# Patient Record
Sex: Female | Born: 1935
Health system: Southern US, Community
[De-identification: ages and names within clinical notes are randomized; demographics above are authoritative.]

## PROBLEM LIST (undated history)

## (undated) DIAGNOSIS — IMO0002 Reserved for concepts with insufficient information to code with codable children: Secondary | ICD-10-CM

## (undated) DIAGNOSIS — F419 Anxiety disorder, unspecified: Secondary | ICD-10-CM

## (undated) DIAGNOSIS — M1990 Unspecified osteoarthritis, unspecified site: Secondary | ICD-10-CM

## (undated) DIAGNOSIS — IMO0001 Reserved for inherently not codable concepts without codable children: Secondary | ICD-10-CM

## (undated) DIAGNOSIS — R911 Solitary pulmonary nodule: Secondary | ICD-10-CM

## (undated) DIAGNOSIS — I10 Essential (primary) hypertension: Secondary | ICD-10-CM

## (undated) DIAGNOSIS — Z9889 Other specified postprocedural states: Secondary | ICD-10-CM

## (undated) DIAGNOSIS — T8859XA Other complications of anesthesia, initial encounter: Secondary | ICD-10-CM

## (undated) DIAGNOSIS — T4145XA Adverse effect of unspecified anesthetic, initial encounter: Secondary | ICD-10-CM

## (undated) DIAGNOSIS — C801 Malignant (primary) neoplasm, unspecified: Secondary | ICD-10-CM

## (undated) DIAGNOSIS — R112 Nausea with vomiting, unspecified: Secondary | ICD-10-CM

## (undated) DIAGNOSIS — H269 Unspecified cataract: Secondary | ICD-10-CM

## (undated) HISTORY — DX: Essential (primary) hypertension: I10

## (undated) HISTORY — PX: TONSILLECTOMY: SUR1361

## (undated) HISTORY — PX: TUBAL LIGATION: SHX77

## (undated) HISTORY — DX: Reserved for inherently not codable concepts without codable children: IMO0001

## (undated) HISTORY — PX: APPENDECTOMY: SHX54

## (undated) HISTORY — DX: Hemochromatosis, unspecified: E83.119

## (undated) HISTORY — PX: EYE SURGERY: SHX253

## (undated) HISTORY — DX: Unspecified cataract: H26.9

## (undated) HISTORY — DX: Reserved for concepts with insufficient information to code with codable children: IMO0002

---

## 1979-05-04 HISTORY — PX: VEIN LIGATION: SHX2652

## 1999-06-21 ENCOUNTER — Other Ambulatory Visit: Admission: RE | Admit: 1999-06-21 | Discharge: 1999-06-21 | Payer: Self-pay | Admitting: Internal Medicine

## 2008-05-13 ENCOUNTER — Ambulatory Visit: Payer: Self-pay | Admitting: Hematology and Oncology

## 2008-06-01 LAB — CBC WITH DIFFERENTIAL/PLATELET
BASO%: 0.4 % (ref 0.0–2.0)
LYMPH%: 29.7 % (ref 14.0–48.0)
MCHC: 34.1 g/dL (ref 32.0–36.0)
MCV: 98.6 fL (ref 81.0–101.0)
MONO%: 11.3 % (ref 0.0–13.0)
Platelets: 153 10*3/uL (ref 145–400)
RBC: 4.15 10*6/uL (ref 3.70–5.32)
RDW: 13.8 % (ref 11.3–14.5)
WBC: 6 10*3/uL (ref 3.9–10.0)

## 2008-06-01 LAB — FERRITIN: Ferritin: 4484 ng/mL — ABNORMAL HIGH (ref 10–291)

## 2008-06-01 LAB — COMPREHENSIVE METABOLIC PANEL
ALT: 46 U/L — ABNORMAL HIGH (ref 0–35)
AST: 79 U/L — ABNORMAL HIGH (ref 0–37)
Albumin: 4.7 g/dL (ref 3.5–5.2)
Alkaline Phosphatase: 74 U/L (ref 39–117)
Potassium: 3.2 mEq/L — ABNORMAL LOW (ref 3.5–5.3)
Sodium: 135 mEq/L (ref 135–145)
Total Bilirubin: 1 mg/dL (ref 0.3–1.2)
Total Protein: 7.6 g/dL (ref 6.0–8.3)

## 2008-06-01 LAB — LACTATE DEHYDROGENASE: LDH: 139 U/L (ref 94–250)

## 2008-06-01 LAB — IRON AND TIBC

## 2008-06-05 ENCOUNTER — Encounter: Admission: RE | Admit: 2008-06-05 | Discharge: 2008-06-05 | Payer: Self-pay | Admitting: Hematology and Oncology

## 2008-06-17 LAB — CBC WITH DIFFERENTIAL/PLATELET
BASO%: 1.9 % (ref 0.0–2.0)
Basophils Absolute: 0.1 10*3/uL (ref 0.0–0.1)
EOS%: 0.6 % (ref 0.0–7.0)
HCT: 36.4 % (ref 34.8–46.6)
HGB: 12.6 g/dL (ref 11.6–15.9)
MCH: 33.9 pg (ref 26.0–34.0)
MCHC: 34.7 g/dL (ref 32.0–36.0)
MONO#: 0.7 10*3/uL (ref 0.1–0.9)
NEUT%: 50 % (ref 39.6–76.8)
RDW: 13.7 % (ref 11.3–14.5)
WBC: 6.2 10*3/uL (ref 3.9–10.0)
lymph#: 2.2 10*3/uL (ref 0.9–3.3)

## 2008-06-21 LAB — CBC WITH DIFFERENTIAL/PLATELET
BASO%: 0.8 % (ref 0.0–2.0)
Basophils Absolute: 0 10*3/uL (ref 0.0–0.1)
EOS%: 1 % (ref 0.0–7.0)
Eosinophils Absolute: 0.1 10*3/uL (ref 0.0–0.5)
HCT: 33.1 % — ABNORMAL LOW (ref 34.8–46.6)
HGB: 11.5 g/dL — ABNORMAL LOW (ref 11.6–15.9)
LYMPH%: 36.4 % (ref 14.0–48.0)
MCH: 33.3 pg (ref 26.0–34.0)
MCHC: 34.9 g/dL (ref 32.0–36.0)
MCV: 95.5 fL (ref 81.0–101.0)
MONO#: 0.9 10*3/uL (ref 0.1–0.9)
MONO%: 15 % — ABNORMAL HIGH (ref 0.0–13.0)
NEUT#: 2.8 10*3/uL (ref 1.5–6.5)
NEUT%: 46.8 % (ref 39.6–76.8)
Platelets: 179 10*3/uL (ref 145–400)
RBC: 3.46 10*6/uL — ABNORMAL LOW (ref 3.70–5.32)
RDW: 12.6 % (ref 11.3–14.5)
WBC: 6 10*3/uL (ref 3.9–10.0)
lymph#: 2.2 10*3/uL (ref 0.9–3.3)

## 2008-06-24 ENCOUNTER — Ambulatory Visit: Payer: Self-pay | Admitting: Hematology and Oncology

## 2008-06-24 LAB — CBC WITH DIFFERENTIAL/PLATELET
Basophils Absolute: 0 10*3/uL (ref 0.0–0.1)
EOS%: 1 % (ref 0.0–7.0)
Eosinophils Absolute: 0.1 10*3/uL (ref 0.0–0.5)
HCT: 31.6 % — ABNORMAL LOW (ref 34.8–46.6)
HGB: 11 g/dL — ABNORMAL LOW (ref 11.6–15.9)
LYMPH%: 34.6 % (ref 14.0–48.0)
MCH: 34.2 pg — ABNORMAL HIGH (ref 26.0–34.0)
MCV: 98.3 fL (ref 81.0–101.0)
MONO%: 12.8 % (ref 0.0–13.0)
NEUT#: 3.1 10*3/uL (ref 1.5–6.5)
NEUT%: 51 % (ref 39.6–76.8)
Platelets: 164 10*3/uL (ref 145–400)
RDW: 14 % (ref 11.3–14.5)

## 2008-06-24 LAB — FERRITIN: Ferritin: 3548 ng/mL — ABNORMAL HIGH (ref 10–291)

## 2008-06-28 LAB — CBC WITH DIFFERENTIAL/PLATELET
BASO%: 0.8 % (ref 0.0–2.0)
Eosinophils Absolute: 0.1 10*3/uL (ref 0.0–0.5)
LYMPH%: 29.4 % (ref 14.0–48.0)
MCHC: 33.7 g/dL (ref 32.0–36.0)
MONO#: 0.9 10*3/uL (ref 0.1–0.9)
NEUT#: 3.4 10*3/uL (ref 1.5–6.5)
Platelets: 187 10*3/uL (ref 145–400)
RBC: 3.3 10*6/uL — ABNORMAL LOW (ref 3.70–5.32)
RDW: 13.5 % (ref 11.3–14.5)
WBC: 6.3 10*3/uL (ref 3.9–10.0)
lymph#: 1.9 10*3/uL (ref 0.9–3.3)

## 2008-06-28 LAB — COMPREHENSIVE METABOLIC PANEL
ALT: 42 U/L — ABNORMAL HIGH (ref 0–35)
Albumin: 4.1 g/dL (ref 3.5–5.2)
CO2: 30 mEq/L (ref 19–32)
Glucose, Bld: 122 mg/dL — ABNORMAL HIGH (ref 70–99)
Potassium: 2.9 mEq/L — ABNORMAL LOW (ref 3.5–5.3)
Sodium: 135 mEq/L (ref 135–145)
Total Bilirubin: 0.6 mg/dL (ref 0.3–1.2)
Total Protein: 6.7 g/dL (ref 6.0–8.3)

## 2008-07-12 LAB — COMPREHENSIVE METABOLIC PANEL
BUN: 11 mg/dL (ref 6–23)
CO2: 23 mEq/L (ref 19–32)
Calcium: 9 mg/dL (ref 8.4–10.5)
Chloride: 102 mEq/L (ref 96–112)
Creatinine, Ser: 0.7 mg/dL (ref 0.40–1.20)
Glucose, Bld: 114 mg/dL — ABNORMAL HIGH (ref 70–99)

## 2008-07-12 LAB — CBC WITH DIFFERENTIAL/PLATELET
Basophils Absolute: 0.1 10*3/uL (ref 0.0–0.1)
EOS%: 1.9 % (ref 0.0–7.0)
Eosinophils Absolute: 0.1 10*3/uL (ref 0.0–0.5)
HCT: 33.3 % — ABNORMAL LOW (ref 34.8–46.6)
HGB: 11.2 g/dL — ABNORMAL LOW (ref 11.6–15.9)
MCH: 34.3 pg — ABNORMAL HIGH (ref 26.0–34.0)
MONO#: 0.6 10*3/uL (ref 0.1–0.9)
NEUT%: 45.1 % (ref 39.6–76.8)
lymph#: 2.1 10*3/uL (ref 0.9–3.3)

## 2008-07-12 LAB — FERRITIN: Ferritin: 2152 ng/mL — ABNORMAL HIGH (ref 10–291)

## 2008-07-26 LAB — CBC WITH DIFFERENTIAL/PLATELET
BASO%: 0.4 % (ref 0.0–2.0)
Eosinophils Absolute: 0.1 10*3/uL (ref 0.0–0.5)
LYMPH%: 38 % (ref 14.0–48.0)
MCHC: 33.7 g/dL (ref 32.0–36.0)
MONO#: 0.7 10*3/uL (ref 0.1–0.9)
NEUT#: 2.2 10*3/uL (ref 1.5–6.5)
RBC: 3.45 10*6/uL — ABNORMAL LOW (ref 3.70–5.32)
RDW: 15 % — ABNORMAL HIGH (ref 11.3–14.5)
WBC: 4.9 10*3/uL (ref 3.9–10.0)

## 2008-07-27 LAB — FERRITIN: Ferritin: 2594 ng/mL — ABNORMAL HIGH (ref 10–291)

## 2008-08-04 ENCOUNTER — Ambulatory Visit: Payer: Self-pay | Admitting: Hematology and Oncology

## 2008-08-09 LAB — CBC WITH DIFFERENTIAL/PLATELET
BASO%: 0.5 % (ref 0.0–2.0)
EOS%: 1.8 % (ref 0.0–7.0)
HCT: 37.8 % (ref 34.8–46.6)
LYMPH%: 29.4 % (ref 14.0–48.0)
MCH: 34.6 pg — ABNORMAL HIGH (ref 26.0–34.0)
MCHC: 33.7 g/dL (ref 32.0–36.0)
NEUT%: 54.1 % (ref 39.6–76.8)
RBC: 3.68 10*6/uL — ABNORMAL LOW (ref 3.70–5.32)
WBC: 5.6 10*3/uL (ref 3.9–10.0)
lymph#: 1.6 10*3/uL (ref 0.9–3.3)

## 2008-08-10 LAB — COMPREHENSIVE METABOLIC PANEL
BUN: 12 mg/dL (ref 6–23)
CO2: 24 mEq/L (ref 19–32)
Creatinine, Ser: 0.72 mg/dL (ref 0.40–1.20)
Glucose, Bld: 114 mg/dL — ABNORMAL HIGH (ref 70–99)
Total Bilirubin: 0.4 mg/dL (ref 0.3–1.2)
Total Protein: 6.8 g/dL (ref 6.0–8.3)

## 2008-08-10 LAB — FERRITIN: Ferritin: 1824 ng/mL — ABNORMAL HIGH (ref 10–291)

## 2008-08-22 ENCOUNTER — Ambulatory Visit: Payer: Self-pay | Admitting: Hematology and Oncology

## 2008-08-23 LAB — CBC WITH DIFFERENTIAL/PLATELET
BASO%: 0.9 % (ref 0.0–2.0)
HCT: 38.2 % (ref 34.8–46.6)
HGB: 13.1 g/dL (ref 11.6–15.9)
MCHC: 34.4 g/dL (ref 32.0–36.0)
MONO#: 0.9 10*3/uL (ref 0.1–0.9)
NEUT%: 46.8 % (ref 39.6–76.8)
WBC: 5.7 10*3/uL (ref 3.9–10.0)
lymph#: 2 10*3/uL (ref 0.9–3.3)

## 2008-09-13 LAB — CBC WITH DIFFERENTIAL/PLATELET
Basophils Absolute: 0 10*3/uL (ref 0.0–0.1)
EOS%: 0.7 % (ref 0.0–7.0)
Eosinophils Absolute: 0.1 10*3/uL (ref 0.0–0.5)
HCT: 38.6 % (ref 34.8–46.6)
HGB: 13.3 g/dL (ref 11.6–15.9)
LYMPH%: 23.9 % (ref 14.0–48.0)
MCH: 34.2 pg — ABNORMAL HIGH (ref 26.0–34.0)
MCV: 99.3 fL (ref 81.0–101.0)
MONO%: 15.2 % — ABNORMAL HIGH (ref 0.0–13.0)
NEUT#: 4.7 10*3/uL (ref 1.5–6.5)
NEUT%: 59.9 % (ref 39.6–76.8)
Platelets: 164 10*3/uL (ref 145–400)
RDW: 13.6 % (ref 11.3–14.5)

## 2008-09-27 ENCOUNTER — Ambulatory Visit: Payer: Self-pay | Admitting: Hematology and Oncology

## 2008-09-27 LAB — CBC WITH DIFFERENTIAL/PLATELET
BASO%: 0.6 % (ref 0.0–2.0)
HCT: 38.9 % (ref 34.8–46.6)
LYMPH%: 25.2 % (ref 14.0–48.0)
MCH: 34.3 pg — ABNORMAL HIGH (ref 26.0–34.0)
MCHC: 34.2 g/dL (ref 32.0–36.0)
MCV: 100.1 fL (ref 81.0–101.0)
MONO#: 1 10*3/uL — ABNORMAL HIGH (ref 0.1–0.9)
MONO%: 11.6 % (ref 0.0–13.0)
NEUT%: 62.2 % (ref 39.6–76.8)
Platelets: 153 10*3/uL (ref 145–400)
RBC: 3.89 10*6/uL (ref 3.70–5.32)
WBC: 8.6 10*3/uL (ref 3.9–10.0)

## 2008-10-11 LAB — COMPREHENSIVE METABOLIC PANEL
ALT: 32 U/L (ref 0–35)
AST: 60 U/L — ABNORMAL HIGH (ref 0–37)
Albumin: 4.1 g/dL (ref 3.5–5.2)
Alkaline Phosphatase: 100 U/L (ref 39–117)
BUN: 10 mg/dL (ref 6–23)
CO2: 24 mEq/L (ref 19–32)
Calcium: 8.8 mg/dL (ref 8.4–10.5)
Chloride: 103 mEq/L (ref 96–112)
Creatinine, Ser: 0.71 mg/dL (ref 0.40–1.20)
Glucose, Bld: 126 mg/dL — ABNORMAL HIGH (ref 70–99)
Potassium: 3.7 mEq/L (ref 3.5–5.3)
Sodium: 140 mEq/L (ref 135–145)
Total Bilirubin: 0.4 mg/dL (ref 0.3–1.2)
Total Protein: 7.1 g/dL (ref 6.0–8.3)

## 2008-10-11 LAB — CBC WITH DIFFERENTIAL/PLATELET
Eosinophils Absolute: 0.1 10*3/uL (ref 0.0–0.5)
LYMPH%: 31.6 % (ref 14.0–48.0)
MONO#: 0.8 10*3/uL (ref 0.1–0.9)
NEUT#: 3.8 10*3/uL (ref 1.5–6.5)
Platelets: 174 10*3/uL (ref 145–400)
RBC: 3.76 10*6/uL (ref 3.70–5.32)
WBC: 6.8 10*3/uL (ref 3.9–10.0)

## 2008-10-11 LAB — IRON AND TIBC
Iron: 264 ug/dL — ABNORMAL HIGH (ref 42–145)
UIBC: 23 ug/dL

## 2008-10-17 LAB — CBC WITH DIFFERENTIAL/PLATELET
BASO%: 0.8 % (ref 0.0–2.0)
Basophils Absolute: 0.1 10*3/uL (ref 0.0–0.1)
EOS%: 1 % (ref 0.0–7.0)
HCT: 35.7 % (ref 34.8–46.6)
HGB: 12.2 g/dL (ref 11.6–15.9)
MONO#: 0.9 10*3/uL (ref 0.1–0.9)
NEUT%: 60.4 % (ref 39.6–76.8)
RDW: 15 % — ABNORMAL HIGH (ref 11.3–14.5)
WBC: 7.3 10*3/uL (ref 3.9–10.0)
lymph#: 1.8 10*3/uL (ref 0.9–3.3)

## 2008-10-25 LAB — CBC WITH DIFFERENTIAL/PLATELET
Basophils Absolute: 0 10*3/uL (ref 0.0–0.1)
EOS%: 0.7 % (ref 0.0–7.0)
Eosinophils Absolute: 0 10*3/uL (ref 0.0–0.5)
HCT: 36.9 % (ref 34.8–46.6)
HGB: 12.6 g/dL (ref 11.6–15.9)
MCH: 34.5 pg — ABNORMAL HIGH (ref 25.1–34.0)
MCV: 100.6 fL (ref 79.5–101.0)
NEUT#: 3.2 10*3/uL (ref 1.5–6.5)
NEUT%: 51.3 % (ref 38.4–76.8)
RDW: 14.9 % — ABNORMAL HIGH (ref 11.2–14.5)
lymph#: 2.2 10*3/uL (ref 0.9–3.3)

## 2008-11-08 LAB — CBC WITH DIFFERENTIAL/PLATELET
EOS%: 1.3 % (ref 0.0–7.0)
Eosinophils Absolute: 0.1 10*3/uL (ref 0.0–0.5)
MCH: 32.8 pg (ref 25.1–34.0)
MCV: 97.5 fL (ref 79.5–101.0)
MONO%: 14.4 % — ABNORMAL HIGH (ref 0.0–14.0)
NEUT#: 3 10*3/uL (ref 1.5–6.5)
RBC: 3.93 10*6/uL (ref 3.70–5.45)
RDW: 13.7 % (ref 11.2–14.5)
lymph#: 2.3 10*3/uL (ref 0.9–3.3)
nRBC: 0 % (ref 0–0)

## 2008-11-18 ENCOUNTER — Ambulatory Visit: Payer: Self-pay | Admitting: Hematology and Oncology

## 2008-11-22 LAB — CBC WITH DIFFERENTIAL/PLATELET
BASO%: 1.1 % (ref 0.0–2.0)
Basophils Absolute: 0.1 10*3/uL (ref 0.0–0.1)
EOS%: 1.1 % (ref 0.0–7.0)
Eosinophils Absolute: 0.1 10*3/uL (ref 0.0–0.5)
HCT: 36.3 % (ref 34.8–46.6)
HGB: 12.3 g/dL (ref 11.6–15.9)
LYMPH%: 36.6 % (ref 14.0–49.7)
MCH: 34.5 pg — ABNORMAL HIGH (ref 25.1–34.0)
MCHC: 34 g/dL (ref 31.5–36.0)
MCV: 101.6 fL — ABNORMAL HIGH (ref 79.5–101.0)
MONO#: 0.8 10*3/uL (ref 0.1–0.9)
MONO%: 14 % (ref 0.0–14.0)
NEUT#: 2.8 10*3/uL (ref 1.5–6.5)
NEUT%: 47.2 % (ref 38.4–76.8)
Platelets: 146 10*3/uL (ref 145–400)
RBC: 3.58 10*6/uL — ABNORMAL LOW (ref 3.70–5.45)
RDW: 14.9 % — ABNORMAL HIGH (ref 11.2–14.5)
WBC: 5.9 10*3/uL (ref 3.9–10.3)
lymph#: 2.2 10*3/uL (ref 0.9–3.3)

## 2008-12-06 LAB — CBC WITH DIFFERENTIAL/PLATELET
Basophils Absolute: 0 10*3/uL (ref 0.0–0.1)
Eosinophils Absolute: 0.1 10*3/uL (ref 0.0–0.5)
HCT: 37.4 % (ref 34.8–46.6)
LYMPH%: 38.5 % (ref 14.0–49.7)
MCV: 97.4 fL (ref 79.5–101.0)
MONO#: 0.9 10*3/uL (ref 0.1–0.9)
MONO%: 14 % (ref 0.0–14.0)
NEUT#: 3 10*3/uL (ref 1.5–6.5)
NEUT%: 46.1 % (ref 38.4–76.8)
Platelets: 158 10*3/uL (ref 145–400)
WBC: 6.4 10*3/uL (ref 3.9–10.3)

## 2008-12-20 LAB — BASIC METABOLIC PANEL
CO2: 25 mEq/L (ref 19–32)
Calcium: 8.8 mg/dL (ref 8.4–10.5)
Creatinine, Ser: 0.86 mg/dL (ref 0.40–1.20)

## 2008-12-20 LAB — CBC WITH DIFFERENTIAL/PLATELET
BASO%: 0.6 % (ref 0.0–2.0)
HCT: 37 % (ref 34.8–46.6)
LYMPH%: 37.4 % (ref 14.0–49.7)
MCH: 34.4 pg — ABNORMAL HIGH (ref 25.1–34.0)
MCHC: 34.1 g/dL (ref 31.5–36.0)
MCV: 101.1 fL — ABNORMAL HIGH (ref 79.5–101.0)
MONO#: 0.7 10*3/uL (ref 0.1–0.9)
MONO%: 13.7 % (ref 0.0–14.0)
NEUT%: 47.1 % (ref 38.4–76.8)
Platelets: 149 10*3/uL (ref 145–400)

## 2008-12-20 LAB — FERRITIN: Ferritin: 1268 ng/mL — ABNORMAL HIGH (ref 10–291)

## 2009-01-03 ENCOUNTER — Ambulatory Visit: Payer: Self-pay | Admitting: Hematology and Oncology

## 2009-01-04 ENCOUNTER — Encounter: Admission: RE | Admit: 2009-01-04 | Discharge: 2009-01-04 | Payer: Self-pay | Admitting: Family Medicine

## 2009-01-17 LAB — CBC WITH DIFFERENTIAL/PLATELET
Basophils Absolute: 0 10*3/uL (ref 0.0–0.1)
Eosinophils Absolute: 0.2 10*3/uL (ref 0.0–0.5)
HCT: 40.4 % (ref 34.8–46.6)
HGB: 14 g/dL (ref 11.6–15.9)
MCH: 33.5 pg (ref 25.1–34.0)
MCV: 96.7 fL (ref 79.5–101.0)
NEUT#: 2.7 10*3/uL (ref 1.5–6.5)
NEUT%: 42.6 % (ref 38.4–76.8)
RDW: 13.3 % (ref 11.2–14.5)
lymph#: 2.6 10*3/uL (ref 0.9–3.3)

## 2009-01-31 LAB — CBC WITH DIFFERENTIAL/PLATELET
BASO%: 0.4 % (ref 0.0–2.0)
Eosinophils Absolute: 0.1 10*3/uL (ref 0.0–0.5)
HCT: 36 % (ref 34.8–46.6)
LYMPH%: 42.1 % (ref 14.0–49.7)
MCHC: 35 g/dL (ref 31.5–36.0)
MCV: 97.3 fL (ref 79.5–101.0)
MONO#: 0.8 10*3/uL (ref 0.1–0.9)
MONO%: 14.4 % — ABNORMAL HIGH (ref 0.0–14.0)
NEUT%: 40.5 % (ref 38.4–76.8)
Platelets: 149 10*3/uL (ref 145–400)
WBC: 5.4 10*3/uL (ref 3.9–10.3)

## 2009-02-10 LAB — BASIC METABOLIC PANEL
Calcium: 8.9 mg/dL (ref 8.4–10.5)
Creatinine, Ser: 0.82 mg/dL (ref 0.40–1.20)

## 2009-02-10 LAB — CBC WITH DIFFERENTIAL/PLATELET
BASO%: 0.5 % (ref 0.0–2.0)
EOS%: 2.1 % (ref 0.0–7.0)
HCT: 34.9 % (ref 34.8–46.6)
LYMPH%: 37.3 % (ref 14.0–49.7)
MCH: 35.8 pg — ABNORMAL HIGH (ref 25.1–34.0)
MCHC: 34.7 g/dL (ref 31.5–36.0)
NEUT%: 46.8 % (ref 38.4–76.8)
Platelets: 158 10*3/uL (ref 145–400)
RBC: 3.38 10*6/uL — ABNORMAL LOW (ref 3.70–5.45)

## 2009-02-10 LAB — IRON AND TIBC
Iron: 276 ug/dL — ABNORMAL HIGH (ref 42–145)
UIBC: <55 ug/dL

## 2009-02-10 LAB — FERRITIN: Ferritin: 1105 ng/mL — ABNORMAL HIGH (ref 10–291)

## 2009-02-17 ENCOUNTER — Ambulatory Visit: Payer: Self-pay | Admitting: Hematology and Oncology

## 2009-03-10 LAB — COMPREHENSIVE METABOLIC PANEL
ALT: 41 U/L — ABNORMAL HIGH (ref 0–35)
Alkaline Phosphatase: 94 U/L (ref 39–117)
Creatinine, Ser: 0.77 mg/dL (ref 0.40–1.20)
Sodium: 138 mEq/L (ref 135–145)
Total Bilirubin: 0.7 mg/dL (ref 0.3–1.2)
Total Protein: 7.1 g/dL (ref 6.0–8.3)

## 2009-03-10 LAB — CBC WITH DIFFERENTIAL/PLATELET
BASO%: 1.1 % (ref 0.0–2.0)
EOS%: 1.8 % (ref 0.0–7.0)
LYMPH%: 36.1 % (ref 14.0–49.7)
MCH: 34.8 pg — ABNORMAL HIGH (ref 25.1–34.0)
MCHC: 33.7 g/dL (ref 31.5–36.0)
MCV: 103.4 fL — ABNORMAL HIGH (ref 79.5–101.0)
MONO%: 13.9 % (ref 0.0–14.0)
Platelets: 149 10*3/uL (ref 145–400)
RBC: 3.8 10*6/uL (ref 3.70–5.45)
WBC: 5.3 10*3/uL (ref 3.9–10.3)

## 2009-03-17 ENCOUNTER — Ambulatory Visit: Payer: Self-pay | Admitting: Hematology and Oncology

## 2009-04-06 LAB — COMPREHENSIVE METABOLIC PANEL
BUN: 18 mg/dL (ref 6–23)
CO2: 23 mEq/L (ref 19–32)
Calcium: 9.3 mg/dL (ref 8.4–10.5)
Chloride: 105 mEq/L (ref 96–112)
Creatinine, Ser: 0.91 mg/dL (ref 0.40–1.20)
Glucose, Bld: 109 mg/dL — ABNORMAL HIGH (ref 70–99)

## 2009-04-06 LAB — CBC WITH DIFFERENTIAL/PLATELET
Basophils Absolute: 0 10*3/uL (ref 0.0–0.1)
HCT: 38.9 % (ref 34.8–46.6)
HGB: 13.2 g/dL (ref 11.6–15.9)
MONO#: 0.7 10*3/uL (ref 0.1–0.9)
NEUT%: 48.7 % (ref 38.4–76.8)
Platelets: 129 10*3/uL — ABNORMAL LOW (ref 145–400)
WBC: 5.2 10*3/uL (ref 3.9–10.3)
lymph#: 1.8 10*3/uL (ref 0.9–3.3)

## 2009-04-06 LAB — IRON AND TIBC: UIBC: <55 ug/dL

## 2009-04-06 LAB — FERRITIN: Ferritin: 1280 ng/mL — ABNORMAL HIGH (ref 10–291)

## 2009-04-12 ENCOUNTER — Ambulatory Visit: Payer: Self-pay | Admitting: Hematology and Oncology

## 2009-04-25 LAB — CBC WITH DIFFERENTIAL/PLATELET
Basophils Absolute: 0 10*3/uL (ref 0.0–0.1)
EOS%: 2 % (ref 0.0–7.0)
HGB: 13.8 g/dL (ref 11.6–15.9)
LYMPH%: 36.9 % (ref 14.0–49.7)
MCH: 33.3 pg (ref 25.1–34.0)
MCV: 96.1 fL (ref 79.5–101.0)
MONO%: 12.2 % (ref 0.0–14.0)
RDW: 13.2 % (ref 11.2–14.5)

## 2009-05-04 ENCOUNTER — Ambulatory Visit: Payer: Self-pay | Admitting: Oncology

## 2009-05-09 ENCOUNTER — Ambulatory Visit (HOSPITAL_COMMUNITY): Admission: RE | Admit: 2009-05-09 | Discharge: 2009-05-09 | Payer: Self-pay | Admitting: Hematology and Oncology

## 2009-05-09 LAB — CBC WITH DIFFERENTIAL/PLATELET
BASO%: 0.4 % (ref 0.0–2.0)
Basophils Absolute: 0 10*3/uL (ref 0.0–0.1)
Eosinophils Absolute: 0.1 10*3/uL (ref 0.0–0.5)
HCT: 40 % (ref 34.8–46.6)
HGB: 13.5 g/dL (ref 11.6–15.9)
LYMPH%: 34.5 % (ref 14.0–49.7)
MONO#: 0.6 10*3/uL (ref 0.1–0.9)
NEUT%: 50.4 % (ref 38.4–76.8)
Platelets: 133 10*3/uL — ABNORMAL LOW (ref 145–400)
WBC: 4.6 10*3/uL (ref 3.9–10.3)
lymph#: 1.6 10*3/uL (ref 0.9–3.3)

## 2009-05-09 LAB — COMPREHENSIVE METABOLIC PANEL
ALT: 38 U/L — ABNORMAL HIGH (ref 0–35)
BUN: 12 mg/dL (ref 6–23)
CO2: 22 mEq/L (ref 19–32)
Calcium: 9.3 mg/dL (ref 8.4–10.5)
Chloride: 105 mEq/L (ref 96–112)
Creatinine, Ser: 0.67 mg/dL (ref 0.40–1.20)
Glucose, Bld: 101 mg/dL — ABNORMAL HIGH (ref 70–99)

## 2009-05-09 LAB — AFP TUMOR MARKER: AFP-Tumor Marker: 1.4 ng/mL (ref 0.0–8.0)

## 2009-05-19 LAB — CBC WITH DIFFERENTIAL/PLATELET
Basophils Absolute: 0 10*3/uL (ref 0.0–0.1)
Eosinophils Absolute: 0.1 10*3/uL (ref 0.0–0.5)
HCT: 38 % (ref 34.8–46.6)
HGB: 13.1 g/dL (ref 11.6–15.9)
LYMPH%: 39.7 % (ref 14.0–49.7)
MCV: 96.2 fL (ref 79.5–101.0)
MONO#: 0.8 10*3/uL (ref 0.1–0.9)
MONO%: 13.4 % (ref 0.0–14.0)
NEUT#: 2.7 10*3/uL (ref 1.5–6.5)
Platelets: 146 10*3/uL (ref 145–400)

## 2009-05-19 LAB — FERRITIN: Ferritin: 1402 ng/mL — ABNORMAL HIGH (ref 10–291)

## 2009-05-19 LAB — COMPREHENSIVE METABOLIC PANEL
Albumin: 4.4 g/dL (ref 3.5–5.2)
Alkaline Phosphatase: 84 U/L (ref 39–117)
BUN: 17 mg/dL (ref 6–23)
CO2: 22 mEq/L (ref 19–32)
Glucose, Bld: 100 mg/dL — ABNORMAL HIGH (ref 70–99)
Potassium: 4 mEq/L (ref 3.5–5.3)
Total Bilirubin: 0.8 mg/dL (ref 0.3–1.2)

## 2009-05-19 LAB — LACTATE DEHYDROGENASE: LDH: 148 U/L (ref 94–250)

## 2009-05-19 LAB — IRON AND TIBC: UIBC: <55 ug/dL

## 2009-05-30 LAB — CBC WITH DIFFERENTIAL/PLATELET
Basophils Absolute: 0 10*3/uL (ref 0.0–0.1)
Eosinophils Absolute: 0.1 10*3/uL (ref 0.0–0.5)
HGB: 11.9 g/dL (ref 11.6–15.9)
LYMPH%: 37 % (ref 14.0–49.7)
MONO#: 0.9 10*3/uL (ref 0.1–0.9)
NEUT#: 3 10*3/uL (ref 1.5–6.5)
Platelets: 147 10*3/uL (ref 145–400)
RBC: 3.52 10*6/uL — ABNORMAL LOW (ref 3.70–5.45)
RDW: 14 % (ref 11.2–14.5)
WBC: 6.4 10*3/uL (ref 3.9–10.3)
nRBC: 0 % (ref 0–0)

## 2009-06-06 ENCOUNTER — Ambulatory Visit: Payer: Self-pay | Admitting: Oncology

## 2009-06-06 LAB — CBC WITH DIFFERENTIAL/PLATELET
Basophils Absolute: 0 10*3/uL (ref 0.0–0.1)
EOS%: 1.2 % (ref 0.0–7.0)
Eosinophils Absolute: 0.1 10*3/uL (ref 0.0–0.5)
HGB: 13.9 g/dL (ref 11.6–15.9)
LYMPH%: 30.1 % (ref 14.0–49.7)
MCV: 100.5 fL (ref 79.5–101.0)
MONO#: 1 10*3/uL — ABNORMAL HIGH (ref 0.1–0.9)
MONO%: 15.1 % — ABNORMAL HIGH (ref 0.0–14.0)
NEUT#: 3.6 10*3/uL (ref 1.5–6.5)
Platelets: 164 10*3/uL (ref 145–400)
RBC: 4.1 10*6/uL (ref 3.70–5.45)
RDW: 15.9 % — ABNORMAL HIGH (ref 11.2–14.5)
WBC: 6.8 10*3/uL (ref 3.9–10.3)
nRBC: 1 % — ABNORMAL HIGH (ref 0–0)

## 2009-06-13 LAB — COMPREHENSIVE METABOLIC PANEL
ALT: 31 U/L (ref 0–35)
CO2: 21 mEq/L (ref 19–32)
Calcium: 9.2 mg/dL (ref 8.4–10.5)
Chloride: 104 mEq/L (ref 96–112)
Glucose, Bld: 93 mg/dL (ref 70–99)
Sodium: 141 mEq/L (ref 135–145)
Total Protein: 6.6 g/dL (ref 6.0–8.3)

## 2009-06-13 LAB — CBC WITH DIFFERENTIAL/PLATELET
BASO%: 0.4 % (ref 0.0–2.0)
EOS%: 2.1 % (ref 0.0–7.0)
MCH: 33.4 pg (ref 25.1–34.0)
MCHC: 32.7 g/dL (ref 31.5–36.0)
MONO#: 0.7 10*3/uL (ref 0.1–0.9)
RBC: 3.83 10*6/uL (ref 3.70–5.45)
RDW: 16.6 % — ABNORMAL HIGH (ref 11.2–14.5)
WBC: 5.3 10*3/uL (ref 3.9–10.3)
lymph#: 2.2 10*3/uL (ref 0.9–3.3)
nRBC: 0 % (ref 0–0)

## 2009-06-13 LAB — LACTATE DEHYDROGENASE: LDH: 161 U/L (ref 94–250)

## 2009-06-13 LAB — IRON AND TIBC: Iron: 262 ug/dL — ABNORMAL HIGH (ref 42–145)

## 2009-06-20 LAB — CBC WITH DIFFERENTIAL/PLATELET
Basophils Absolute: 0 10*3/uL (ref 0.0–0.1)
EOS%: 1.3 % (ref 0.0–7.0)
Eosinophils Absolute: 0.1 10*3/uL (ref 0.0–0.5)
HGB: 12 g/dL (ref 11.6–15.9)
MCV: 102.8 fL — ABNORMAL HIGH (ref 79.5–101.0)
MONO%: 12.6 % (ref 0.0–14.0)
NEUT#: 2.5 10*3/uL (ref 1.5–6.5)
RBC: 3.58 10*6/uL — ABNORMAL LOW (ref 3.70–5.45)
RDW: 15.3 % — ABNORMAL HIGH (ref 11.2–14.5)
lymph#: 2.3 10*3/uL (ref 0.9–3.3)

## 2009-06-27 LAB — CBC WITH DIFFERENTIAL/PLATELET
Basophils Absolute: 0 10*3/uL (ref 0.0–0.1)
Eosinophils Absolute: 0.1 10*3/uL (ref 0.0–0.5)
HGB: 12 g/dL (ref 11.6–15.9)
MCV: 103.1 fL — ABNORMAL HIGH (ref 79.5–101.0)
MONO#: 0.8 10*3/uL (ref 0.1–0.9)
NEUT#: 2.1 10*3/uL (ref 1.5–6.5)
Platelets: 154 10*3/uL (ref 145–400)
RBC: 3.57 10*6/uL — ABNORMAL LOW (ref 3.70–5.45)
RDW: 14.6 % — ABNORMAL HIGH (ref 11.2–14.5)
WBC: 5.4 10*3/uL (ref 3.9–10.3)
nRBC: 0 % (ref 0–0)

## 2009-07-04 LAB — CBC WITH DIFFERENTIAL/PLATELET
BASO%: 0.5 % (ref 0.0–2.0)
EOS%: 2 % (ref 0.0–7.0)
HCT: 34.8 % (ref 34.8–46.6)
MCH: 33.4 pg (ref 25.1–34.0)
MCHC: 33 g/dL (ref 31.5–36.0)
MONO#: 0.6 10*3/uL (ref 0.1–0.9)
NEUT%: 47.1 % (ref 38.4–76.8)
RDW: 14.4 % (ref 11.2–14.5)
WBC: 5.6 10*3/uL (ref 3.9–10.3)
lymph#: 2.2 10*3/uL (ref 0.9–3.3)

## 2009-07-04 LAB — COMPREHENSIVE METABOLIC PANEL
ALT: 27 U/L (ref 0–35)
Albumin: 4.3 g/dL (ref 3.5–5.2)
CO2: 24 mEq/L (ref 19–32)
Calcium: 9 mg/dL (ref 8.4–10.5)
Chloride: 103 mEq/L (ref 96–112)
Creatinine, Ser: 0.83 mg/dL (ref 0.40–1.20)

## 2009-07-04 LAB — LACTATE DEHYDROGENASE: LDH: 194 U/L (ref 94–250)

## 2009-07-07 ENCOUNTER — Ambulatory Visit: Payer: Self-pay | Admitting: Oncology

## 2009-07-11 LAB — CBC WITH DIFFERENTIAL/PLATELET
Basophils Absolute: 0.2 10*3/uL — ABNORMAL HIGH (ref 0.0–0.1)
EOS%: 2.3 % (ref 0.0–7.0)
Eosinophils Absolute: 0.1 10*3/uL (ref 0.0–0.5)
HCT: 36.8 % (ref 34.8–46.6)
HGB: 12.3 g/dL (ref 11.6–15.9)
MONO#: 1 10*3/uL — ABNORMAL HIGH (ref 0.1–0.9)
NEUT#: 2.5 10*3/uL (ref 1.5–6.5)
NEUT%: 40.4 % (ref 38.4–76.8)
RDW: 16.5 % — ABNORMAL HIGH (ref 11.2–14.5)
WBC: 6.2 10*3/uL (ref 3.9–10.3)
lymph#: 2.4 10*3/uL (ref 0.9–3.3)

## 2009-07-18 LAB — CBC WITH DIFFERENTIAL/PLATELET
Eosinophils Absolute: 0.1 10*3/uL (ref 0.0–0.5)
HCT: 39.7 % (ref 34.8–46.6)
LYMPH%: 42.2 % (ref 14.0–49.7)
MONO#: 0.8 10*3/uL (ref 0.1–0.9)
NEUT#: 2.6 10*3/uL (ref 1.5–6.5)
NEUT%: 42.7 % (ref 38.4–76.8)
Platelets: 149 10*3/uL (ref 145–400)
RBC: 3.82 10*6/uL (ref 3.70–5.45)
WBC: 6 10*3/uL (ref 3.9–10.3)
nRBC: 0 % (ref 0–0)

## 2009-07-25 LAB — CBC WITH DIFFERENTIAL/PLATELET
Basophils Absolute: 0 10*3/uL (ref 0.0–0.1)
EOS%: 2 % (ref 0.0–7.0)
Eosinophils Absolute: 0.1 10*3/uL (ref 0.0–0.5)
HCT: 36.5 % (ref 34.8–46.6)
HGB: 11.9 g/dL (ref 11.6–15.9)
MCH: 34.8 pg — ABNORMAL HIGH (ref 25.1–34.0)
MCV: 107 fL — ABNORMAL HIGH (ref 79.5–101.0)
MONO%: 14 % (ref 0.0–14.0)
NEUT#: 2.1 10*3/uL (ref 1.5–6.5)
NEUT%: 47.8 % (ref 38.4–76.8)
RDW: 17.3 % — ABNORMAL HIGH (ref 11.2–14.5)

## 2009-07-25 LAB — COMPREHENSIVE METABOLIC PANEL
Albumin: 4.4 g/dL (ref 3.5–5.2)
Alkaline Phosphatase: 66 U/L (ref 39–117)
BUN: 14 mg/dL (ref 6–23)
Calcium: 9.6 mg/dL (ref 8.4–10.5)
Chloride: 105 mEq/L (ref 96–112)
Creatinine, Ser: 0.78 mg/dL (ref 0.40–1.20)
Glucose, Bld: 98 mg/dL (ref 70–99)
Potassium: 4.3 mEq/L (ref 3.5–5.3)

## 2009-07-27 ENCOUNTER — Emergency Department (HOSPITAL_COMMUNITY): Admission: EM | Admit: 2009-07-27 | Discharge: 2009-07-27 | Payer: Self-pay | Admitting: Emergency Medicine

## 2009-08-08 ENCOUNTER — Ambulatory Visit: Payer: Self-pay | Admitting: Oncology

## 2009-08-08 LAB — CBC WITH DIFFERENTIAL/PLATELET
BASO%: 0.5 % (ref 0.0–2.0)
EOS%: 2.3 % (ref 0.0–7.0)
HCT: 39.6 % (ref 34.8–46.6)
MCH: 33.3 pg (ref 25.1–34.0)
MCHC: 33.1 g/dL (ref 31.5–36.0)
NEUT%: 48 % (ref 38.4–76.8)
RBC: 3.93 10*6/uL (ref 3.70–5.45)
RDW: 14.5 % (ref 11.2–14.5)
lymph#: 2.3 10*3/uL (ref 0.9–3.3)

## 2009-08-22 LAB — CBC WITH DIFFERENTIAL/PLATELET
EOS%: 1.9 % (ref 0.0–7.0)
Eosinophils Absolute: 0.1 10*3/uL (ref 0.0–0.5)
MCV: 103.4 fL — ABNORMAL HIGH (ref 79.5–101.0)
MONO%: 11.5 % (ref 0.0–14.0)
NEUT#: 2.8 10*3/uL (ref 1.5–6.5)
RBC: 4.07 10*6/uL (ref 3.70–5.45)
RDW: 14.3 % (ref 11.2–14.5)

## 2009-08-22 LAB — COMPREHENSIVE METABOLIC PANEL
ALT: 18 U/L (ref 0–35)
AST: 32 U/L (ref 0–37)
Alkaline Phosphatase: 98 U/L (ref 39–117)
CO2: 24 mEq/L (ref 19–32)
Sodium: 138 mEq/L (ref 135–145)
Total Bilirubin: 0.5 mg/dL (ref 0.3–1.2)
Total Protein: 7.7 g/dL (ref 6.0–8.3)

## 2009-08-22 LAB — LACTATE DEHYDROGENASE: LDH: 143 U/L (ref 94–250)

## 2009-08-29 LAB — CBC WITH DIFFERENTIAL/PLATELET
Eosinophils Absolute: 0.1 10*3/uL (ref 0.0–0.5)
MONO#: 0.7 10*3/uL (ref 0.1–0.9)
NEUT#: 3 10*3/uL (ref 1.5–6.5)
RBC: 3.6 10*6/uL — ABNORMAL LOW (ref 3.70–5.45)
RDW: 14.8 % — ABNORMAL HIGH (ref 11.2–14.5)
WBC: 5.6 10*3/uL (ref 3.9–10.3)

## 2009-09-05 ENCOUNTER — Ambulatory Visit: Payer: Self-pay | Admitting: Oncology

## 2009-09-05 LAB — CBC WITH DIFFERENTIAL/PLATELET
BASO%: 0.4 % (ref 0.0–2.0)
Basophils Absolute: 0 10*3/uL (ref 0.0–0.1)
Eosinophils Absolute: 0.1 10*3/uL (ref 0.0–0.5)
HCT: 36.1 % (ref 34.8–46.6)
LYMPH%: 38.9 % (ref 14.0–49.7)
MCV: 100.3 fL (ref 79.5–101.0)
MONO%: 16.7 % — ABNORMAL HIGH (ref 0.0–14.0)
Platelets: 160 10*3/uL (ref 145–400)
RBC: 3.6 10*6/uL — ABNORMAL LOW (ref 3.70–5.45)
RDW: 14.4 % (ref 11.2–14.5)
WBC: 5.7 10*3/uL (ref 3.9–10.3)
nRBC: 0 % (ref 0–0)

## 2009-09-22 LAB — CBC WITH DIFFERENTIAL/PLATELET
BASO%: 2.5 % — ABNORMAL HIGH (ref 0.0–2.0)
Basophils Absolute: 0.1 10*3/uL (ref 0.0–0.1)
HCT: 39.6 % (ref 34.8–46.6)
LYMPH%: 36.9 % (ref 14.0–49.7)
MCH: 35.3 pg — ABNORMAL HIGH (ref 25.1–34.0)
MCV: 103.9 fL — ABNORMAL HIGH (ref 79.5–101.0)
MONO%: 14 % (ref 0.0–14.0)
lymph#: 1.9 10*3/uL (ref 0.9–3.3)

## 2009-09-22 LAB — COMPREHENSIVE METABOLIC PANEL
Calcium: 9.7 mg/dL (ref 8.4–10.5)
Chloride: 100 mEq/L (ref 96–112)
Creatinine, Ser: 0.72 mg/dL (ref 0.40–1.20)
Glucose, Bld: 97 mg/dL (ref 70–99)
Total Bilirubin: 0.6 mg/dL (ref 0.3–1.2)

## 2009-10-06 ENCOUNTER — Ambulatory Visit: Payer: Self-pay | Admitting: Oncology

## 2009-10-06 LAB — CBC WITH DIFFERENTIAL/PLATELET
Basophils Absolute: 0 10*3/uL (ref 0.0–0.1)
EOS%: 2.2 % (ref 0.0–7.0)
MCHC: 33.8 g/dL (ref 31.5–36.0)
MCV: 103.7 fL — ABNORMAL HIGH (ref 79.5–101.0)
MONO#: 0.7 10*3/uL (ref 0.1–0.9)
NEUT#: 2.3 10*3/uL (ref 1.5–6.5)
RBC: 3.75 10*6/uL (ref 3.70–5.45)
RDW: 14.5 % (ref 11.2–14.5)
WBC: 5.3 10*3/uL (ref 3.9–10.3)

## 2009-10-20 LAB — CBC WITH DIFFERENTIAL/PLATELET
EOS%: 1.9 % (ref 0.0–7.0)
HGB: 13 g/dL (ref 11.6–15.9)
LYMPH%: 42.5 % (ref 14.0–49.7)
MCH: 33.3 pg (ref 25.1–34.0)
NEUT%: 40.3 % (ref 38.4–76.8)
WBC: 5.8 10*3/uL (ref 3.9–10.3)
lymph#: 2.5 10*3/uL (ref 0.9–3.3)

## 2009-10-31 ENCOUNTER — Ambulatory Visit: Payer: Self-pay | Admitting: Oncology

## 2009-11-03 LAB — CBC WITH DIFFERENTIAL/PLATELET
Basophils Absolute: 0 10*3/uL (ref 0.0–0.1)
Eosinophils Absolute: 0.1 10*3/uL (ref 0.0–0.5)
HCT: 37 % (ref 34.8–46.6)
MCH: 34.1 pg — ABNORMAL HIGH (ref 25.1–34.0)
MCV: 101.6 fL — ABNORMAL HIGH (ref 79.5–101.0)
MONO#: 0.8 10*3/uL (ref 0.1–0.9)
MONO%: 14.5 % — ABNORMAL HIGH (ref 0.0–14.0)
NEUT%: 46.4 % (ref 38.4–76.8)
Platelets: 185 10*3/uL (ref 145–400)
WBC: 5.3 10*3/uL (ref 3.9–10.3)

## 2009-11-03 LAB — FERRITIN: Ferritin: 47 ng/mL (ref 10–291)

## 2009-11-03 LAB — LACTATE DEHYDROGENASE: LDH: 130 U/L (ref 94–250)

## 2009-11-03 LAB — COMPREHENSIVE METABOLIC PANEL
ALT: 19 U/L (ref 0–35)
BUN: 16 mg/dL (ref 6–23)
CO2: 26 mEq/L (ref 19–32)
Total Protein: 7.1 g/dL (ref 6.0–8.3)

## 2009-11-17 LAB — CBC WITH DIFFERENTIAL/PLATELET
BASO%: 0.6 % (ref 0.0–2.0)
HGB: 11.6 g/dL (ref 11.6–15.9)
MCH: 34.3 pg — ABNORMAL HIGH (ref 25.1–34.0)
MCHC: 34.3 g/dL (ref 31.5–36.0)
MCV: 99.9 fL (ref 79.5–101.0)
MONO%: 18.3 % — ABNORMAL HIGH (ref 0.0–14.0)
NEUT#: 2.4 10*3/uL (ref 1.5–6.5)
NEUT%: 44.3 % (ref 38.4–76.8)
RBC: 3.4 10*6/uL — ABNORMAL LOW (ref 3.70–5.45)
RDW: 13.6 % (ref 11.2–14.5)
WBC: 5.5 10*3/uL (ref 3.9–10.3)
lymph#: 2 10*3/uL (ref 0.9–3.3)

## 2009-12-01 ENCOUNTER — Ambulatory Visit: Payer: Self-pay | Admitting: Oncology

## 2009-12-01 LAB — CBC WITH DIFFERENTIAL/PLATELET
BASO%: 0.3 % (ref 0.0–2.0)
Basophils Absolute: 0 10*3/uL (ref 0.0–0.1)
HCT: 35.6 % (ref 34.8–46.6)
HGB: 11.6 g/dL (ref 11.6–15.9)
MCH: 30.8 pg (ref 25.1–34.0)
RBC: 3.77 10*6/uL (ref 3.70–5.45)
WBC: 6.1 10*3/uL (ref 3.9–10.3)

## 2009-12-15 LAB — CBC WITH DIFFERENTIAL/PLATELET
BASO%: 0.2 % (ref 0.0–2.0)
Basophils Absolute: 0 10*3/uL (ref 0.0–0.1)
Eosinophils Absolute: 0.1 10*3/uL (ref 0.0–0.5)
HGB: 12.1 g/dL (ref 11.6–15.9)
LYMPH%: 34.2 % (ref 14.0–49.7)
MCH: 32 pg (ref 25.1–34.0)
MCV: 95.6 fL (ref 79.5–101.0)
MONO#: 0.8 10*3/uL (ref 0.1–0.9)
NEUT#: 2.7 10*3/uL (ref 1.5–6.5)
NEUT%: 48.8 % (ref 38.4–76.8)
Platelets: 189 10*3/uL (ref 145–400)
RDW: 14.4 % (ref 11.2–14.5)
WBC: 5.6 10*3/uL (ref 3.9–10.3)

## 2009-12-15 LAB — COMPREHENSIVE METABOLIC PANEL
AST: 38 U/L — ABNORMAL HIGH (ref 0–37)
BUN: 18 mg/dL (ref 6–23)
Calcium: 9.5 mg/dL (ref 8.4–10.5)
Chloride: 102 mEq/L (ref 96–112)
Creatinine, Ser: 0.78 mg/dL (ref 0.40–1.20)
Potassium: 4.5 mEq/L (ref 3.5–5.3)
Total Bilirubin: 0.5 mg/dL (ref 0.3–1.2)
Total Protein: 7.4 g/dL (ref 6.0–8.3)

## 2009-12-15 LAB — FERRITIN: Ferritin: 35 ng/mL (ref 10–291)

## 2009-12-29 LAB — CBC WITH DIFFERENTIAL/PLATELET
Basophils Absolute: 0 10*3/uL (ref 0.0–0.1)
Eosinophils Absolute: 0.1 10*3/uL (ref 0.0–0.5)
LYMPH%: 40.9 % (ref 14.0–49.7)
MCV: 91.7 fL (ref 79.5–101.0)
MONO#: 0.8 10*3/uL (ref 0.1–0.9)
NEUT%: 41.6 % (ref 38.4–76.8)
Platelets: 155 10*3/uL (ref 145–400)
RDW: 14.3 % (ref 11.2–14.5)
lymph#: 2.2 10*3/uL (ref 0.9–3.3)

## 2010-01-25 ENCOUNTER — Ambulatory Visit: Payer: Self-pay | Admitting: Oncology

## 2010-01-26 LAB — CBC WITH DIFFERENTIAL/PLATELET
BASO%: 0.3 % (ref 0.0–2.0)
HCT: 38.4 % (ref 34.8–46.6)
HGB: 13 g/dL (ref 11.6–15.9)
LYMPH%: 38.4 % (ref 14.0–49.7)
MCHC: 33.9 g/dL (ref 31.5–36.0)
MONO%: 15.5 % — ABNORMAL HIGH (ref 0.0–14.0)
NEUT#: 2.5 10*3/uL (ref 1.5–6.5)
NEUT%: 43.1 % (ref 38.4–76.8)
RBC: 4.27 10*6/uL (ref 3.70–5.45)
WBC: 5.9 10*3/uL (ref 3.9–10.3)

## 2010-02-22 LAB — CBC WITH DIFFERENTIAL/PLATELET
BASO%: 0.4 % (ref 0.0–2.0)
Basophils Absolute: 0 10*3/uL (ref 0.0–0.1)
HCT: 35.4 % (ref 34.8–46.6)
MCHC: 33.4 g/dL (ref 31.5–36.0)
MCV: 92.5 fL (ref 79.5–101.0)
MONO#: 0.8 10*3/uL (ref 0.1–0.9)
lymph#: 1.8 10*3/uL (ref 0.9–3.3)

## 2010-02-22 LAB — FERRITIN: Ferritin: 25 ng/mL (ref 10–291)

## 2010-03-21 ENCOUNTER — Ambulatory Visit: Payer: Self-pay | Admitting: Oncology

## 2010-03-23 LAB — CBC WITH DIFFERENTIAL/PLATELET
BASO%: 1 % (ref 0.0–2.0)
EOS%: 2.2 % (ref 0.0–7.0)
Eosinophils Absolute: 0.1 10*3/uL (ref 0.0–0.5)
HCT: 39.2 % (ref 34.8–46.6)
HGB: 12.9 g/dL (ref 11.6–15.9)
MCH: 30.5 pg (ref 25.1–34.0)
MCHC: 32.9 g/dL (ref 31.5–36.0)
MONO%: 15.1 % — ABNORMAL HIGH (ref 0.0–14.0)
WBC: 6.4 10*3/uL (ref 3.9–10.3)

## 2010-04-20 ENCOUNTER — Ambulatory Visit: Payer: Self-pay | Admitting: Oncology

## 2010-04-20 LAB — COMPREHENSIVE METABOLIC PANEL
ALT: 18 U/L (ref 0–35)
Albumin: 4.4 g/dL (ref 3.5–5.2)
Alkaline Phosphatase: 70 U/L (ref 39–117)
BUN: 16 mg/dL (ref 6–23)
CO2: 23 mEq/L (ref 19–32)
Chloride: 104 mEq/L (ref 96–112)
Sodium: 139 mEq/L (ref 135–145)

## 2010-04-20 LAB — CBC WITH DIFFERENTIAL/PLATELET
BASO%: 0.4 % (ref 0.0–2.0)
Basophils Absolute: 0 10*3/uL (ref 0.0–0.1)
EOS%: 2.6 % (ref 0.0–7.0)
MCHC: 32.8 g/dL (ref 31.5–36.0)
MCV: 92.1 fL (ref 79.5–101.0)
MONO%: 13.1 % (ref 0.0–14.0)
NEUT#: 2.6 10*3/uL (ref 1.5–6.5)
NEUT%: 53.2 % (ref 38.4–76.8)
RDW: 15.9 % — ABNORMAL HIGH (ref 11.2–14.5)
WBC: 5 10*3/uL (ref 3.9–10.3)

## 2010-04-20 LAB — FERRITIN: Ferritin: 22 ng/mL (ref 10–291)

## 2010-05-18 LAB — CBC WITH DIFFERENTIAL/PLATELET
BASO%: 0.4 % (ref 0.0–2.0)
Basophils Absolute: 0 10*3/uL (ref 0.0–0.1)
EOS%: 2.5 % (ref 0.0–7.0)
Eosinophils Absolute: 0.1 10*3/uL (ref 0.0–0.5)
HCT: 39.6 % (ref 34.8–46.6)
MCH: 30.6 pg (ref 25.1–34.0)
MCV: 92.8 fL (ref 79.5–101.0)
MONO#: 0.7 10*3/uL (ref 0.1–0.9)
NEUT%: 50.5 % (ref 38.4–76.8)
Platelets: 169 10*3/uL (ref 145–400)
RDW: 17.1 % — ABNORMAL HIGH (ref 11.2–14.5)
WBC: 5.8 10*3/uL (ref 3.9–10.3)
lymph#: 1.9 10*3/uL (ref 0.9–3.3)

## 2010-07-10 ENCOUNTER — Ambulatory Visit: Payer: Self-pay | Admitting: Oncology

## 2010-07-12 LAB — CBC WITH DIFFERENTIAL/PLATELET
BASO%: 0.3 % (ref 0.0–2.0)
Eosinophils Absolute: 0.2 10*3/uL (ref 0.0–0.5)
HCT: 36.1 % (ref 34.8–46.6)
LYMPH%: 31 % (ref 14.0–49.7)
MONO#: 1.1 10*3/uL — ABNORMAL HIGH (ref 0.1–0.9)
NEUT#: 3.4 10*3/uL (ref 1.5–6.5)
Platelets: 179 10*3/uL (ref 145–400)
RBC: 3.99 10*6/uL (ref 3.70–5.45)
WBC: 6.7 10*3/uL (ref 3.9–10.3)
lymph#: 2.1 10*3/uL (ref 0.9–3.3)
nRBC: 0 % (ref 0–0)

## 2010-08-08 LAB — CBC WITH DIFFERENTIAL/PLATELET
BASO%: 0.4 % (ref 0.0–2.0)
Basophils Absolute: 0 10*3/uL (ref 0.0–0.1)
Eosinophils Absolute: 0.1 10*3/uL (ref 0.0–0.5)
MCH: 31.3 pg (ref 25.1–34.0)
MCHC: 33.5 g/dL (ref 31.5–36.0)
MCV: 93.6 fL (ref 79.5–101.0)
MONO#: 0.9 10*3/uL (ref 0.1–0.9)
MONO%: 14.7 % — ABNORMAL HIGH (ref 0.0–14.0)
Platelets: 205 10*3/uL (ref 145–400)
RBC: 3.99 10*6/uL (ref 3.70–5.45)
RDW: 19.3 % — ABNORMAL HIGH (ref 11.2–14.5)
WBC: 6.1 10*3/uL (ref 3.9–10.3)
lymph#: 1.8 10*3/uL (ref 0.9–3.3)

## 2010-08-08 LAB — FERRITIN: Ferritin: 30 ng/mL (ref 10–291)

## 2010-08-08 LAB — IRON AND TIBC
%SAT: 22 % (ref 20–55)
UIBC: 305 ug/dL

## 2010-08-30 ENCOUNTER — Ambulatory Visit: Payer: Self-pay | Admitting: Oncology

## 2010-09-05 ENCOUNTER — Ambulatory Visit (HOSPITAL_BASED_OUTPATIENT_CLINIC_OR_DEPARTMENT_OTHER): Payer: Medicare Other | Admitting: Oncology

## 2010-09-05 LAB — CBC WITH DIFFERENTIAL/PLATELET
BASO%: 0.3 % (ref 0.0–2.0)
Basophils Absolute: 0 10*3/uL (ref 0.0–0.1)
EOS%: 1.5 % (ref 0.0–7.0)
Eosinophils Absolute: 0.1 10*3/uL (ref 0.0–0.5)
HCT: 39.6 % (ref 34.8–46.6)
HGB: 13.2 g/dL (ref 11.6–15.9)
LYMPH%: 34 % (ref 14.0–49.7)
MCH: 30.6 pg (ref 25.1–34.0)
MCHC: 33.3 g/dL (ref 31.5–36.0)
MCV: 91.9 fL (ref 79.5–101.0)
MONO#: 1 10*3/uL — ABNORMAL HIGH (ref 0.1–0.9)
MONO%: 15.7 % — ABNORMAL HIGH (ref 0.0–14.0)
NEUT#: 2.9 10*3/uL (ref 1.5–6.5)
NEUT%: 48.5 % (ref 38.4–76.8)
Platelets: 161 10*3/uL (ref 145–400)
RBC: 4.31 10*6/uL (ref 3.70–5.45)
RDW: 17.7 % — ABNORMAL HIGH (ref 11.2–14.5)
WBC: 6.1 10*3/uL (ref 3.9–10.3)
lymph#: 2.1 10*3/uL (ref 0.9–3.3)

## 2010-09-05 LAB — FERRITIN: Ferritin: 29 ng/mL (ref 10–291)

## 2010-10-04 ENCOUNTER — Ambulatory Visit (HOSPITAL_BASED_OUTPATIENT_CLINIC_OR_DEPARTMENT_OTHER): Payer: Medicare Other | Admitting: Oncology

## 2010-10-04 LAB — COMPREHENSIVE METABOLIC PANEL
ALT: 22 U/L (ref 0–35)
BUN: 18 mg/dL (ref 6–23)
CO2: 23 mEq/L (ref 19–32)
Chloride: 101 mEq/L (ref 96–112)
Creatinine, Ser: 0.79 mg/dL (ref 0.40–1.20)
Sodium: 139 mEq/L (ref 135–145)
Total Bilirubin: 0.6 mg/dL (ref 0.3–1.2)

## 2010-10-04 LAB — IRON AND TIBC: TIBC: 389 ug/dL (ref 250–470)

## 2010-10-04 LAB — CBC WITH DIFFERENTIAL/PLATELET
HCT: 35.5 % (ref 34.8–46.6)
HGB: 11.6 g/dL (ref 11.6–15.9)
LYMPH%: 32.5 % (ref 14.0–49.7)
MCH: 30.9 pg (ref 25.1–34.0)
MCHC: 32.8 g/dL (ref 31.5–36.0)
NEUT#: 3.2 10*3/uL (ref 1.5–6.5)
NEUT%: 51.8 % (ref 38.4–76.8)
Platelets: 183 10*3/uL (ref 145–400)
RDW: 18.6 % — ABNORMAL HIGH (ref 11.2–14.5)
WBC: 6.2 10*3/uL (ref 3.9–10.3)
lymph#: 2 10*3/uL (ref 0.9–3.3)

## 2010-10-04 LAB — LACTATE DEHYDROGENASE: LDH: 155 U/L (ref 94–250)

## 2010-11-02 ENCOUNTER — Other Ambulatory Visit (HOSPITAL_COMMUNITY): Payer: Self-pay | Admitting: Oncology

## 2010-11-02 ENCOUNTER — Encounter (HOSPITAL_BASED_OUTPATIENT_CLINIC_OR_DEPARTMENT_OTHER): Payer: Medicare Other | Admitting: Oncology

## 2010-11-02 DIAGNOSIS — D649 Anemia, unspecified: Secondary | ICD-10-CM

## 2010-11-02 LAB — CBC WITH DIFFERENTIAL/PLATELET
Basophils Absolute: 0 10*3/uL (ref 0.0–0.1)
EOS%: 1.4 % (ref 0.0–7.0)
Eosinophils Absolute: 0.1 10*3/uL (ref 0.0–0.5)
HCT: 37.3 % (ref 34.8–46.6)
HGB: 12.2 g/dL (ref 11.6–15.9)
MCH: 30.4 pg (ref 25.1–34.0)
MONO#: 0.7 10*3/uL (ref 0.1–0.9)
NEUT#: 2.7 10*3/uL (ref 1.5–6.5)
NEUT%: 54.8 % (ref 38.4–76.8)
RDW: 16.9 % — ABNORMAL HIGH (ref 11.2–14.5)
lymph#: 1.5 10*3/uL (ref 0.9–3.3)

## 2010-12-07 ENCOUNTER — Other Ambulatory Visit (HOSPITAL_COMMUNITY): Payer: Self-pay | Admitting: Oncology

## 2010-12-07 ENCOUNTER — Encounter (HOSPITAL_BASED_OUTPATIENT_CLINIC_OR_DEPARTMENT_OTHER): Payer: Medicare Other | Admitting: Oncology

## 2010-12-07 DIAGNOSIS — D649 Anemia, unspecified: Secondary | ICD-10-CM

## 2010-12-07 LAB — CBC WITH DIFFERENTIAL/PLATELET
Basophils Absolute: 0 10*3/uL (ref 0.0–0.1)
Eosinophils Absolute: 0.2 10*3/uL (ref 0.0–0.5)
HCT: 38.4 % (ref 34.8–46.6)
HGB: 12.8 g/dL (ref 11.6–15.9)
LYMPH%: 31.2 % (ref 14.0–49.7)
MCV: 93.8 fL (ref 79.5–101.0)
MONO#: 0.8 10*3/uL (ref 0.1–0.9)
MONO%: 13.8 % (ref 0.0–14.0)
NEUT#: 2.9 10*3/uL (ref 1.5–6.5)
Platelets: 175 10*3/uL (ref 145–400)
RBC: 4.09 10*6/uL (ref 3.70–5.45)
WBC: 5.5 10*3/uL (ref 3.9–10.3)

## 2010-12-07 LAB — FERRITIN: Ferritin: 26 ng/mL (ref 10–291)

## 2011-01-04 ENCOUNTER — Encounter (HOSPITAL_BASED_OUTPATIENT_CLINIC_OR_DEPARTMENT_OTHER): Payer: Medicare Other | Admitting: Oncology

## 2011-01-04 ENCOUNTER — Other Ambulatory Visit (HOSPITAL_COMMUNITY): Payer: Self-pay | Admitting: Oncology

## 2011-01-04 LAB — CBC WITH DIFFERENTIAL/PLATELET
Basophils Absolute: 0 10*3/uL (ref 0.0–0.1)
EOS%: 2.6 % (ref 0.0–7.0)
HCT: 31.8 % — ABNORMAL LOW (ref 34.8–46.6)
HGB: 10.4 g/dL — ABNORMAL LOW (ref 11.6–15.9)
LYMPH%: 28.4 % (ref 14.0–49.7)
MCH: 30.7 pg (ref 25.1–34.0)
MCHC: 32.7 g/dL (ref 31.5–36.0)
MONO#: 0.6 10*3/uL (ref 0.1–0.9)
NEUT%: 58.6 % (ref 38.4–76.8)
Platelets: 195 10*3/uL (ref 145–400)
lymph#: 1.7 10*3/uL (ref 0.9–3.3)

## 2011-01-04 LAB — COMPREHENSIVE METABOLIC PANEL
BUN: 17 mg/dL (ref 6–23)
CO2: 24 mEq/L (ref 19–32)
Calcium: 9.1 mg/dL (ref 8.4–10.5)
Chloride: 102 mEq/L (ref 96–112)
Creatinine, Ser: 0.8 mg/dL (ref 0.40–1.20)
Total Bilirubin: 0.6 mg/dL (ref 0.3–1.2)

## 2011-02-15 ENCOUNTER — Encounter (HOSPITAL_BASED_OUTPATIENT_CLINIC_OR_DEPARTMENT_OTHER): Payer: Medicare Other | Admitting: Oncology

## 2011-02-15 ENCOUNTER — Other Ambulatory Visit (HOSPITAL_COMMUNITY): Payer: Self-pay | Admitting: Oncology

## 2011-02-15 DIAGNOSIS — I10 Essential (primary) hypertension: Secondary | ICD-10-CM

## 2011-02-15 LAB — CBC WITH DIFFERENTIAL/PLATELET
Basophils Absolute: 0 10*3/uL (ref 0.0–0.1)
Eosinophils Absolute: 0.1 10*3/uL (ref 0.0–0.5)
HCT: 32.8 % — ABNORMAL LOW (ref 34.8–46.6)
HGB: 10.8 g/dL — ABNORMAL LOW (ref 11.6–15.9)
LYMPH%: 31.6 % (ref 14.0–49.7)
MCV: 88.4 fL (ref 79.5–101.0)
MONO#: 0.9 10*3/uL (ref 0.1–0.9)
MONO%: 16.1 % — ABNORMAL HIGH (ref 0.0–14.0)
NEUT#: 2.7 10*3/uL (ref 1.5–6.5)
NEUT%: 50.5 % (ref 38.4–76.8)
Platelets: 176 10*3/uL (ref 145–400)

## 2011-04-05 ENCOUNTER — Encounter (HOSPITAL_BASED_OUTPATIENT_CLINIC_OR_DEPARTMENT_OTHER): Payer: Medicare Other | Admitting: Oncology

## 2011-04-05 ENCOUNTER — Other Ambulatory Visit (HOSPITAL_COMMUNITY): Payer: Self-pay | Admitting: Oncology

## 2011-04-05 LAB — COMPREHENSIVE METABOLIC PANEL
ALT: 19 U/L (ref 0–35)
AST: 38 U/L — ABNORMAL HIGH (ref 0–37)
Alkaline Phosphatase: 71 U/L (ref 39–117)
CO2: 24 mEq/L (ref 19–32)
Creatinine, Ser: 0.78 mg/dL (ref 0.50–1.10)
Total Bilirubin: 0.6 mg/dL (ref 0.3–1.2)

## 2011-04-05 LAB — CBC WITH DIFFERENTIAL/PLATELET
BASO%: 0.5 % (ref 0.0–2.0)
Basophils Absolute: 0 10*3/uL (ref 0.0–0.1)
EOS%: 2.5 % (ref 0.0–7.0)
HCT: 35.5 % (ref 34.8–46.6)
HGB: 11.7 g/dL (ref 11.6–15.9)
LYMPH%: 31.6 % (ref 14.0–49.7)
MCH: 30.1 pg (ref 25.1–34.0)
MCHC: 33 g/dL (ref 31.5–36.0)
NEUT%: 50.3 % (ref 38.4–76.8)
Platelets: 161 10*3/uL (ref 145–400)

## 2011-04-05 LAB — FERRITIN: Ferritin: 18 ng/mL (ref 10–291)

## 2011-04-05 LAB — LACTATE DEHYDROGENASE: LDH: 129 U/L (ref 94–250)

## 2011-08-02 ENCOUNTER — Telehealth: Payer: Self-pay | Admitting: Oncology

## 2011-08-02 NOTE — Telephone Encounter (Signed)
appt for 12/3 w/RJ cx'd - r/s for 12/12 @ 1:30 pm. S/w pt she is aware.

## 2011-08-05 ENCOUNTER — Other Ambulatory Visit: Payer: Medicare Other | Admitting: Lab

## 2011-08-05 ENCOUNTER — Ambulatory Visit: Payer: Medicare Other | Admitting: Physician Assistant

## 2011-08-12 ENCOUNTER — Encounter: Payer: Self-pay | Admitting: Medical Oncology

## 2011-08-13 ENCOUNTER — Telehealth: Payer: Self-pay | Admitting: Medical Oncology

## 2011-08-13 NOTE — Telephone Encounter (Signed)
I called pt to see if she can  Move hr appointment to 1300pm tomorrow. She states that she can.

## 2011-08-14 ENCOUNTER — Other Ambulatory Visit (HOSPITAL_COMMUNITY): Payer: Self-pay | Admitting: Oncology

## 2011-08-14 ENCOUNTER — Telehealth: Payer: Self-pay | Admitting: Oncology

## 2011-08-14 ENCOUNTER — Other Ambulatory Visit (HOSPITAL_BASED_OUTPATIENT_CLINIC_OR_DEPARTMENT_OTHER): Payer: Medicare Other | Admitting: Lab

## 2011-08-14 ENCOUNTER — Ambulatory Visit (HOSPITAL_BASED_OUTPATIENT_CLINIC_OR_DEPARTMENT_OTHER): Payer: Medicare Other | Admitting: Oncology

## 2011-08-14 LAB — CBC WITH DIFFERENTIAL/PLATELET
BASO%: 0.5 % (ref 0.0–2.0)
EOS%: 1.4 % (ref 0.0–7.0)
HCT: 39.6 % (ref 34.8–46.6)
MCH: 33.6 pg (ref 25.1–34.0)
MCHC: 33.3 g/dL (ref 31.5–36.0)
MONO#: 0.7 10*3/uL (ref 0.1–0.9)
NEUT%: 50.5 % (ref 38.4–76.8)
RBC: 3.92 10*6/uL (ref 3.70–5.45)
RDW: 18.1 % — ABNORMAL HIGH (ref 11.2–14.5)
WBC: 5.4 10*3/uL (ref 3.9–10.3)
lymph#: 1.9 10*3/uL (ref 0.9–3.3)

## 2011-08-14 LAB — COMPREHENSIVE METABOLIC PANEL
ALT: 18 U/L (ref 0–35)
AST: 41 U/L — ABNORMAL HIGH (ref 0–37)
Albumin: 4.2 g/dL (ref 3.5–5.2)
Alkaline Phosphatase: 86 U/L (ref 39–117)
Calcium: 9.5 mg/dL (ref 8.4–10.5)
Chloride: 100 mEq/L (ref 96–112)
Potassium: 4.3 mEq/L (ref 3.5–5.3)
Sodium: 136 mEq/L (ref 135–145)
Total Protein: 7.1 g/dL (ref 6.0–8.3)

## 2011-08-14 LAB — LACTATE DEHYDROGENASE: LDH: 157 U/L (ref 94–250)

## 2011-08-14 NOTE — Progress Notes (Signed)
CC:   Gabriel Earing, M.D. Georgette Shell, PA  HISTORY:  Darriana Deboy was seen today for followup of her hemochromatosis apparently diagnosed in September 2009.  The patient is homozygous for C282Y gene.  Previously the patient had early cirrhosis and hepatomegaly.  Ms. Bills has undergone a number of phlebotomies since mid September 2010 at which time her ferritin was over 4000.  Her most recent phlebotomy was on 12/07/2010.  Ms. Arriaga was last seen by [place] on 04/05/2011 at which time her ferritin was 18.  She is without complaints today.  She lives alone, is quite independent.  Patient avoids iron in all forms, especially foods.  She is active.  She tells me she has never had a mammogram when I inquired.  I basically encouraged her to have a mammogram but I do not think she is very interested in that.  PROBLEM LIST: 1. Hereditary hemochromatosis, homozygous for the C282Y gene with     diagnosis in September 2009. 2. History of hypertension. 3. Abnormal MRI of the liver showing increased iron in October 2009.  MEDICINES:  Norvasc 2.5 mg daily, calcium carbonate, and Toprol-XL 25 mg daily.  PHYSICAL EXAM:  General:  The patient looks well.  She is now 77 years old.  Vital signs:  Weight is 120 pounds which is stable.  Height 5 feet 5 inches.  Body surface area 1.58 meter squared.  Blood pressure today 179/81 in the left arm sitting.  The patient was informed about her elevated blood pressure.  Other vital signs are normal.  HEENT:  There is no scleral icterus.  Mouth and pharynx are benign.  No peripheral adenopathy palpable.  Heart/lungs:  Normal.  The patient is quite ticklish and thus a good abdominal exam was not possible.  In the past, I felt the liver edge extending down to the umbilicus with a span of 9- 10 cm.  Extremities:  No peripheral edema.  No clubbing.  Neurologic: Exam is grossly normal.  LABORATORY DATA:  Today, white count 5.4, ANC 2.7, hemoglobin 13.2, hematocrit  39.6, platelets 167,000.  Chemistries and ferritin are pending.  We have chemistries from 04/05/2011 notable for glucose of 101 and a AST of 38, just barely elevated.  Other chemistries are normal. LDH is 129, albumin 4.0.  Ferritin on 04/05/2011 was 18.  Ferritin on 12/07/2010 was 26.  Ferritin on 08/08/2010 was 30.  As stated above, ferritin back in the fall 2009 was over 4000.  IMPRESSION AND PLAN:  Ms. Ryer is doing well from the standpoint of her hemochromatosis.  There are no health issues at this time.  We await the results of her ferritin today.  We will carry out a 0.5 unit phlebotomy if her ferritin seems to be rising significantly.  We will plan to see Ms. Durall again in 4 months, at which time we will check CBC, chemistries, and ferritin.  For now we have not scheduled the patient for any phlebotomies on a regular basis.   ______________________________ Samul Dada, M.D. DSM/MEDQ  D:  08/14/2011  T:  08/14/2011  Job:  [id number]

## 2011-08-14 NOTE — Telephone Encounter (Signed)
gve the pt her April 2013 appt calendar 

## 2011-08-14 NOTE — Progress Notes (Signed)
This office note has been dictated.  #[id number]

## 2011-10-15 ENCOUNTER — Telehealth: Payer: Self-pay | Admitting: Oncology

## 2011-10-15 NOTE — Telephone Encounter (Signed)
Moved 4/12 appt to 4/9 @ 2:30 pm due to DM out of office. S/w pt today re change w/new d/t for 4/9 @ 2:30pm.

## 2011-12-10 ENCOUNTER — Other Ambulatory Visit (HOSPITAL_BASED_OUTPATIENT_CLINIC_OR_DEPARTMENT_OTHER): Payer: Medicare Other | Admitting: Lab

## 2011-12-10 ENCOUNTER — Telehealth: Payer: Self-pay | Admitting: Oncology

## 2011-12-10 ENCOUNTER — Encounter: Payer: Self-pay | Admitting: Oncology

## 2011-12-10 ENCOUNTER — Ambulatory Visit (HOSPITAL_BASED_OUTPATIENT_CLINIC_OR_DEPARTMENT_OTHER): Payer: Medicare Other | Admitting: Oncology

## 2011-12-10 DIAGNOSIS — I10 Essential (primary) hypertension: Secondary | ICD-10-CM

## 2011-12-10 LAB — COMPREHENSIVE METABOLIC PANEL
ALT: 17 U/L (ref 0–35)
Alkaline Phosphatase: 93 U/L (ref 39–117)
CO2: 21 mEq/L (ref 19–32)
Creatinine, Ser: 0.74 mg/dL (ref 0.50–1.10)
Sodium: 138 mEq/L (ref 135–145)
Total Bilirubin: 0.7 mg/dL (ref 0.3–1.2)

## 2011-12-10 LAB — CBC WITH DIFFERENTIAL/PLATELET
BASO%: 0.2 % (ref 0.0–2.0)
EOS%: 1.2 % (ref 0.0–7.0)
HCT: 39.6 % (ref 34.8–46.6)
LYMPH%: 33.6 % (ref 14.0–49.7)
MCH: 34.2 pg — ABNORMAL HIGH (ref 25.1–34.0)
MCHC: 34.6 g/dL (ref 31.5–36.0)
MCV: 98.8 fL (ref 79.5–101.0)
MONO#: 0.9 10*3/uL (ref 0.1–0.9)
MONO%: 14.3 % — ABNORMAL HIGH (ref 0.0–14.0)
NEUT%: 50.7 % (ref 38.4–76.8)
Platelets: 176 10*3/uL (ref 145–400)
RBC: 4.01 10*6/uL (ref 3.70–5.45)

## 2011-12-10 LAB — IRON AND TIBC
%SAT: 38 % (ref 20–55)
TIBC: 303 ug/dL (ref 250–470)
UIBC: 188 ug/dL (ref 125–400)

## 2011-12-10 LAB — LACTATE DEHYDROGENASE: LDH: 150 U/L (ref 94–250)

## 2011-12-10 NOTE — Progress Notes (Signed)
This office note has been dictated.  #[id number]

## 2011-12-10 NOTE — Progress Notes (Signed)
CC:   Gabriel Earing, M.D.  PROBLEM LIST: 1. Hereditary hemochromatosis, homozygous for the C282Y gene with     diagnosis established in September 2009 at which time, the ferritin     level was 4484. 2. History of hypertension. 3. Abnormal MRI of the liver showing increased iron deposition within     the liver as well as evidence of cirrhosis in October 2009.  MEDICATIONS: 1. Norvasc 2.5 mg daily. 2. Calcium carbonate 600 mg daily. 3. Toprol-XL 25 mg daily.  HISTORY:  I am seeing Brenda Gardner today for followup of her hemochromatosis diagnosed in September 2009.  The patient is homozygous for the C282Y gene.  As noted above there was evidence for increased iron deposition, cirrhosis and hepatomegaly on an MRI of the abdomen carried out on 06/05/2008.  The patient is here by herself.  She lives alone.  She was last seen by [place] on 08/14/2011 at which time her ferritin level was 31 up from 18 in August of 2012.  The patient's most recent phlebotomy was back on 12/07/2010.  She avoids iron in all forms especially foods.  She is active and independent.  She is without complaints.  There have been no changes in her condition over the past 4 months.  She tells me that she used to go to [facility] and see the physician's assistant however, she tells me that the [facility] on [address] is close.  I have urged her to find a primary care physician.  Apparently she takes her mother to [place].  PHYSICAL EXAM:  General: The patient is a spry woman looking her stated age of about 54.  Weight is 121.3 pounds which is stable.  Height 5 feet 5-1/2 inches, body surface area 1.59 m square.  Vital signs:  Blood pressure 153/84.  Other vital signs are normal.  There is no scleral icterus.  Mouth and pharynx are benign.  No peripheral adenopathy palpable.  Heart and lungs:  Normal.  The patient is quite ticklish. There were no obvious abnormalities of her abdominal exam.  In the past I have felt  the patient's liver edge extending down to the umbilicus with a span of about 9-10 cm.  Extremities:  Puffy ankles, left greater than right.  The patient tells me that she may have sprained her left ankle.  No clubbing.  Neurologic exam:  Nonfocal.  LABORATORY DATA: 1. Today, white count 6.1, ANC 3.1, hemoglobin 13.7, hematocrit 39.6,     platelets 176,000.  MCV was 98.8, MCH 34.2.  Chemistries and iron     studies from today are pending.  Chemistries from 08/14/2011 were     notable for glucose of 101 and an AST of 41 with normal being 0 to     37.  ALT was 18, albumin 4.2.  LDH 157 and alkaline phosphatase 86,     all of which were normal.  Renal function is also normal with a BUN     of 12, creatinine 0.70.  Ferritin on 12/12 came back 31, whereas     back on 04/05/2011 it had been 18.  IMAGING STUDIES: 1. MRI of the abdomen with and without IV contrast on 06/05/2008     showed marked iron deposition within the liver with sparing of the     spleen consistent with genetic hemochromatosis.  There was atrophy     of the left lateral hepatic lobe with mild linear enhancement.     This was favored  to be most consistent with cirrhosis rather than     hepatocellular carcinoma.  There was mild iron deposition within     the pancreas. 2. Complete abdominal ultrasound of the abdomen on 05/09/2009 showed     no gallstones within the gallbladder and a normal common bile duct.     There was heterogeneous echotexture of the liver, felt to be due to     hepatitis or cirrhosis.  There was no focal hepatic mass.  No     hydronephrosis or renal calculi. 3. Chest x-ray, 2 view, from 07/27/2009 showed mild cardiomegaly with     no acute findings.  IMPRESSION AND PLAN:  Clinically, [name] continues to do well.  I have urged her to find a primary care physician.  At the very least, she will need someone to refill her prescriptions for hypertension and to monitor her blood pressure.  The patient  has been urged to have mammograms in the past but does not seem to be very interested in this.  We are awaiting the results of the iron studies today.  If it appears the ferritin is rising significantly then we will go ahead and schedule the patient for 1/2 unit phlebotomy.  I believe in the past she may have had a lightheaded episode after having a 1 unit phlebotomy.  We are ordering her phlebotomies on an as needed basis.  It will be recalled that her ferritin back on 06/01/2008 was 4484.  We will plan to see Ms. Finkel again in 4 months at which time we will check CBC, chemistries, and iron studies.    ______________________________ Samul Dada, M.D. DSM/MEDQ  D:  12/10/2011  T:  12/10/2011  Job:  [id number]

## 2011-12-10 NOTE — Telephone Encounter (Signed)
gve the pt her aug 2013 appt calendar 

## 2011-12-13 ENCOUNTER — Other Ambulatory Visit: Payer: Medicare Other

## 2011-12-13 ENCOUNTER — Ambulatory Visit: Payer: Medicare Other | Admitting: Oncology

## 2012-04-08 ENCOUNTER — Telehealth: Payer: Self-pay | Admitting: Oncology

## 2012-04-08 NOTE — Telephone Encounter (Signed)
s.w pt and she is aware of her 8/16 appt  aom

## 2012-04-10 ENCOUNTER — Ambulatory Visit: Payer: Medicare Other | Admitting: Family

## 2012-04-10 ENCOUNTER — Other Ambulatory Visit: Payer: Medicare Other | Admitting: Lab

## 2012-04-13 ENCOUNTER — Telehealth: Payer: Self-pay | Admitting: Oncology

## 2012-04-13 NOTE — Telephone Encounter (Signed)
called pt with new appt time for 8/14

## 2012-04-15 ENCOUNTER — Telehealth: Payer: Self-pay | Admitting: Oncology

## 2012-04-15 ENCOUNTER — Encounter: Payer: Self-pay | Admitting: Family

## 2012-04-15 ENCOUNTER — Other Ambulatory Visit (HOSPITAL_BASED_OUTPATIENT_CLINIC_OR_DEPARTMENT_OTHER): Payer: Medicare Other | Admitting: Lab

## 2012-04-15 ENCOUNTER — Ambulatory Visit (HOSPITAL_BASED_OUTPATIENT_CLINIC_OR_DEPARTMENT_OTHER): Payer: Medicare Other | Admitting: Family

## 2012-04-15 LAB — CBC WITH DIFFERENTIAL/PLATELET
BASO%: 0.3 % (ref 0.0–2.0)
Eosinophils Absolute: 0.1 10*3/uL (ref 0.0–0.5)
LYMPH%: 34.4 % (ref 14.0–49.7)
MCHC: 33.2 g/dL (ref 31.5–36.0)
MCV: 102.5 fL — ABNORMAL HIGH (ref 79.5–101.0)
MONO#: 0.7 10*3/uL (ref 0.1–0.9)
MONO%: 13.8 % (ref 0.0–14.0)
NEUT#: 2.6 10*3/uL (ref 1.5–6.5)
Platelets: 136 10*3/uL — ABNORMAL LOW (ref 145–400)
RBC: 4.37 10*6/uL (ref 3.70–5.45)
RDW: 12.9 % (ref 11.2–14.5)
WBC: 5.2 10*3/uL (ref 3.9–10.3)

## 2012-04-15 LAB — COMPREHENSIVE METABOLIC PANEL
ALT: 23 U/L (ref 0–35)
Albumin: 3.8 g/dL (ref 3.5–5.2)
Alkaline Phosphatase: 89 U/L (ref 39–117)
Glucose, Bld: 105 mg/dL — ABNORMAL HIGH (ref 70–99)
Potassium: 4.4 mEq/L (ref 3.5–5.3)
Sodium: 137 mEq/L (ref 135–145)
Total Bilirubin: 0.7 mg/dL (ref 0.3–1.2)
Total Protein: 7 g/dL (ref 6.0–8.3)

## 2012-04-15 LAB — IRON AND TIBC

## 2012-04-15 NOTE — Telephone Encounter (Signed)
gve the pt her aug and dec 2013 appt calendar

## 2012-04-15 NOTE — Patient Instructions (Addendum)
Patient ID: Brenda Gardner,   DOB: 10-Jan-1933,  MRN: [id number]   [facility] Discharge Instructions  RECOMMENDATIONS MAD BY THE CONSULTANT AND ANY TEST RESULT(S) WILL BE FORWARDED TO YOU REFERRING DOCTOR   EXAM FINDINGS BY NURSE PRACTITIONER TODAY TO REPORT TO THE CLINIC OR PRIMARY PROVIDER: Pneumonia vaccine - check with you PCP   Your Current Medications Are: Current Outpatient Prescriptions  Medication Sig Dispense Refill  . co-enzyme Q-10 30 MG capsule Take 30 mg by mouth daily.      Marland Kitchen amLODipine (NORVASC) 2.5 MG tablet Take 2.5 mg by mouth daily.        . calcium carbonate (OS-CAL) 600 MG TABS Take 600 mg by mouth daily.        . metoprolol succinate (TOPROL-XL) 25 MG 24 hr tablet Take 25 mg by mouth daily.           INSTRUCTIONS GIVEN, DISCUSSED AND FOLLOW-UP: Please do not loose any more weight.  Try to consume nutritional supplements (Ensure) with meals instead of in replacement of meals.  I acknowledge that I have been informed and understand all the instructions given to me and have received a copy.  I do not have any further questions at this time, but I understand that I may call the [facility] at [phone] during business hours should I have any further questions or need assistance in obtaining follow-up care.   04/15/2012, 4:54 PM

## 2012-04-15 NOTE — Telephone Encounter (Signed)
gve the pt the mri abdomen appt at [facility]

## 2012-04-15 NOTE — Progress Notes (Signed)
Patient ID: Brenda Gardner, female   DOB: 11-29-1932, 78 y.o.   MRN: [id number] CSN: [id number]  CC: Gabriel Earing, MD Katharina Caper, NP-C  Problem List: Brenda Gardner is a 78 y.o. Caucasian female with a problem list consisting of:  1. Hereditary hemochromatosis, homozygous for the C282Y gene with diagnosis established in September 2009 at which time, the ferritin level was 4484.  2. History of hypertension.  3. Abnormal MRI of the liver showing increased iron deposition within the liver as well as evidence of cirrhosis in October 2009.  We are seeing Roxine Whittinghill today for follow up of her hemochromatosis diagnosed in September 2009. The patient is homozygous  for the C282Y gene. As noted above there was evidence for increased iron deposition, cirrhosis and hepatomegaly on an MRI of the abdomen carried out on 06/05/2008. The patient is here by herself. She lives alone. She was last seen by [place] on 12/10/2011 at which time her ferritin level was 38 up from 18 in August of 2012. The patient's most recent phlebotomy was back on 12/07/2010. She avoids iron in all forms especially foods. She is active and independent. Today she has complaints related to loss of appetite, with a subsequent 7 lb weight loss since her last visit and decreased energy level.  The patient states that her thyroid levels have recently been checked and are normal.  We discussed nutritional supplements (Ensure) and she states that when she drinks an Ensure it is like having a complete meal, so then she skips the next meal.  The patient states that she is now going to Dr. Katharina Caper at the [facility] location in the [place] area (near [place], [place]).  The patient states that she smokes approximately 1/3 pack of cigarettes daily and she is not ready to quit smoking.  The patient was offered smoking cessation resources.  The patient states that she has never had a mammogram or colonoscopy and she has no intentions on ever  receiving them.  Ms. Messamore states that her mother is over 15 years old and she has never had a mammogram or a colonoscopy.  The patient does receive the influenza vaccination annually with the last vaccination given in 2012 by her PCP.  The patient has not had the Pneumovax in over 6 years but agreed to ask her PCP for the vaccination this year. The patient denies any other symptomatology or concerns.   Past Medical History: Past Medical History  Diagnosis Date  . Hypertension   . Hemochromatosis     Surgical History: Past Surgical History  Procedure Date  . Tubal ligation   . Appendectomy   . Tonsillectomy     Current Medications: Current Outpatient Prescriptions  Medication Sig Dispense Refill  . co-enzyme Q-10 30 MG capsule Take 30 mg by mouth daily.      Marland Kitchen amLODipine (NORVASC) 2.5 MG tablet Take 2.5 mg by mouth daily.        . calcium carbonate (OS-CAL) 600 MG TABS Take 600 mg by mouth daily.        . metoprolol succinate (TOPROL-XL) 25 MG 24 hr tablet Take 25 mg by mouth daily.          Allergies: Allergies  Allergen Reactions  . Lisinopril Swelling     Family History: History reviewed. No pertinent family history.  Social History: History  Substance Use Topics  . Smoking status: Current Everyday Smoker -- 0.3 packs/day for 50 years    Types: Cigarettes  .  Smokeless tobacco: Not on file  . Alcohol Use: Yes    Review of Systems: 10 Point review of systems was completed and is negative except as noted above.   Physical Exam:   Blood pressure 171/85, pulse 69, temperature 98.4 F (36.9 C), temperature source Oral, resp. rate 18, height 5' 5.5" (1.664 m), weight 114 lb 6.4 oz (51.891 kg).  General appearance: Alert, cooperative, under-nourished, no apparent distress Head: Normocephalic, without obvious abnormality, atraumatic Eyes: Conjunctivae/corneas clear, PERRLA, EOMI Nose: Nares, septum and mucosa are normal, no drainage or sinus tenderness Throat:  Lips, mucosa, gums and tongue are normal, upper dentition is dentures Neck: No adenopathy, supple, symmetrical, trachea midline and thyroid not enlarged, symmetric, no tenderness/mass/nodules Resp: Diminished breath sounds bilaterally, no wheezes/rales/rhonchi Cardio: Regular rate and rhythm, S1, S2 normal, no murmur, click, rub or gallop GI: Soft, non-tender, non-distended, bowel sounds normal, no organomegaly Extremities: Extremities normal, atraumatic, no cyanosis or edema Lymph nodes: Cervical, supraclavicular, and axillary nodes normal Neurologic: Grossly normal   Laboratory Data: Results for orders placed in visit on 04/15/12 (from the past 48 hour(s))  CBC WITH DIFFERENTIAL     Status: Abnormal   Collection Time   04/15/12  3:31 PM      Component Value Range Comment   WBC 5.2  3.9 - 10.3 10e3/uL    NEUT# 2.6  1.5 - 6.5 10e3/uL    HGB 14.9  11.6 - 15.9 g/dL    HCT 16.1  09.6 - 04.5 %    Platelets 136 (*) 145 - 400 10e3/uL    MCV 102.5 (*) 79.5 - 101.0 fL    MCH 34.0  25.1 - 34.0 pg    MCHC 33.2  31.5 - 36.0 g/dL    RBC 4.09  8.11 - 9.14 10e6/uL    RDW 12.9  11.2 - 14.5 %    lymph# 1.8  0.9 - 3.3 10e3/uL    MONO# 0.7  0.1 - 0.9 10e3/uL    Eosinophils Absolute 0.1  0.0 - 0.5 10e3/uL    Basophils Absolute 0.0  0.0 - 0.1 10e3/uL    NEUT% 50.0  38.4 - 76.8 %    LYMPH% 34.4  14.0 - 49.7 %    MONO% 13.8  0.0 - 14.0 %    EOS% 1.5  0.0 - 7.0 %    BASO% 0.3  0.0 - 2.0 %   COMPREHENSIVE METABOLIC PANEL     Status: Abnormal   Collection Time   04/15/12  3:31 PM      Component Value Range Comment   Sodium 137  135 - 145 mEq/L    Potassium 4.4  3.5 - 5.3 mEq/L    Chloride 98  96 - 112 mEq/L    CO2 29  19 - 32 mEq/L    Glucose, Bld 105 (*) 70 - 99 mg/dL    BUN 9  6 - 23 mg/dL    Creatinine, Ser 7.82  0.50 - 1.10 mg/dL    Total Bilirubin 0.7  0.3 - 1.2 mg/dL    Alkaline Phosphatase 89  39 - 117 U/L    AST 61 (*) 0 - 37 U/L    ALT 23  0 - 35 U/L    Total Protein 7.0  6.0 - 8.3  g/dL    Albumin 3.8  3.5 - 5.2 g/dL    Calcium 9.4  8.4 - 95.6 mg/dL   LACTATE DEHYDROGENASE     Status: Normal   Collection Time  04/15/12  3:31 PM      Component Value Range Comment   LDH 177  94 - 250 U/L      Imaging Studies: 1. MRI of the abdomen with and without IV contrast on 06/05/2008 showed marked iron deposition within the liver with sparing of the  spleen consistent with genetic hemochromatosis. There was atrophy of the left lateral hepatic lobe with mild linear enhancement.  This was favored to be most consistent with cirrhosis rather than hepatocellular carcinoma. There was mild iron deposition within  the pancreas.  2. Complete abdominal ultrasound of the abdomen on 05/09/2009 showed no gallstones within the gallbladder and a normal common bile duct. There was heterogeneous echo texture of the liver, felt to be due to hepatitis or cirrhosis. There was no focal hepatic mass. No hydronephrosis or renal calculi.  3. Chest x-ray, 2 view, from 07/27/2009 showed mild cardiomegaly with no acute findings.  Impression/Plan: Clinically, Ms. Brissett continues to do well despite her recent weight loss.  A nutritional plan has been discussed and further weight loss discouraged.   I encouraged the patient to monitor her blood for 3 more readings within the next week and to see her PCP immediately if they remain elevated (today's reading 171/85) - the patient agreed to do so and states that she will use the BP machine at [facility]. The patient has been urged to have mammograms and colonoscopies, but does not seem to be very interested in this.  Smoking cessation was also discussed with the patient, without much interest.  We are awaiting the results of the iron studies today. If it appears the ferritin is rising significantly then we will go ahead and schedule the patient for 1/2 unit phlebotomy. In the past, the patient has had a lightheaded episode after having a 1 unit phlebotomy. We are  ordering her phlebotomies on an as needed basis. It will be recalled that her ferritin back on 06/01/2008 was 4484. The patient has agreed to receive an abdominal MRI with and without contrast next week.  We will plan to see Ms. Ribble again in 4 months at which time we will check CBC, chemistries, and iron studies.    Toma Arts  NP-C  04/15/2012, 5:03 PM

## 2012-04-16 ENCOUNTER — Other Ambulatory Visit: Payer: Self-pay | Admitting: Oncology

## 2012-04-16 ENCOUNTER — Encounter: Payer: Self-pay | Admitting: Oncology

## 2012-04-16 NOTE — Progress Notes (Signed)
This patient had a ferritin of 180 on 814, up from 38 on 12/10/2011 and 31 on 08/14/2011. Hemoglobin was 14.9 and hematocrit 44.7 on 04/15/2012.  We are arranging for a half unit phlebotomy on or about August 21. I reported a CBC and ferritin for 05/06/2012. We will continue with half unit phlebotomy is noted to keep the ferritin below 50.  The patient's next appointment to see me will be in 4 months, around mid December.

## 2012-04-17 ENCOUNTER — Telehealth: Payer: Self-pay | Admitting: *Deleted

## 2012-04-17 ENCOUNTER — Ambulatory Visit: Payer: Medicare Other | Admitting: Family

## 2012-04-17 ENCOUNTER — Telehealth: Payer: Self-pay | Admitting: Medical Oncology

## 2012-04-17 ENCOUNTER — Other Ambulatory Visit: Payer: Medicare Other | Admitting: Lab

## 2012-04-17 NOTE — Telephone Encounter (Signed)
Per staff message and POF I have scheduled appts.  JMW  

## 2012-04-17 NOTE — Telephone Encounter (Signed)
I called pt per Dr. Arline Asp to inform her that her ferritin has increased from 38 on 12/10/2011 to 180 on 04/15/12. He has ordered her to have 1/2 of phlebotomy on 04/22/12 and labs on 05/06/12. I gave her the dates and times. She voiced understanding.

## 2012-04-20 ENCOUNTER — Ambulatory Visit (HOSPITAL_COMMUNITY)
Admission: RE | Admit: 2012-04-20 | Discharge: 2012-04-20 | Disposition: A | Discharge status: Home / Self Care | Payer: Medicare Other | Source: Ambulatory Visit | Attending: Family | Admitting: Family

## 2012-04-22 ENCOUNTER — Other Ambulatory Visit: Payer: Self-pay | Admitting: Medical Oncology

## 2012-04-22 ENCOUNTER — Encounter: Payer: Self-pay | Admitting: Medical Oncology

## 2012-04-22 ENCOUNTER — Ambulatory Visit (HOSPITAL_BASED_OUTPATIENT_CLINIC_OR_DEPARTMENT_OTHER): Payer: Medicare Other

## 2012-04-22 DIAGNOSIS — K121 Other forms of stomatitis: Secondary | ICD-10-CM

## 2012-04-22 DIAGNOSIS — K1230 Oral mucositis (ulcerative), unspecified: Secondary | ICD-10-CM

## 2012-04-22 MED ORDER — LORAZEPAM 1 MG PO TABS: ORAL_TABLET | ORAL | Status: DC

## 2012-04-22 MED ORDER — SODIUM CHLORIDE 0.9 % IV SOLN
Freq: Once | INTRAVENOUS | Status: AC
  Administered 2012-04-22: 11:00:00 via INTRAVENOUS

## 2012-04-22 NOTE — Patient Instructions (Signed)

## 2012-04-22 NOTE — Progress Notes (Unsigned)
Pt was here for phlebotomy this am and she said she tried to get her scan done this week but they could not complete due to her anxiety. She is wondering if we can call in something for her anxiety. I told her Dr. Arline Asp will prescribe her something and we will call in.

## 2012-04-22 NOTE — Progress Notes (Signed)
1125- Post phlebotomy, pt c/o feeling "different than when she came in."  Verbal order rec'd from Dr. Arline Asp to administer 250 ml NS bolus. 1155- Pt states she feels a lot better.

## 2012-04-28 ENCOUNTER — Ambulatory Visit (HOSPITAL_COMMUNITY)
Admission: RE | Admit: 2012-04-28 | Discharge: 2012-04-28 | Disposition: A | Discharge status: Home / Self Care | Payer: Medicare Other | Source: Ambulatory Visit | Attending: Family | Admitting: Family

## 2012-04-28 DIAGNOSIS — R160 Hepatomegaly, not elsewhere classified: Secondary | ICD-10-CM | POA: Insufficient documentation

## 2012-04-28 MED ORDER — GADOBENATE DIMEGLUMINE 529 MG/ML IV SOLN
10.0000 mL | Freq: Once | INTRAVENOUS | Status: AC | PRN
  Administered 2012-04-28: 10 mL via INTRAVENOUS

## 2012-05-06 ENCOUNTER — Other Ambulatory Visit (HOSPITAL_BASED_OUTPATIENT_CLINIC_OR_DEPARTMENT_OTHER): Payer: Medicare Other | Admitting: Lab

## 2012-05-06 LAB — CBC WITH DIFFERENTIAL/PLATELET
BASO%: 0.4 % (ref 0.0–2.0)
Basophils Absolute: 0 10*3/uL (ref 0.0–0.1)
EOS%: 1.9 % (ref 0.0–7.0)
Eosinophils Absolute: 0.1 10*3/uL (ref 0.0–0.5)
HCT: 38.8 % (ref 34.8–46.6)
HGB: 13.6 g/dL (ref 11.6–15.9)
LYMPH%: 35.8 % (ref 14.0–49.7)
MCH: 33.5 pg (ref 25.1–34.0)
MCHC: 35.1 g/dL (ref 31.5–36.0)
MCV: 95.6 fL (ref 79.5–101.0)
MONO#: 0.8 10*3/uL (ref 0.1–0.9)
MONO%: 15.6 % — ABNORMAL HIGH (ref 0.0–14.0)
NEUT#: 2.5 10*3/uL (ref 1.5–6.5)
NEUT%: 46.3 % (ref 38.4–76.8)
Platelets: 160 10*3/uL (ref 145–400)
RBC: 4.06 10*6/uL (ref 3.70–5.45)
RDW: 13.3 % (ref 11.2–14.5)
WBC: 5.3 10*3/uL (ref 3.9–10.3)
lymph#: 1.9 10*3/uL (ref 0.9–3.3)

## 2012-05-06 LAB — FERRITIN: Ferritin: 228 ng/mL (ref 10–291)

## 2012-08-14 ENCOUNTER — Other Ambulatory Visit (HOSPITAL_BASED_OUTPATIENT_CLINIC_OR_DEPARTMENT_OTHER): Payer: Medicare Other | Admitting: Lab

## 2012-08-14 ENCOUNTER — Ambulatory Visit: Payer: Medicare Other | Admitting: Oncology

## 2012-08-14 ENCOUNTER — Telehealth: Payer: Self-pay

## 2012-08-14 ENCOUNTER — Other Ambulatory Visit: Payer: Self-pay

## 2012-08-14 NOTE — Telephone Encounter (Signed)
Pt stated that she cannot make her 3pm appt because suddenly after lunch she developed a severe case of diarrhea and is nauseated as well. No fever. This is a 4 month follow up so I told her we would reschedule.

## 2012-08-18 ENCOUNTER — Telehealth: Payer: Self-pay | Admitting: Oncology

## 2012-08-18 NOTE — Telephone Encounter (Signed)
Called both pt and her dtr Carney Bern re next appt for 1/2//14 but was not able to reach them or lm. January schedule mailed today.

## 2012-09-29 ENCOUNTER — Ambulatory Visit (HOSPITAL_BASED_OUTPATIENT_CLINIC_OR_DEPARTMENT_OTHER): Payer: Medicare Other | Admitting: Oncology

## 2012-09-29 ENCOUNTER — Other Ambulatory Visit: Payer: Self-pay | Admitting: Medical Oncology

## 2012-09-29 ENCOUNTER — Other Ambulatory Visit (HOSPITAL_BASED_OUTPATIENT_CLINIC_OR_DEPARTMENT_OTHER): Payer: Medicare Other | Admitting: Lab

## 2012-09-29 ENCOUNTER — Telehealth: Payer: Self-pay | Admitting: Oncology

## 2012-09-29 ENCOUNTER — Ambulatory Visit (HOSPITAL_BASED_OUTPATIENT_CLINIC_OR_DEPARTMENT_OTHER): Payer: Medicare Other

## 2012-09-29 ENCOUNTER — Telehealth: Payer: Self-pay | Admitting: *Deleted

## 2012-09-29 ENCOUNTER — Encounter: Payer: Self-pay | Admitting: Oncology

## 2012-09-29 VITALS — BP 160/81 | HR 82 | Temp 98.0°F | Resp 20 | Ht 65.5 in | Wt 105.9 lb

## 2012-09-29 DIAGNOSIS — R05 Cough: Secondary | ICD-10-CM

## 2012-09-29 DIAGNOSIS — R634 Abnormal weight loss: Secondary | ICD-10-CM | POA: Insufficient documentation

## 2012-09-29 LAB — CBC WITH DIFFERENTIAL/PLATELET
BASO%: 0.6 % (ref 0.0–2.0)
Eosinophils Absolute: 0 10*3/uL (ref 0.0–0.5)
LYMPH%: 30.5 % (ref 14.0–49.7)
MCHC: 33.8 g/dL (ref 31.5–36.0)
MONO#: 1 10*3/uL — ABNORMAL HIGH (ref 0.1–0.9)
NEUT#: 3.3 10*3/uL (ref 1.5–6.5)
Platelets: 153 10*3/uL (ref 145–400)
RBC: 4.46 10*6/uL (ref 3.70–5.45)
WBC: 6.3 10*3/uL (ref 3.9–10.3)
lymph#: 1.9 10*3/uL (ref 0.9–3.3)

## 2012-09-29 LAB — COMPREHENSIVE METABOLIC PANEL (CC13)
ALT: 23 U/L (ref 0–55)
AST: 50 U/L — ABNORMAL HIGH (ref 5–34)
Alkaline Phosphatase: 123 U/L (ref 40–150)
BUN: 12.3 mg/dL (ref 7.0–26.0)
Calcium: 9.8 mg/dL (ref 8.4–10.4)
Creatinine: 0.8 mg/dL (ref 0.6–1.1)
Total Bilirubin: 0.87 mg/dL (ref 0.20–1.20)

## 2012-09-29 LAB — IRON AND TIBC
Iron: 269 ug/dL — ABNORMAL HIGH (ref 42–145)
UIBC: <15 ug/dL — ABNORMAL LOW (ref 125–400)

## 2012-09-29 LAB — FERRITIN: Ferritin: 215 ng/mL (ref 10–291)

## 2012-09-29 NOTE — Telephone Encounter (Signed)
Per staff message and POF I have scheduled appts.  JMW  

## 2012-09-29 NOTE — Telephone Encounter (Signed)
Talked to patient gave her appt for phelbotomy for  February 2014, trasnferred call to [facility], pt wants later time

## 2012-09-29 NOTE — Progress Notes (Signed)
18 ga IV placed in left arm for phlebotomy, 250 cc blood removed. 250 cc IVF's given. Patient tolerated procedure well.

## 2012-09-29 NOTE — Progress Notes (Signed)
CC:   Gabriel Earing, M.D.  PROBLEM LIST:  1. Hereditary hemochromatosis, homozygous for the C282Y gene with  diagnosis established in September 2009 at which time, the ferritin  level was 4484.  2. History of hypertension.  3. Abnormal MRI of the liver showing increased iron deposition within  the liver as well as evidence of cirrhosis in October 2009. 4. Weight loss over 2013.  The patient's baseline weight has been     running 120 pounds.  On 09/29/2012 weight was 106 pounds. 5. Cough started in December of 2013.  MEDICATIONS:  Reviewed and recorded. Current Outpatient Prescriptions  Medication Sig Dispense Refill  . amLODipine (NORVASC) 2.5 MG tablet Take 2.5 mg by mouth daily.        . calcium carbonate (OS-CAL) 600 MG TABS Take 600 mg by mouth daily.        Marland Kitchen co-enzyme Q-10 30 MG capsule Take 30 mg by mouth daily.      . metoprolol succinate (TOPROL-XL) 25 MG 24 hr tablet Take 25 mg by mouth daily.          SMOKING HISTORY:  The patient has smoked for over 50 years approximately 1 pack a day.  In the last few weeks she has cut down to less than half a pack a day.   HISTORY:  Khia Dieterich was seen today for followup of her hemochromatosis diagnosed in September 2009 when the patient presented with a ferritin level of 4484.  Ms. Alvidrez was last seen by [place] on 04/15/2012 and prior to that on 12/10/2011.  Ferritin levels had been running in the 20-30 range for the past several years.  The patient had undergone a 1 unit phlebotomy back in April 2012.  When we saw Ms. Kulinski on August 14 her ferritin had jumped to 180 up from 38 on 12/10/2011.  We arranged for a half unit phlebotomy that was carried out on 08/21.  The patient has had some difficulties tolerating a 1 unit phlebotomy.  We routinely give her normal saline.  She apparently tolerated this half unit phlebotomy fairly well.  I believe she was supposed to have other phlebotomies but that apparently did not happen.  On  05/06/2012 the patient's ferritin was 228.  The patient was supposed to see [place] in December but was sick with an upper respiratory illness.  She has had a persistent cough which is essentially nonproductive.  She denies any hemoptysis, chest pain, shortness of breath.  She has cut down on her cigarettes.  In addition, the patient is having anorexia and some early satiety.  She has lost weight in recent months; specifically 9 pounds in the last 5 months and about 15 pounds since April of 2013.  The patient says that she takes in about a can of Ensure every couple of days.  She lives alone and is independent.  PHYSICAL EXAM:  The patient looks rather thin.  She recently turned 79. Weight is 105.8 pounds.  Height 5 feet 5-1/2 inches.  Body surface area 1.49 sq m.  Blood pressure 160/81.  Other vital signs are normal.  There is no scleral icterus.  Mouth and pharynx are benign.  No peripheral adenopathy palpable.  Heart and lungs are normal.  The patient is quite ticklish and it was therefore not possible to get an adequate abdominal exam.  In the past I thought that I could feel the liver edge extending down to the umbilicus with a span of about 9-10 cm.  Extremities, no peripheral edema.  The patient has a bandage on her left hand where she apparently suffered an abrasion after falling.  Neurologic exam was nonfocal.  LABORATORY DATA:  Today white count 6.3, ANC 3.3, hemoglobin 15.6, hematocrit 46.2, platelets 153,000.  MCV was 103.5, MCH 35.0.  These are slightly elevated.  Chemistries from today were normal except for a glucose of 128 and an AST of 50.  Albumin was 3.8, LDH 164.  Iron studies are pending.  On 05/06/2012, ferritin was 228.  On 04/15/2012, ferritin was 180.  Iron was 257.  Saturation was not calculated.   IMAGING STUDIES:  1. MRI of the abdomen with and without IV contrast on 06/05/2008  showed marked iron deposition within the liver with sparing of the  spleen  consistent with genetic hemochromatosis. There was atrophy  of the left lateral hepatic lobe with mild linear enhancement.  This was favored to be most consistent with cirrhosis rather than  hepatocellular carcinoma. There was mild iron deposition within  the pancreas.  2. Complete abdominal ultrasound of the abdomen on 05/09/2009 showed  no gallstones within the gallbladder and a normal common bile duct.  There was heterogeneous echotexture of the liver, felt to be due to  hepatitis or cirrhosis. There was no focal hepatic mass. No  hydronephrosis or renal calculi.  3. Chest x-ray, 2 view, from 07/27/2009 showed mild cardiomegaly with  no acute findings. 4. MRI of the abdomen with and without IV contrast on 04/28/2012 showed a decrease in the apparent degree of iron deposition within the nodular appearing liver compatible with improvement and secondary evidence of hemochromatosis.  There were no enhancing lesions to suggest hepatocellular carcinoma.  There was mild pancreatic iron deposition. There was evidence of left hepatic lobe atrophy with probable internal fibrosis/scarring.  The study was compared with the prior MRI from 06/05/2008 and the previous ultrasound from 05/09/2009.  Consideration of annual MRI surveillance was stated.   IMPRESSION AND PLAN:  I am concerned about Ms. Skalsky's weight loss in particular, also her cough.  She is a long-term smoker.  We will get a chest x-ray today.  The patient is having early satiety, anorexia and weight loss.  If these symptoms continue then the patient may need a GI workup to rule out a GI malignancy such as gastric or pancreatic cancer. On the other hand, she just had an MRI of the abdomen not too long ago, specifically on 04/28/2012, which would weigh against the possibility of pancreatic cancer.  The patient's ferritin has recently been elevated.  I am not sure whether this adequately reflects an increase in iron deposition  or perhaps is reflective of ferritin as an acute phase reactant.  In any event, we will go ahead with a half unit phlebotomy today.  The patient will receive normal saline 200 mL.  As stated, we are checking iron studies today.  I will have the patient come back weekly for CBC and ferritin.  We will make adjustments accordingly.  We will also tentatively schedule Ms. Rallo for half unit phlebotomies with saline on a weekly basis under the assumption that her iron stores have been increasing.  We will plan to see Ms. Pollman again in 1 month at which time we will check CBC, chemistries and iron studies.  As stated, we will have her obtain a chest x-ray today.  In looking through her record, her last chest x-ray apparently was back in November 2010 and showed mild cardiomegaly.  ______________________________ Samul Dada, M.D. DSM/MEDQ  D:  09/29/2012  T:  09/29/2012  Job:  [id number]

## 2012-09-29 NOTE — Patient Instructions (Addendum)

## 2012-09-29 NOTE — Patient Instructions (Signed)
Chest x-ray today.  Ensure or ensure plus 1-2 cans daily.  Try to regain some weight.   Labs and 1/2 unit phlebotomy every week.  See you in 1 month.

## 2012-09-29 NOTE — Progress Notes (Signed)
This office note has been dictated.  #[id number]

## 2012-09-30 ENCOUNTER — Telehealth: Payer: Self-pay | Admitting: *Deleted

## 2012-09-30 NOTE — Telephone Encounter (Signed)
Per patient voicemail she needed to reschedule appts. I have called her back and made new appts.  JMW

## 2012-10-06 ENCOUNTER — Ambulatory Visit (HOSPITAL_BASED_OUTPATIENT_CLINIC_OR_DEPARTMENT_OTHER): Payer: Medicare Other

## 2012-10-06 ENCOUNTER — Other Ambulatory Visit (HOSPITAL_BASED_OUTPATIENT_CLINIC_OR_DEPARTMENT_OTHER): Payer: Medicare Other | Admitting: Lab

## 2012-10-06 ENCOUNTER — Ambulatory Visit: Payer: Medicare Other | Admitting: Lab

## 2012-10-06 LAB — CBC WITH DIFFERENTIAL/PLATELET
BASO%: 0.2 % (ref 0.0–2.0)
LYMPH%: 30.2 % (ref 14.0–49.7)
MCH: 33.9 pg (ref 25.1–34.0)
MCHC: 33.6 g/dL (ref 31.5–36.0)
MCV: 101 fL (ref 79.5–101.0)
MONO%: 12.4 % (ref 0.0–14.0)
Platelets: 161 10*3/uL (ref 145–400)
RBC: 4.01 10*6/uL (ref 3.70–5.45)
WBC: 5.5 10*3/uL (ref 3.9–10.3)

## 2012-10-06 LAB — FERRITIN: Ferritin: 156 ng/mL (ref 10–291)

## 2012-10-06 NOTE — Progress Notes (Signed)
250 grams removed. Patient tolerated well. Nourishment provided. 250 ml Normal saline administered.

## 2012-10-12 ENCOUNTER — Telehealth: Payer: Self-pay | Admitting: Medical Oncology

## 2012-10-12 NOTE — Telephone Encounter (Signed)
Pt called and left a message that she is ill and will not be able to come for her appointment tomorrow.

## 2012-10-20 ENCOUNTER — Other Ambulatory Visit: Payer: Self-pay | Admitting: Medical Oncology

## 2012-10-20 ENCOUNTER — Ambulatory Visit (HOSPITAL_BASED_OUTPATIENT_CLINIC_OR_DEPARTMENT_OTHER): Payer: Medicare Other | Admitting: Lab

## 2012-10-20 ENCOUNTER — Ambulatory Visit: Payer: Medicare Other

## 2012-10-20 LAB — CBC WITH DIFFERENTIAL/PLATELET
BASO%: 0.5 % (ref 0.0–2.0)
EOS%: 0.9 % (ref 0.0–7.0)
MCH: 35.5 pg — ABNORMAL HIGH (ref 25.1–34.0)
MCHC: 33.7 g/dL (ref 31.5–36.0)
MCV: 105.4 fL — ABNORMAL HIGH (ref 79.5–101.0)
MONO%: 14.3 % — ABNORMAL HIGH (ref 0.0–14.0)
NEUT%: 50.8 % (ref 38.4–76.8)
RDW: 15.2 % — ABNORMAL HIGH (ref 11.2–14.5)
lymph#: 1.9 10*3/uL (ref 0.9–3.3)

## 2012-10-20 NOTE — Progress Notes (Signed)
1500- Patient presents to infusion room and states that she is leaving and doesn't want to wait any longer. States she will return next week at her previously scheduled appointment.

## 2012-10-27 ENCOUNTER — Ambulatory Visit (HOSPITAL_BASED_OUTPATIENT_CLINIC_OR_DEPARTMENT_OTHER): Payer: Medicare Other

## 2012-10-27 ENCOUNTER — Other Ambulatory Visit (HOSPITAL_BASED_OUTPATIENT_CLINIC_OR_DEPARTMENT_OTHER): Payer: Medicare Other | Admitting: Lab

## 2012-10-27 ENCOUNTER — Other Ambulatory Visit: Payer: Self-pay | Admitting: Medical Oncology

## 2012-10-27 LAB — CBC WITH DIFFERENTIAL/PLATELET
BASO%: 0.6 % (ref 0.0–2.0)
Eosinophils Absolute: 0.1 10*3/uL (ref 0.0–0.5)
LYMPH%: 30.7 % (ref 14.0–49.7)
MCHC: 33.7 g/dL (ref 31.5–36.0)
MCV: 105.4 fL — ABNORMAL HIGH (ref 79.5–101.0)
MONO%: 14.2 % — ABNORMAL HIGH (ref 0.0–14.0)
NEUT#: 2.6 10*3/uL (ref 1.5–6.5)
RBC: 3.77 10*6/uL (ref 3.70–5.45)
RDW: 14.8 % — ABNORMAL HIGH (ref 11.2–14.5)
WBC: 4.9 10*3/uL (ref 3.9–10.3)

## 2012-10-27 LAB — FERRITIN: Ferritin: 84 ng/mL (ref 10–291)

## 2012-10-27 LAB — IRON AND TIBC
%SAT: 60 % — ABNORMAL HIGH (ref 20–55)
Iron: 174 ug/dL — ABNORMAL HIGH (ref 42–145)
UIBC: 118 ug/dL — ABNORMAL LOW (ref 125–400)

## 2012-10-27 LAB — LACTATE DEHYDROGENASE (CC13): LDH: 159 U/L (ref 125–245)

## 2012-10-27 MED ORDER — SODIUM CHLORIDE 0.9 % IV SOLN
200.0000 mL | Freq: Once | INTRAVENOUS | Status: DC
  Administered 2012-10-27: 15:00:00 via INTRAVENOUS

## 2012-10-27 NOTE — Patient Instructions (Addendum)

## 2012-11-05 ENCOUNTER — Encounter: Payer: Self-pay | Admitting: Oncology

## 2012-11-05 ENCOUNTER — Ambulatory Visit (HOSPITAL_BASED_OUTPATIENT_CLINIC_OR_DEPARTMENT_OTHER): Payer: Medicare Other | Admitting: Oncology

## 2012-11-05 ENCOUNTER — Ambulatory Visit (HOSPITAL_COMMUNITY)
Admission: RE | Admit: 2012-11-05 | Discharge: 2012-11-05 | Disposition: A | Discharge status: Home / Self Care | Payer: Medicare Other | Source: Ambulatory Visit | Attending: Oncology | Admitting: Oncology

## 2012-11-05 ENCOUNTER — Telehealth: Payer: Self-pay | Admitting: Oncology

## 2012-11-05 ENCOUNTER — Other Ambulatory Visit (HOSPITAL_BASED_OUTPATIENT_CLINIC_OR_DEPARTMENT_OTHER): Payer: Medicare Other | Admitting: Lab

## 2012-11-05 DIAGNOSIS — R05 Cough: Secondary | ICD-10-CM

## 2012-11-05 DIAGNOSIS — F17200 Nicotine dependence, unspecified, uncomplicated: Secondary | ICD-10-CM | POA: Insufficient documentation

## 2012-11-05 DIAGNOSIS — R059 Cough, unspecified: Secondary | ICD-10-CM | POA: Insufficient documentation

## 2012-11-05 DIAGNOSIS — J4489 Other specified chronic obstructive pulmonary disease: Secondary | ICD-10-CM | POA: Insufficient documentation

## 2012-11-05 DIAGNOSIS — J449 Chronic obstructive pulmonary disease, unspecified: Secondary | ICD-10-CM | POA: Insufficient documentation

## 2012-11-05 LAB — CBC WITH DIFFERENTIAL/PLATELET
BASO%: 0.4 % (ref 0.0–2.0)
EOS%: 2.2 % (ref 0.0–7.0)
HCT: 38.4 % (ref 34.8–46.6)
MCH: 36 pg — ABNORMAL HIGH (ref 25.1–34.0)
MCHC: 34 g/dL (ref 31.5–36.0)
MCV: 105.8 fL — ABNORMAL HIGH (ref 79.5–101.0)
MONO%: 16.8 % — ABNORMAL HIGH (ref 0.0–14.0)
NEUT%: 49.9 % (ref 38.4–76.8)
RDW: 15.2 % — ABNORMAL HIGH (ref 11.2–14.5)
lymph#: 1.3 10*3/uL (ref 0.9–3.3)

## 2012-11-05 NOTE — Progress Notes (Signed)
This office note has been dictated.  #[id number]

## 2012-11-05 NOTE — Progress Notes (Signed)
CC:   Gabriel Earing, M.D.  PROBLEM LIST:  1. Hereditary hemochromatosis, homozygous for the C282Y gene with  diagnosis established in September 2009 at which time, the ferritin  level was 4484. The patient underwent a 1-unit phlebotomy on 05/18/2010  and on 12/07/2010.  She underwent a half-unit phlebotomy on 04/22/2012 and 09/29/2012.  The patient now receives 200 mL of normal saline with each half-unit phlebotomy. 2. History of hypertension.  3. Abnormal MRI of the liver showing increased iron deposition within  the liver as well as evidence of cirrhosis in October 2009.  4. Weight loss over 2013. The patient's baseline weight has been  running 120 pounds. On 09/29/2012 weight was 106 pounds.  5. Cough started in December of 2013. The patient's cough has resolved.  MEDICATIONS:  Reviewed and recorded. Current Outpatient Prescriptions  Medication Sig Dispense Refill  . amLODipine (NORVASC) 2.5 MG tablet Take 2.5 mg by mouth daily.        . calcium carbonate (OS-CAL) 600 MG TABS Take 600 mg by mouth daily.        Marland Kitchen co-enzyme Q-10 30 MG capsule Take 30 mg by mouth daily.      . metoprolol succinate (TOPROL-XL) 25 MG 24 hr tablet Take 25 mg by mouth daily.         No current facility-administered medications for this visit.    SMOKING HISTORY: The patient has smoked for over 50 years approximately  1 pack a day. In the last few weeks she has cut down to less than half  a pack a day.    HISTORY:  I saw Brenda Gardner today for followup of her hemochromatosis diagnosed in September 2009 when the patient presented with a ferritin level of 4484.  Brenda Gardner Was last seen by [place] on 09/29/2012.  She underwent a half-unit phlebotomy at that time.  Her most recent ferritin was on 10/27/2012 and was 84.  The patient tolerated her half-unit phlebotomy without any problems.  She tells me her cough has resolved. She is taking in 1 can of Ensure per day.  She has had some weight gain of a  couple of pounds.  The patient states that she has some generalized weakness in her legs which she attributes to inactivity.  She is without any complaints or problems today.  PHYSICAL EXAMINATION:  General:  The patient shows no major changes. Weight is 109 pounds 4.8 ounces as compared with 106 pounds on 09/29/2012.  Height 5 feet 5-1/2 inches, body surface area 1.51 sq m. Vital Signs:  Blood pressure today 162/86.  The patient was informed about her elevated blood pressure.  Other vital signs are normal. HEENT:  There is no scleral icterus.  Mouth and pharynx are benign.  No peripheral adenopathy palpable.  Heart and lungs:  Normal.  Abdomen: Suboptimal exam due to extreme sensitivity.  In the past I thought the liver edge could be felt 9-10 cm below the umbilicus.  Extremities:  No peripheral edema.  Neurologic:  Nonfocal.  The patient is still somewhat thin.  LABORATORY DATA:  Today, white count 4.3, ANC 2.2, hemoglobin 13.1, hematocrit 38.4, platelets 156,000.  Chemistries today are pending. Chemistries from 09/29/2012 were notable for an AST of 50, glucose 128. Albumin was 3.8 and LDH was 164,.  Iron studies today are pending.  If we go back to 04/05/2011, ferritin was 18; however, on 05/06/2012 ferritin was 228, on 09/29/2012 ferritin was 215, on 10/06/2012 156, on 10/20/2012 115, and on 10/27/2012  84.  Iron saturation was 60%.  It will be recalled the patient did have a half-unit phlebotomy on 10/27/2012.  IMAGING STUDIES:  1. MRI of the abdomen with and without IV contrast on 06/05/2008  showed marked iron deposition within the liver with sparing of the  spleen consistent with genetic hemochromatosis. There was atrophy  of the left lateral hepatic lobe with mild linear enhancement.  This was favored to be most consistent with cirrhosis rather than  hepatocellular carcinoma. There was mild iron deposition within  the pancreas.  2. Complete abdominal ultrasound of the abdomen  on 05/09/2009 showed  no gallstones within the gallbladder and a normal common bile duct.  There was heterogeneous echotexture of the liver, felt to be due to  hepatitis or cirrhosis. There was no focal hepatic mass. No  hydronephrosis or renal calculi.  3. Chest x-ray, 2 view, from 07/27/2009 showed mild cardiomegaly with  no acute findings.  4. MRI of the abdomen with and without IV contrast on 04/28/2012 showed a  decrease in the apparent degree of iron deposition within the nodular  appearing liver compatible with improvement and secondary evidence of  hemochromatosis. There were no enhancing lesions to suggest  hepatocellular carcinoma. There was mild pancreatic iron deposition.  There was evidence of left hepatic lobe atrophy with probable internal  fibrosis/scarring. The study was compared with the prior MRI from  06/05/2008 and the previous ultrasound from 05/09/2009. Consideration  of annual MRI surveillance was stated.    IMPRESSION AND PLAN:  [name] seems to be doing better with some weight gain and resolution of her cough.  She did not have a chest x-ray as we had suggested.  I have urged her to have a chest x-ray today.  I do not think she has had a chest x-ray since July 27, 2009, when that showed cardiomegaly, otherwise clear lungs.  As stated, the patient's most recent ferritin was 84 on 10/27/2012 on the day she had a half-unit phlebotomy.  We are awaiting today's iron studies.  We would like to keep the ferritin below 50.  If the patient requires another phlebotomy, we will arrange this.  Will plan to see Mrs. Mignogna again in about 3 months, at which time we will check CBC, chemistries, and iron studies.  Half-unit phlebotomies with normal saline 200 mL will be arranged on an as-needed basis.    ______________________________ Samul Dada, M.D. DSM/MEDQ  D:  11/05/2012  T:  11/05/2012  Job:  [id number]

## 2012-11-05 NOTE — Telephone Encounter (Signed)
gv and printed appt schedule for pt for June...pt going back to lab

## 2012-11-06 ENCOUNTER — Other Ambulatory Visit: Payer: Self-pay | Admitting: Oncology

## 2012-11-06 ENCOUNTER — Telehealth: Payer: Self-pay | Admitting: Medical Oncology

## 2012-11-06 DIAGNOSIS — R05 Cough: Secondary | ICD-10-CM

## 2012-11-06 NOTE — Telephone Encounter (Signed)
I attempted to call pt to let her know that her chest x-ray shows a possible nodule. Per the radiologist he recommends a CT to follow up. Pt's line is busy.

## 2012-11-10 ENCOUNTER — Other Ambulatory Visit: Payer: Self-pay | Admitting: Medical Oncology

## 2012-11-11 ENCOUNTER — Telehealth: Payer: Self-pay | Admitting: Medical Oncology

## 2012-11-11 NOTE — Telephone Encounter (Signed)
I called pt to let her know that Dr. Nelda Bucks is ordering a CT of the chest. Her chest x-ray shows a nodule and he wants to follow up. She voiced understanding.

## 2012-11-16 ENCOUNTER — Ambulatory Visit (HOSPITAL_COMMUNITY)
Admission: RE | Admit: 2012-11-16 | Discharge: 2012-11-16 | Disposition: A | Discharge status: Home / Self Care | Payer: Medicare Other | Source: Ambulatory Visit | Attending: Oncology | Admitting: Oncology

## 2012-11-16 DIAGNOSIS — J984 Other disorders of lung: Secondary | ICD-10-CM | POA: Insufficient documentation

## 2012-11-16 DIAGNOSIS — I700 Atherosclerosis of aorta: Secondary | ICD-10-CM | POA: Insufficient documentation

## 2012-11-16 DIAGNOSIS — R05 Cough: Secondary | ICD-10-CM

## 2012-11-16 DIAGNOSIS — R911 Solitary pulmonary nodule: Secondary | ICD-10-CM | POA: Insufficient documentation

## 2012-11-17 ENCOUNTER — Encounter: Payer: Self-pay | Admitting: Oncology

## 2012-11-17 ENCOUNTER — Telehealth: Payer: Self-pay

## 2012-11-17 ENCOUNTER — Other Ambulatory Visit: Payer: Self-pay

## 2012-11-17 NOTE — Telephone Encounter (Signed)
S/w pt that Dr Arline Asp saw her CT and wants to see pt next month to discuss results. POF to scheduler. Pt had no questions at present.

## 2012-11-17 NOTE — Progress Notes (Signed)
The CT scan of the chest done without IV contrast on 11/16/2012 was reviewed. There was no abnormality that corresponded with the suspected lesion seen on the chest x-ray from 11/05/2012.  There was however a left apical density located medially which measured 10 x 7 mm and could be a neoplasm or scarring.  I will need to review this with the patient and decide whether we go ahead with a PET scan or whether we followup with another CT scan without IV contrast in approximately 4 months. The lesion should be big enough to take up activity on the PET scan. If the lesion is hot, we probably will refer her to a surgeon. If the lesion is not hot, we will follow with another CT scan in approximately 4 months.

## 2012-11-19 ENCOUNTER — Telehealth: Payer: Self-pay | Admitting: Oncology

## 2012-11-19 NOTE — Telephone Encounter (Signed)
S/w the pt and he is aware of her April 2014 appts

## 2012-12-15 ENCOUNTER — Other Ambulatory Visit: Payer: Self-pay | Admitting: Family

## 2012-12-15 ENCOUNTER — Encounter: Payer: Self-pay | Admitting: Oncology

## 2012-12-15 ENCOUNTER — Telehealth: Payer: Self-pay | Admitting: Oncology

## 2012-12-15 ENCOUNTER — Ambulatory Visit (HOSPITAL_BASED_OUTPATIENT_CLINIC_OR_DEPARTMENT_OTHER): Payer: Medicare Other | Admitting: Oncology

## 2012-12-15 ENCOUNTER — Other Ambulatory Visit (HOSPITAL_BASED_OUTPATIENT_CLINIC_OR_DEPARTMENT_OTHER): Payer: Medicare Other | Admitting: Lab

## 2012-12-15 DIAGNOSIS — J984 Other disorders of lung: Secondary | ICD-10-CM

## 2012-12-15 DIAGNOSIS — F17200 Nicotine dependence, unspecified, uncomplicated: Secondary | ICD-10-CM

## 2012-12-15 DIAGNOSIS — R911 Solitary pulmonary nodule: Secondary | ICD-10-CM | POA: Insufficient documentation

## 2012-12-15 LAB — COMPREHENSIVE METABOLIC PANEL (CC13)
ALT: 14 U/L (ref 0–55)
AST: 30 U/L (ref 5–34)
Alkaline Phosphatase: 96 U/L (ref 40–150)
Sodium: 139 mEq/L (ref 136–145)
Total Bilirubin: 0.63 mg/dL (ref 0.20–1.20)
Total Protein: 7.5 g/dL (ref 6.4–8.3)

## 2012-12-15 LAB — CBC WITH DIFFERENTIAL/PLATELET
Basophils Absolute: 0 10*3/uL (ref 0.0–0.1)
EOS%: 1.3 % (ref 0.0–7.0)
HCT: 42.1 % (ref 34.8–46.6)
HGB: 13.9 g/dL (ref 11.6–15.9)
MCH: 33.5 pg (ref 25.1–34.0)
MCV: 101.9 fL — ABNORMAL HIGH (ref 79.5–101.0)
MONO%: 16.5 % — ABNORMAL HIGH (ref 0.0–14.0)
NEUT%: 54.5 % (ref 38.4–76.8)

## 2012-12-15 LAB — LACTATE DEHYDROGENASE (CC13): LDH: 162 U/L (ref 125–245)

## 2012-12-15 NOTE — Telephone Encounter (Signed)
Pt already on schedule for June lb/fu and made aware that [facility] will call w/June ct appt.

## 2012-12-15 NOTE — Progress Notes (Signed)
This office note has been dictated.  #[id number]

## 2012-12-16 ENCOUNTER — Other Ambulatory Visit: Payer: Self-pay | Admitting: Oncology

## 2012-12-16 NOTE — Progress Notes (Signed)
CC:   Gabriel Earing, M.D.  PROBLEM LIST:  1. Hereditary hemochromatosis, homozygous for the C282Y gene with  diagnosis established in September 2009 at which time, the ferritin  level was 4484. The patient underwent a 1-unit phlebotomy on 05/18/2010  and on 12/07/2010. She underwent a 1/2-unit phlebotomy on 04/22/2012, 09/29/2012, 10/06/2012, and 10/27/2012. The patient now receives 200 mL of normal saline with each half-unit phlebotomy.   2. History of hypertension.  3. Abnormal MRI of the liver showing increased iron deposition within  the liver as well as evidence of cirrhosis in October 2009.  4. Weight loss over 2013. The patient's baseline weight has been  running 120 pounds. On 09/29/2012 weight was 106 pounds.  5. Cough started in December of 2013. The patient's cough has resolved. 6. Nodule seen in the apex of the left lung on CT scan of the chest carried out without IV contrast on 11/16/2012.   MEDICATIONS:  Reviewed and recorded. Current Outpatient Prescriptions  Medication Sig Dispense Refill  . amLODipine (NORVASC) 2.5 MG tablet Take 2.5 mg by mouth daily.        . calcium carbonate (OS-CAL) 600 MG TABS Take 600 mg by mouth daily.        Marland Kitchen co-enzyme Q-10 30 MG capsule Take 30 mg by mouth daily.      . metoprolol succinate (TOPROL-XL) 25 MG 24 hr tablet Take 25 mg by mouth daily.         No current facility-administered medications for this visit.     TREATMENT PROGRAM:   Half-unit phlebotomies are being carried out with the infusion of 200 mL of normal saline whenever the ferritin is greater than 50.  SMOKING HISTORY: The patient has smoked for over 50 years approximately  1 pack a day. In the last few weeks she has cut down to less than half  a pack a day.    HISTORY:  Jericho Cieslik was seen today for a special appointment in order to discuss the results of the CT scan of the chest without IV contrast carried out on 11/16/2012.  The patient had been last seen by  [place] on 11/05/2012.  Ferritin on that date was 48, down from 84 on 10/27/2012. The patient had undergone several 1/2-unit phlebotomies over the past few months after her ferritin had gotten up to 228 on 05/06/2012.  The patient is here today to discuss the results of her CT scan of the chest carried out without IV contrast on 11/16/2012.  That CT scan has been reviewed by me with the radiologist.  There is a 10 x 7 mm left apical lesion felt to be suspicious for neoplasm.  Possibility of scarring could not be excluded.  The patient still has a slight cough and continues to smoke.  She has been taking in nutritional supplements and has regained at least some of her lost weight.  She is without new complaints today.  PHYSICAL EXAMINATION:  General:  There is little change.  She is 79 years old.  Weight is 112 pounds 8 ounces, height 5 feet 5-1/2 inches, body surface area 1.54 sq m.  Vital Signs:  Blood pressure 166/81. Other vital signs are normal.  The rest of her physical exam is unchanged from that recorded on 11/05/2012.  LABORATORY DATA:  Today, white count 5.7, ANC 3.1, hemoglobin 13.9, hematocrit 42.1, platelets 166,000.  Chemistries were normal.  Ferritin on 11/05/2012 was 48.  IMAGING STUDIES:  1. MRI of the abdomen with and  without IV contrast on 06/05/2008  showed marked iron deposition within the liver with sparing of the  spleen consistent with genetic hemochromatosis. There was atrophy  of the left lateral hepatic lobe with mild linear enhancement.  This was favored to be most consistent with cirrhosis rather than  hepatocellular carcinoma. There was mild iron deposition within  the pancreas.  2. Complete abdominal ultrasound of the abdomen on 05/09/2009 showed  no gallstones within the gallbladder and a normal common bile duct.  There was heterogeneous echotexture of the liver, felt to be due to  hepatitis or cirrhosis. There was no focal hepatic mass. No   hydronephrosis or renal calculi.  3. Chest x-ray, 2 view, from 07/27/2009 showed mild cardiomegaly with  no acute findings.  4. MRI of the abdomen with and without IV contrast on 04/28/2012 showed a  decrease in the apparent degree of iron deposition within the nodular  appearing liver compatible with improvement and secondary evidence of  hemochromatosis. There were no enhancing lesions to suggest  hepatocellular carcinoma. There was mild pancreatic iron deposition.  There was evidence of left hepatic lobe atrophy with probable internal  fibrosis/scarring. The study was compared with the prior MRI from  06/05/2008 and the previous ultrasound from 05/09/2009. Consideration  of annual MRI surveillance was stated. 5. Chest x-ray, 2 view, from 11/05/2012 showed COPD.  There was a possible sub-centimeter nodule in the left upper lobe.  Chest CT scan was recommended.  Chest x-ray was compared with the previous chest x-ray from 07/27/2009. 6. CT scan of the chest without IV contrast from 11/16/2012 showed a 10 x 7 mm left apical lesion suspicious for neoplasm.  Scarring could not be excluded.  There were underlying emphysematous changes without mediastinal or hilar lymphadenopathy.  There were moderate atherosclerotic calcifications involving the aorta and branch vessels.   IMPRESSION AND PLAN:  Today's session was fairly lengthy, lasting about 35-40 minutes.  I reviewed with the patient her chest x-ray and CT findings, along with the images.  After some discussion, we decided that we would repeat the CT scan of the chest without IV contrast in early June prior to the patient's scheduled appointment to see me on June 6th. On June 6th we will be checking CBC, chemistries, and iron studies. Will continue with the phlebotomy program as stated above, 1/2 unit with 200 mL of normal saline infusion, to try to keep the ferritin level below 50.  If the CT scan shows that the patient's lung  lesion is increasing, then we will probably obtain a PET scan and refer the patient to see a thoracic surgeon.    ______________________________ Samul Dada, M.D. DSM/MEDQ  D:  12/15/2012  T:  12/16/2012  Job:  [id number]

## 2012-12-17 ENCOUNTER — Other Ambulatory Visit: Payer: Self-pay | Admitting: Medical Oncology

## 2013-02-01 ENCOUNTER — Ambulatory Visit (HOSPITAL_COMMUNITY)
Admission: RE | Admit: 2013-02-01 | Discharge: 2013-02-01 | Disposition: A | Discharge status: Home / Self Care | Payer: Medicare Other | Source: Ambulatory Visit | Attending: Oncology | Admitting: Oncology

## 2013-02-01 DIAGNOSIS — J438 Other emphysema: Secondary | ICD-10-CM | POA: Insufficient documentation

## 2013-02-01 DIAGNOSIS — I2510 Atherosclerotic heart disease of native coronary artery without angina pectoris: Secondary | ICD-10-CM | POA: Insufficient documentation

## 2013-02-01 DIAGNOSIS — R911 Solitary pulmonary nodule: Secondary | ICD-10-CM | POA: Insufficient documentation

## 2013-02-01 DIAGNOSIS — F17200 Nicotine dependence, unspecified, uncomplicated: Secondary | ICD-10-CM | POA: Insufficient documentation

## 2013-02-04 ENCOUNTER — Other Ambulatory Visit: Payer: Self-pay | Admitting: Oncology

## 2013-02-04 ENCOUNTER — Encounter: Payer: Self-pay | Admitting: Oncology

## 2013-02-04 ENCOUNTER — Telehealth: Payer: Self-pay

## 2013-02-04 DIAGNOSIS — R911 Solitary pulmonary nodule: Secondary | ICD-10-CM

## 2013-02-04 NOTE — Progress Notes (Signed)
I reviewed once again the imaging studies on this patient. Her most recent CT scan of the chest done without IV contrast was carried out on 02/01/2013 and compared with the CT scan from 11/16/2012.  The lesion measuring 7 x 10 mm in the anterior left upper lobe is unchanged. I recommend a PET scan at this point. If the PET scan is positive for this nodule, the patient will need to be referred to a thoracic surgeon for further evaluation. If the nodule is not FDG avid, we may be able to simply follow her. It would not be easy for interventional radiology to perform a percutaneous biopsy of this lesion. It is in a difficult location for biopsy.   A PET scan has been ordered for around 02/09/2013.

## 2013-02-04 NOTE — Telephone Encounter (Signed)
S/w pt that Dr Arline Asp is ordering a PET scan. She stated it was already scheduled. She has an appt with Dr Arline Asp tomorrow and will ask him questions at that time

## 2013-02-05 ENCOUNTER — Telehealth: Payer: Self-pay | Admitting: Oncology

## 2013-02-05 ENCOUNTER — Encounter: Payer: Self-pay | Admitting: Oncology

## 2013-02-05 ENCOUNTER — Ambulatory Visit (HOSPITAL_BASED_OUTPATIENT_CLINIC_OR_DEPARTMENT_OTHER): Payer: Medicare Other | Admitting: Oncology

## 2013-02-05 ENCOUNTER — Other Ambulatory Visit (HOSPITAL_BASED_OUTPATIENT_CLINIC_OR_DEPARTMENT_OTHER): Payer: Medicare Other

## 2013-02-05 DIAGNOSIS — R599 Enlarged lymph nodes, unspecified: Secondary | ICD-10-CM

## 2013-02-05 DIAGNOSIS — R911 Solitary pulmonary nodule: Secondary | ICD-10-CM

## 2013-02-05 LAB — IRON AND TIBC: %SAT: 32 % (ref 20–55)

## 2013-02-05 LAB — CBC WITH DIFFERENTIAL/PLATELET
BASO%: 0.9 % (ref 0.0–2.0)
Basophils Absolute: 0 10*3/uL (ref 0.0–0.1)
EOS%: 1.3 % (ref 0.0–7.0)
HCT: 43.6 % (ref 34.8–46.6)
LYMPH%: 34 % (ref 14.0–49.7)
MCH: 32.5 pg (ref 25.1–34.0)
MCHC: 33.9 g/dL (ref 31.5–36.0)
MONO#: 0.7 10*3/uL (ref 0.1–0.9)
NEUT%: 49 % (ref 38.4–76.8)
Platelets: 141 10*3/uL — ABNORMAL LOW (ref 145–400)

## 2013-02-05 LAB — COMPREHENSIVE METABOLIC PANEL (CC13)
ALT: 15 U/L (ref 0–55)
Albumin: 3.9 g/dL (ref 3.5–5.0)
CO2: 27 mEq/L (ref 22–29)
Calcium: 9.7 mg/dL (ref 8.4–10.4)
Chloride: 103 mEq/L (ref 98–107)
Creatinine: 0.8 mg/dL (ref 0.6–1.1)
Potassium: 4 mEq/L (ref 3.5–5.1)

## 2013-02-05 LAB — LACTATE DEHYDROGENASE (CC13): LDH: 165 U/L (ref 125–245)

## 2013-02-05 NOTE — Telephone Encounter (Signed)
gv and printed appt sched and avs for pt  °

## 2013-02-05 NOTE — Progress Notes (Signed)
This office note has been dictated.  #[id number]

## 2013-02-06 NOTE — Progress Notes (Signed)
CC:   Brenda Gardner, M.D.  PROBLEM LIST:  1. Hereditary hemochromatosis, homozygous for the C282Y gene with  diagnosis established in September 2009 at which time, the ferritin  level was 4484. The patient underwent a 1-unit phlebotomy on 05/18/2010  and on 12/07/2010. She underwent a 1/2-unit phlebotomy on 04/22/2012, 09/29/2012, 10/06/2012, and 10/27/2012. The patient now receives 200 mL of normal saline with each half-unit phlebotomy.  2. History of hypertension.  3. Abnormal MRI of the liver showing increased iron deposition within  the liver as well as evidence of cirrhosis in October 2009. Hepatomegaly is present on physical exam. 4. Weight loss over 2013. The patient's baseline weight has been  running 120 pounds. On 09/29/2012 weight was 106 pounds.  5. Cough started in December of 2013. The patient's cough has resolved.  6. Nodule seen in the apex of the left lung on CT scan of the chest carried  out without IV contrast on 11/16/2012.    MEDICATIONS:  Reviewed and recorded. Current Outpatient Prescriptions  Medication Sig Dispense Refill  . amLODipine (NORVASC) 2.5 MG tablet Take 2.5 mg by mouth daily.        . calcium carbonate (OS-CAL) 600 MG TABS Take 600 mg by mouth daily.        . metoprolol succinate (TOPROL-XL) 25 MG 24 hr tablet Take 25 mg by mouth daily.         No current facility-administered medications for this visit.    TREATMENT PROGRAM:  Half-unit phlebotomies are being carried out with the infusion of 200 mL of normal saline whenever the ferritin is greater than 50.    SMOKING HISTORY: The patient has smoked for over 50 years approximately  1 pack a day. In the last few weeks she has cut down to less than half  a pack a day.    HISTORY:  I saw Brenda Gardner today for followup of the nodule seen on CT scan of the chest carried out on 11/16/2012.  This nodule was located in the anterior apical region of the left upper lobe.  The patient's clinical status  remains the same.  There have been no changes.  She was last seen by [place] on 12/15/2012.  She has not required any phlebotomies since that time.  She did undergo a CT scan of the chest without IV contrast on 02/01/2013 to follow up on the abnormal CT scan of the chest from 11/16/2012.  The 7 x 10-mm spiculated nodule in the anterior left upper lobe is stable.  A PET scan was suggested.  I have reviewed the CT scan with the radiologist.  Today the patient was given a copy of the report, and we had an extensive discussion about the report and my recommendations for following up on this lesion.  The patient continues to smoke approximately half-a-pack of cigarettes a day.  She does have a slight cough, but the severe cough she had a few months ago has Resolved.   PHYSICAL EXAMINATION:  General:  The patient shows no major changes. Vital Signs:  Weight is 110 pounds 9.6 ounces, height 5 feet 5 inches, body surface area 1.52 meters squared.  Blood pressure today 188/91. The patient was informed of her elevated blood pressure.  Other vital signs are normal.  HEENT:  There is no scleral icterus.  Mouth and pharynx are benign.  Lymphatic:  No peripheral adenopathy palpable. Heart and lungs:  Normal.  Abdomen:  Reveals a soft liver edge that can be felt  at about the level of the umbilicus.  This measures about 8-10 cm below the right costal margin in the midclavicular line.  No splenomegaly, abdominal masses, or ascites.  Extremities:  No peripheral edema or clubbing.  Neurologic exam:  Normal.  The patient is generally thin.  LABORATORY DATA TODAY:  White count 4.6, ANC 2.2, hemoglobin 14.8, hematocrit 43.6, platelets 141,000.  Chemistries and iron studies today are pending.  Chemistries from 12/15/2012 were essentially normal. Ferritin on 12/15/2012 was 33, and the iron saturation was 20%.  The patient's most recent 0.5-unit phlebotomy took place on 10/27/2012.  IMAGING STUDIES:  1. MRI of  the abdomen with and without IV contrast on 06/05/2008  showed marked iron deposition within the liver with sparing of the  spleen consistent with genetic hemochromatosis. There was atrophy  of the left lateral hepatic lobe with mild linear enhancement.  This was favored to be most consistent with cirrhosis rather than  hepatocellular carcinoma. There was mild iron deposition within  the pancreas.  2. Complete abdominal ultrasound of the abdomen on 05/09/2009 showed  no gallstones within the gallbladder and a normal common bile duct.  There was heterogeneous echotexture of the liver, felt to be due to  hepatitis or cirrhosis. There was no focal hepatic mass. No  hydronephrosis or renal calculi.  3. Chest x-ray, 2 view, from 07/27/2009 showed mild cardiomegaly with  no acute findings.  4. MRI of the abdomen with and without IV contrast on 04/28/2012 showed a  decrease in the apparent degree of iron deposition within the nodular  appearing liver compatible with improvement and secondary evidence of  hemochromatosis. There were no enhancing lesions to suggest  hepatocellular carcinoma. There was mild pancreatic iron deposition.  There was evidence of left hepatic lobe atrophy with probable internal  fibrosis/scarring. The study was compared with the prior MRI from  06/05/2008 and the previous ultrasound from 05/09/2009. Consideration  of annual MRI surveillance was stated.  5. Chest x-ray, 2 view, from 11/05/2012 showed COPD. There was a possible  sub-centimeter nodule in the left upper lobe. Chest CT scan was  recommended. Chest x-ray was compared with the previous chest x-ray  from 07/27/2009.  6. CT scan of the chest without IV contrast from 11/16/2012 showed a 10 x 7  mm left apical lesion suspicious for neoplasm. Scarring could not be  excluded. There were underlying emphysematous changes without  mediastinal or hilar lymphadenopathy. There were moderate  atherosclerotic  calcifications involving the aorta and branch vessels. 7. CT scan of the chest without IV contrast on 02/01/2013 showed stable appearance of the 7 x 10-mm spiculated nodule in the anterior left upper lobe.  A PET CT scan was suggested.     IMPRESSION AND PLAN:  The patient and I had a lengthy discussion about management of the nodule seen in the left apex.  At this point the patient should have a PET scan, which she has agreed to; this has been scheduled for Tuesday, 02/09/2013.  If the PET scan shows metabolic activity, then I believe the patient should be referred for consideration of surgery.  In reviewing the CT scans with a radiologist, it would not be very easy and somewhat dangerous to try to perform a percutaneous needle biopsy of this lesion, which is quite medial and in the apex.  If the lesion is not PET-avid, then we will probably continue to follow the patient with CT scans of the chest without IV contrast. The next scan would  probably be probably be done in about 6 months, which would be December.  In the meantime, regarding the patient's hemochromatosis, we will check CBCs and ferritin every 2 months.  That has been put in as a standing order.  She will probably require a 0.5-unit phlebotomy with infusion of 200 mL of normal saline if the ferritin gets much above 50.  The patient will be seen again in 6 months, at which time she should have a CBC, chemistries, and iron studies.  A CT scan of the chest without IV contrast may be indicated at that time.  Today's session lasted about 40 minutes and was spent mostly in discussion with the patient.    ______________________________ Samul Dada, M.D. DSM/MEDQ  D:  02/05/2013  T:  02/06/2013  Job:  [id number]

## 2013-02-09 ENCOUNTER — Encounter (HOSPITAL_COMMUNITY)
Admission: RE | Admit: 2013-02-09 | Discharge: 2013-02-09 | Disposition: A | Discharge status: Home / Self Care | Payer: Medicare Other | Source: Ambulatory Visit | Attending: Oncology | Admitting: Oncology

## 2013-02-09 DIAGNOSIS — I2510 Atherosclerotic heart disease of native coronary artery without angina pectoris: Secondary | ICD-10-CM | POA: Insufficient documentation

## 2013-02-09 DIAGNOSIS — K769 Liver disease, unspecified: Secondary | ICD-10-CM | POA: Insufficient documentation

## 2013-02-09 DIAGNOSIS — R911 Solitary pulmonary nodule: Secondary | ICD-10-CM | POA: Insufficient documentation

## 2013-02-09 MED ORDER — FLUDEOXYGLUCOSE F - 18 (FDG) INJECTION
18.6000 | Freq: Once | INTRAVENOUS | Status: AC | PRN
  Administered 2013-02-09: 18.6 via INTRAVENOUS

## 2013-02-11 ENCOUNTER — Telehealth: Payer: Self-pay | Admitting: Medical Oncology

## 2013-02-11 ENCOUNTER — Encounter: Payer: Self-pay | Admitting: Oncology

## 2013-02-11 NOTE — Progress Notes (Signed)
This patient is being referred to Dr. Edwina Barth whom I spoke a couple of days ago.  The nodule in the left apex looks malignant and has an SUV max of 4.4. There was no other metabolic activity noted. This lesion  needs to be resected.

## 2013-02-11 NOTE — Telephone Encounter (Signed)
I called pt per Dr. Arline Asp to inform pt that the nodule was positive on PET scan. He would like to know if she would like to be referred to Dr. Dorris Fetch for evaluation. She states that she would be fine with this but not until after the 23rd. Her mother is 30 and she is moving her in to a nursing home and she has to get this done next week. Dr. Arline Asp notified and referral faxed to Dr. Sunday Corn office.

## 2013-02-12 ENCOUNTER — Telehealth: Payer: Self-pay

## 2013-02-12 NOTE — Telephone Encounter (Signed)
No answer at pt or alternate phone numbers. Pt has appt at [facility] June 24 at 930 am with Dr Dorris Fetch

## 2013-02-15 ENCOUNTER — Telehealth: Payer: Self-pay | Admitting: Medical Oncology

## 2013-02-15 NOTE — Telephone Encounter (Signed)
I called pt to inform her appointment with Dr. Dorris Fetch is 02/23/13 at 930 am. I gave her the address and phone number for Dr. Dorris Fetch. She voiced information back to me.

## 2013-02-23 ENCOUNTER — Encounter: Payer: Medicare Other | Admitting: Thoracic Surgery (Cardiothoracic Vascular Surgery)

## 2013-02-26 ENCOUNTER — Encounter: Payer: Medicare Other | Admitting: Thoracic Surgery (Cardiothoracic Vascular Surgery)

## 2013-03-03 ENCOUNTER — Other Ambulatory Visit: Payer: Self-pay | Admitting: *Deleted

## 2013-03-09 ENCOUNTER — Ambulatory Visit (INDEPENDENT_AMBULATORY_CARE_PROVIDER_SITE_OTHER): Payer: Medicare Other | Admitting: Thoracic Surgery (Cardiothoracic Vascular Surgery)

## 2013-03-09 ENCOUNTER — Encounter: Payer: Self-pay | Admitting: *Deleted

## 2013-03-09 ENCOUNTER — Encounter: Payer: Self-pay | Admitting: Thoracic Surgery (Cardiothoracic Vascular Surgery)

## 2013-03-09 ENCOUNTER — Other Ambulatory Visit: Payer: Self-pay | Admitting: *Deleted

## 2013-03-09 VITALS — BP 196/103 | HR 108 | Resp 16 | Ht 65.0 in | Wt 110.0 lb

## 2013-03-09 DIAGNOSIS — R918 Other nonspecific abnormal finding of lung field: Secondary | ICD-10-CM

## 2013-03-09 DIAGNOSIS — R911 Solitary pulmonary nodule: Secondary | ICD-10-CM

## 2013-03-09 NOTE — Progress Notes (Signed)
PCP is Teena Irani, PA-C Referring Provider is Murinson, Gerarda Fraction, MD  Chief Complaint  Patient presents with  . Lung Lesion    Surgical eval for  Left upper lobe lung nodule, PET Scan 02/09/13, Chest CT 02/01/2013    HPI: 79 year old gentleman sent for consultation regarding a left upper lobe mass.  Brenda Gardner is a 79 year old woman with a history of tobacco abuse who was found earlier this year to have a left upper lobe nodule. Brenda Gardner was followed by Dr. Arline Asp for a hemochromatosis. Brenda Gardner saw him earlier this year and he recommended a chest x-ray should Brenda Gardner had not had one in while. That showed a questionable left upper lobe nodule and led to a CT scan, which confirmed a spiculated 9.9 mm left upper lobe mass. A repeat CT was done in June and showed the mass was still present. That led to a PET/CT which showed the lesion to be hypermetabolic with an SUV of 4.4.  Brenda Gardner has a complex smoking history. Brenda Gardner started when Brenda Gardner was 18 this issue does smoke occasionally. Brenda Gardner says Brenda Gardner began smoking heavily at age 49, up to 2 packs per day. Brenda Gardner currently has cut back and is now smoking about one third of a pack per day.  Brenda Gardner is not very physically active. Brenda Gardner does get short of breath with walking up inclines. Brenda Gardner does feel that Brenda Gardner get short of breath walking up a flight of stairs. Brenda Gardner denies any chest pain, pressure, or tightness. Brenda Gardner does have hypertension but no other significant cardiovascular history. Brenda Gardner denies frequent cough, hemoptysis, wheezing.  Past Medical History  Diagnosis Date  . Hypertension   . Hemochromatosis   cirrhosis  Past Surgical History  Procedure Laterality Date  . Tubal ligation    . Appendectomy    . Tonsillectomy      No family history on file.  Social History History  Substance Use Topics  . Smoking status: Current Every Day Smoker -- 0.33 packs/day for 50 years    Types: Cigarettes  . Smokeless tobacco: Never Used  . Alcohol Use: Yes    Current Outpatient  Prescriptions  Medication Sig Dispense Refill  . amLODipine (NORVASC) 2.5 MG tablet Take 2.5 mg by mouth daily.        . calcium carbonate (OS-CAL) 600 MG TABS Take 600 mg by mouth daily.        . metoprolol succinate (TOPROL-XL) 25 MG 24 hr tablet Take 25 mg by mouth daily.         No current facility-administered medications for this visit.    Allergies  Allergen Reactions  . Lisinopril Swelling    Swelling of tongue    Review of Systems  Constitutional: Positive for appetite change (mild decrease, taking ensure) and fatigue.  Eyes: Positive for visual disturbance (blurred due to scar tissue after catarract surgery).  Respiratory: Positive for shortness of breath (with exertion- walking up a flight of stairs). Negative for chest tightness.   Cardiovascular: Negative for chest pain, palpitations and leg swelling.  Musculoskeletal: Positive for joint swelling (MIP left index finger).  Hematological: Bruises/bleeds easily.  All other systems reviewed and are negative.    BP 196/103  Pulse 108  Resp 16  Ht 5\' 5"  (1.651 m)  Wt 110 lb (49.896 kg)  BMI 18.31 kg/m2  SpO2 97% Physical Exam  Vitals reviewed. Constitutional: Brenda Gardner is oriented to person, place, and time.  Thin  HENT:  Head: Normocephalic and atraumatic.  Eyes: EOM are normal.  Pupils are equal, round, and reactive to light.  Neck: Normal range of motion. Neck supple. No thyromegaly present.  Cardiovascular: Normal rate, regular rhythm, normal heart sounds and intact distal pulses.  Exam reveals no gallop and no friction rub.   No murmur heard. Pulmonary/Chest: Effort normal. Brenda Gardner has no wheezes. Brenda Gardner has no rales.  Diminished breath sounds bilaterally  Abdominal: Soft. Bowel sounds are normal. There is no tenderness.  Musculoskeletal: Normal range of motion. Brenda Gardner exhibits no edema.  Lymphadenopathy:    Brenda Gardner has no cervical adenopathy.  Neurological: Brenda Gardner is alert and oriented to person, place, and time. No cranial  nerve deficit.  Tremor No focal motor deficits  Skin: Skin is warm and dry.  Psychiatric:  Anxious     Diagnostic Tests: CT of chest 02/01/2013 CT CHEST WITHOUT CONTRAST  Technique: Multidetector CT imaging of the chest was performed  following the standard protocol without IV contrast.  Comparison: 11/16/2012  Findings: There is no axillary, mediastinal, or hilar  lymphadenopathy. Heart size is upper normal. Coronary artery  calcification is noted. No pericardial or pleural effusion.  Lung windows demonstrate emphysema. No pulmonary parenchymal  nodule or mass in the right lung. The 10 mm subpleural anterior  left upper lobe spiculated nodule is again identified, measuring 10  mm in longest axis today. This lesion is unchanged subjectively  and does not measured differently than it did on the previous  study. No other nodule or mass lesion identified in the left lung.  There is no focal airspace consolidation in either lung.  Images which include the upper abdomen show no significant  abnormality within the upper abdomen.  Bone windows reveal no worrisome lytic or sclerotic osseous  lesions.  IMPRESSION:  Stable appearance of the 7 x 10 mm spiculated nodule in the  anterior left upper lobe. PET CT could be used to assess for  hypermetabolic activity within the nodule. If follow-up PET CT is  not performed, continued CT followup is recommended.  Emphysema.  Original Report Authenticated By: Kennith Center, M.D.   PET/CT 02/09/13 *RADIOLOGY REPORT*  Clinical Data: Initial treatment strategy for pulmonary nodule.  NUCLEAR MEDICINE PET SKULL BASE TO THIGH  Fasting Blood Glucose: 102  Technique: 18.6 mCi F-18 FDG was injected intravenously. CT data  was obtained and used for attenuation correction and anatomic  localization only. (This was not acquired as a diagnostic CT  examination.) Additional exam technical data entered on  technologist worksheet.  Comparison: 02/01/2013   Findings:  Neck: No hypermetabolic nodule or mass noted.  Chest: Within the anterior medial left upper lobe there is a  subpleural nodule which exhibits increased FDG uptake. The SUV max  is within the malignant range equal to 4.4, image 60. There is no  pleural effusion. No additional suspicious pulmonary nodules or  masses noted.  Calcified atherosclerotic disease affects the LAD, left circumflex  and RCA coronary arteries. Mild cardiac enlargement. No  pericardial effusions. There is no hypermetabolic mediastinal or  hilar lymph nodes identified.  Abdomen/Pelvis: The liver has an irregular and nodular contour  suggestive of cirrhosis.  Skeleton: No focal hypermetabolic activity to suggest skeletal  metastasis.  IMPRESSION:  1. Malignant range FDG uptake is associated with the anterior  medial left upper lobe nodule. This is concerning for small  primary pulmonary neoplasm.  2. Suspect liver cirrhosis.  3. Prominent coronary artery calcifications  Original Report Authenticated By: Signa Kell, M.D.  Impression: 79 year old woman with smoking history who has a  7 x 10 mm spiculated left upper lobe nodule. This is hypermetabolic by PET CT. This lesion almost certainly represents a primary bronchogenic carcinoma and needs to be considered lung cancer until proven otherwise. The only way to definitively proven otherwise would be with an excisional biopsy. Given the location of this lesion and its close proximity to the left subclavian artery I do not think that Brenda Gardner would be a candidate for percutaneous biopsy. Given her age, hemochromatosis, cirrhosis and presence of coronary calcification on CT Brenda Gardner does have surgical risk, however I do not feel it is prohibitive.  My recommendation to her is to proceed with a left VATS, wedge resection, and lingular sparing lobectomy if the frozen section was positive. I think this lesion would be difficult to treat with radiation given its close  proximity to the left subclavian. I also think it would be extremely difficult to obtain a biopsy for the same reason.  I discussed in detail with her the nature of the procedure including the incisions to be used, the need for general anesthesia, expected hospital stay, and the overall recovery. We discussed the indications, risks, benefits, and alternatives. Brenda Gardner understands the risk include but are not limited to death, MI, stroke, DVT, PE, bleeding, possible need for transfusion, infection, prolonged air leak, cardiac arrhythmias, as well as the possibility of unforeseeable complications.  Brenda Gardner is very reluctant to consider surgery and says that her major concern is anesthesia. Brenda Gardner heard of a young man dying from anesthesia during a cosmetic surgical procedure. Brenda Gardner has had 5 children and has had 5 previous surgeries and has not had any significant anesthetic related complications. Despite that Brenda Gardner remains very concerned about the risk of anesthesia. Brenda Gardner also is concerned about the emotional intact would have on her 27 year old mother who was just placed into assisted living.   Plan: Pulmonary function testing with room air blood gas  Given her reluctance to proceed with surgery I asked her to think about our discussion and talked to her family and return to see me in 2 weeks for further discussion.

## 2013-03-10 ENCOUNTER — Telehealth: Payer: Self-pay | Admitting: *Deleted

## 2013-03-10 NOTE — Telephone Encounter (Signed)
I called patient to f/u on her appointment with Dr. Dorris Fetch 03/09/13.  Patient denies any questions or concerns at this time.  Patient was given my contact  Information and encouraged to call for any needs.

## 2013-03-15 ENCOUNTER — Encounter (HOSPITAL_COMMUNITY): Payer: Medicare Other

## 2013-03-23 ENCOUNTER — Ambulatory Visit (INDEPENDENT_AMBULATORY_CARE_PROVIDER_SITE_OTHER): Payer: Medicare Other | Admitting: Thoracic Surgery (Cardiothoracic Vascular Surgery)

## 2013-03-23 ENCOUNTER — Other Ambulatory Visit: Payer: Self-pay | Admitting: *Deleted

## 2013-03-23 ENCOUNTER — Encounter: Payer: Self-pay | Admitting: Thoracic Surgery (Cardiothoracic Vascular Surgery)

## 2013-03-23 VITALS — BP 173/91 | HR 100 | Resp 20 | Ht 65.0 in | Wt 110.0 lb

## 2013-03-23 DIAGNOSIS — R911 Solitary pulmonary nodule: Secondary | ICD-10-CM

## 2013-03-23 NOTE — Progress Notes (Signed)
Patient ID: Brenda Gardner, female   DOB: 02-24-1933, 79 y.o.   MRN: [id number]  [name] returns today for further discussion regarding a left upper lobe nodule.  She is a 79 year old woman with a history of hemachromatosis who is followed by Dr. Arline Asp. Earlier this year he did a chest x-ray which showed a suspicion of the left upper lobe nodule. A CT scan was done which showed a spiculated left upper lobe nodule measuring 6.6 x 9.9 mm. She had a PET/CT which showed the lesion to be hypermetabolic.  Had a long discussion with [name] her last visit recommending that we resect this nodule surgically. She was very reluctant to consider surgery and wished to have some time to discuss it with her family and now returns to further discuss the operation.  He had another lengthy discussion (greater than 15 minutes). Apparently there was some confusion after a last discussion as to how best to progress. I made it clear that my recommendation was that we proceed with a left VATS, wedge resection of the nodule, and then possibly a segmentectomy if it is malignant. There really is no other equivalent alternative. This lesion is in a location that is not amenable to percutaneous or bronchoscopic biopsy. It is a location that if it progresses, it could rapidly locally invaded the chest wall and neurovascular structures in the vicinity.  I reviewed again the indications, risks, benefits, and alternatives. She understands the risk include but are not limited to death, MI, DVT, PE, bleeding, possible need for transfusion, infection, prolonged air leak, cardiac arrhythmias, or other unforeseeable complications. It would require general anesthesia. I would expect her to be in the hospital about 4 days. She will need a great deal of assistance for the first 2-3 weeks after discharge. She again reiterated her fears about having anesthesia, which is really her sticking point not the actual surgical procedure. I  offered to arrange an appointment for her with an anesthesiologist to discuss her fears, but she does not want to do that.  She continues to be concerned about the fact that this may have on her 10 year old mother who was recently institutionalized.  After a lengthy discussion [name] has agreed to have her pulmonary function tests and agrees to proceed with the surgery. We have set a tentative for Wednesday, August 13.

## 2013-04-01 ENCOUNTER — Encounter (HOSPITAL_COMMUNITY): Payer: Self-pay | Admitting: Pharmacy Technician

## 2013-04-02 ENCOUNTER — Other Ambulatory Visit (HOSPITAL_BASED_OUTPATIENT_CLINIC_OR_DEPARTMENT_OTHER): Payer: Medicare Other | Admitting: Lab

## 2013-04-02 ENCOUNTER — Telehealth: Payer: Self-pay

## 2013-04-02 LAB — CBC WITH DIFFERENTIAL/PLATELET
BASO%: 0.1 % (ref 0.0–2.0)
EOS%: 0.8 % (ref 0.0–7.0)
HCT: 46.3 % (ref 34.8–46.6)
LYMPH%: 29.2 % (ref 14.0–49.7)
MCH: 32.4 pg (ref 25.1–34.0)
MCHC: 33.3 g/dL (ref 31.5–36.0)
MCV: 97.5 fL (ref 79.5–101.0)
MONO%: 14.4 % — ABNORMAL HIGH (ref 0.0–14.0)
NEUT%: 55.5 % (ref 38.4–76.8)
Platelets: 150 10*3/uL (ref 145–400)
RBC: 4.75 10*6/uL (ref 3.70–5.45)

## 2013-04-02 NOTE — Telephone Encounter (Signed)
Attempted to call pt her labs this month are normal. Her next lab appt is 10/3 at 1130

## 2013-04-12 ENCOUNTER — Encounter (HOSPITAL_COMMUNITY): Payer: Medicare Other

## 2013-04-12 ENCOUNTER — Other Ambulatory Visit (HOSPITAL_COMMUNITY): Payer: Medicare Other

## 2013-04-14 ENCOUNTER — Encounter (HOSPITAL_COMMUNITY): Admission: RE | Payer: Self-pay | Source: Ambulatory Visit

## 2013-04-14 ENCOUNTER — Inpatient Hospital Stay (HOSPITAL_COMMUNITY)
Admission: RE | Admit: 2013-04-14 | Payer: Medicare Other | Source: Ambulatory Visit | Admitting: Thoracic Surgery (Cardiothoracic Vascular Surgery)

## 2013-04-14 SURGERY — VIDEO ASSISTED THORACOSCOPY (VATS)/WEDGE RESECTION
Anesthesia: General | Site: Chest | Laterality: Left

## 2013-06-03 ENCOUNTER — Other Ambulatory Visit: Payer: Self-pay | Admitting: Medical Oncology

## 2013-06-04 ENCOUNTER — Other Ambulatory Visit (HOSPITAL_BASED_OUTPATIENT_CLINIC_OR_DEPARTMENT_OTHER): Payer: Medicare Other

## 2013-06-04 LAB — LACTATE DEHYDROGENASE (CC13): LDH: 196 U/L (ref 125–245)

## 2013-06-04 LAB — IRON AND TIBC CHCC
%SAT: 87 % — ABNORMAL HIGH (ref 21–57)
Iron: 236 ug/dL — ABNORMAL HIGH (ref 41–142)

## 2013-06-04 LAB — COMPREHENSIVE METABOLIC PANEL (CC13)
Albumin: 3.9 g/dL (ref 3.5–5.0)
Alkaline Phosphatase: 87 U/L (ref 40–150)
Chloride: 104 mEq/L (ref 98–109)
Glucose: 88 mg/dl (ref 70–140)
Sodium: 142 mEq/L (ref 136–145)
Total Bilirubin: 0.94 mg/dL (ref 0.20–1.20)
Total Protein: 7.9 g/dL (ref 6.4–8.3)

## 2013-06-04 LAB — CBC WITH DIFFERENTIAL/PLATELET
BASO%: 1 % (ref 0.0–2.0)
EOS%: 1.6 % (ref 0.0–7.0)
Eosinophils Absolute: 0.1 10*3/uL (ref 0.0–0.5)
HGB: 15.5 g/dL (ref 11.6–15.9)
LYMPH%: 30.2 % (ref 14.0–49.7)
MCH: 33.1 pg (ref 25.1–34.0)
MCHC: 33.5 g/dL (ref 31.5–36.0)
MCV: 99 fL (ref 79.5–101.0)
MONO%: 13.1 % (ref 0.0–14.0)
Platelets: 150 10*3/uL (ref 145–400)
RBC: 4.67 10*6/uL (ref 3.70–5.45)
lymph#: 1.4 10*3/uL (ref 0.9–3.3)

## 2013-08-06 ENCOUNTER — Other Ambulatory Visit: Payer: Self-pay | Admitting: Internal Medicine

## 2013-08-06 ENCOUNTER — Ambulatory Visit: Payer: Medicare Other | Admitting: Lab

## 2013-08-06 ENCOUNTER — Ambulatory Visit (HOSPITAL_BASED_OUTPATIENT_CLINIC_OR_DEPARTMENT_OTHER): Payer: Medicare Other | Admitting: Internal Medicine

## 2013-08-06 ENCOUNTER — Other Ambulatory Visit (HOSPITAL_BASED_OUTPATIENT_CLINIC_OR_DEPARTMENT_OTHER): Payer: Medicare Other | Admitting: Lab

## 2013-08-06 ENCOUNTER — Telehealth: Payer: Self-pay | Admitting: Internal Medicine

## 2013-08-06 DIAGNOSIS — R634 Abnormal weight loss: Secondary | ICD-10-CM

## 2013-08-06 DIAGNOSIS — F17200 Nicotine dependence, unspecified, uncomplicated: Secondary | ICD-10-CM

## 2013-08-06 DIAGNOSIS — Z720 Tobacco use: Secondary | ICD-10-CM

## 2013-08-06 DIAGNOSIS — R911 Solitary pulmonary nodule: Secondary | ICD-10-CM

## 2013-08-06 DIAGNOSIS — I10 Essential (primary) hypertension: Secondary | ICD-10-CM

## 2013-08-06 LAB — CBC WITH DIFFERENTIAL/PLATELET
BASO%: 0.5 % (ref 0.0–2.0)
Basophils Absolute: 0 10*3/uL (ref 0.0–0.1)
EOS%: 0.6 % (ref 0.0–7.0)
HCT: 46.9 % — ABNORMAL HIGH (ref 34.8–46.6)
HGB: 15.6 g/dL (ref 11.6–15.9)
LYMPH%: 27.4 % (ref 14.0–49.7)
MCH: 34.1 pg — ABNORMAL HIGH (ref 25.1–34.0)
MCV: 102.3 fL — ABNORMAL HIGH (ref 79.5–101.0)
MONO%: 15.6 % — ABNORMAL HIGH (ref 0.0–14.0)
NEUT%: 55.9 % (ref 38.4–76.8)
Platelets: 164 10*3/uL (ref 145–400)
RBC: 4.58 10*6/uL (ref 3.70–5.45)

## 2013-08-06 LAB — COMPREHENSIVE METABOLIC PANEL (CC13)
ALT: 16 U/L (ref 0–55)
Alkaline Phosphatase: 99 U/L (ref 40–150)
Anion Gap: 13 mEq/L — ABNORMAL HIGH (ref 3–11)
CO2: 27 mEq/L (ref 22–29)
Chloride: 100 mEq/L (ref 98–109)
Creatinine: 0.8 mg/dL (ref 0.6–1.1)
Total Bilirubin: 0.75 mg/dL (ref 0.20–1.20)
Total Protein: 8.3 g/dL (ref 6.4–8.3)

## 2013-08-06 LAB — IRON AND TIBC CHCC
Iron: 142 ug/dL (ref 41–142)
TIBC: 303 ug/dL (ref 236–444)
UIBC: 161 ug/dL (ref 120–384)

## 2013-08-06 MED ORDER — AMLODIPINE BESYLATE 2.5 MG PO TABS: 2.5000 mg | ORAL_TABLET | Freq: Every day | ORAL | Status: DC

## 2013-08-06 MED ORDER — METOPROLOL SUCCINATE ER 25 MG PO TB24: 25.0000 mg | ORAL_TABLET | Freq: Every day | ORAL | Status: DC

## 2013-08-06 NOTE — Progress Notes (Signed)
[facility] OFFICE PROGRESS NOTE  Teena Irani, PA-C [address] [place] [address]  DIAGNOSIS: Hemochromatosis, hereditary  Hypertension - Plan: amLODipine (NORVASC) 2.5 MG tablet, metoprolol succinate (TOPROL-XL) 25 MG 24 hr tablet  Other hemochromatosis - Plan: CBC with Differential, Comprehensive metabolic panel, Iron and TIBC, Ferritin  Weight loss  Nodule of left lung  Tobacco abuse  Chief Complaint  Patient presents with  . Hemochromatosis, hereditary   CURRENT THERAPY: Phlebotomy to maintain ferritin less than 50.   INTERVAL HISTORY: Janeisha Ryle 79 y.o. female with a history of hypertension, hereditary hemochromatosis, homozygous for the C282Y gene since September 2009 who is here for follow-up.  She was last seen by Dr. Arline Asp on 02/05/2013.   She was referred to Dr. Edwina Barth secondary to nodule in the left apex that appeared to be malignant and had an SUV of 4.4 without additional metabolic activity.  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  This caused substantial delay for her follow with Dr. Dorris Fetch.  She also reports significant anxiety related to anticipated anesthesia.  She has cut back significantly on her smoking.  She is now down to 1/3 pack per day.  She denies a colonoscopy or mammogram and states she has declined them for years.   She is taking ensure once a day for her weight lost.  In addition, she has been off her blood pressure medication for the past few weeks.  She denies any recent hospitalizations or emergency room visits.    MEDICAL HISTORY: Past Medical History  Diagnosis Date  . Hypertension   . Hemochromatosis     INTERIM HISTORY: has Hemochromatosis, hereditary; Weight loss; Cough; Nodule of left lung; Hypertension; and Tobacco abuse on her problem list.    ALLERGIES:  is allergic to lisinopril.  MEDICATIONS: has a  current medication list which includes the following prescription(s): multivitamin, amlodipine, and metoprolol succinate.  SURGICAL HISTORY:  Past Surgical History  Procedure Laterality Date  . Tubal ligation    . Appendectomy    . Tonsillectomy     PROBLEM LIST:  1. Hereditary hemochromatosis, homozygous for the C282Y gene with diagnosis established in September 2009 at which time, the ferritin level was 4484. The patient underwent a 1-unit phlebotomy on 05/18/2010 and on 12/07/2010. She underwent a 1/2-unit phlebotomy on 04/22/2012, 09/29/2012, 10/06/2012, and 10/27/2012. The patient now receives 200 mL of normal saline with each half-unit phlebotomy.  2. History of hypertension.  3. Abnormal MRI of the liver showing increased iron deposition within the liver as well as evidence of cirrhosis in October 2009. Hepatomegaly is present on physical exam.  4. Weight loss over 2013. The patient's baseline weight has been running 120 pounds. On 09/29/2012 weight was 106 pounds.  5. Cough started in December of 2013. The patient's cough has resolved.  6. Nodule seen in the apex of the left lung on CT scan of the chest carried out without IV contrast on 11/16/2012.  REVIEW OF SYSTEMS:   Constitutional: Denies fevers, chills or abnormal weight loss Eyes: Denies blurriness of vision Ears, nose, mouth, throat, and face: Denies mucositis or sore throat Respiratory: Denies cough, dyspnea or wheezes Cardiovascular: Denies palpitation, chest discomfort or lower extremity swelling Gastrointestinal:  Denies nausea, heartburn or change in bowel habits Skin: Denies abnormal skin rashes Lymphatics: Denies new lymphadenopathy or easy bruising Neurological:Denies numbness, tingling or new weaknesses Behavioral/Psych: Mood is  stable, no new changes  All other systems were reviewed with the patient and are negative.  PHYSICAL EXAMINATION: ECOG PERFORMANCE STATUS: 1 - Symptomatic but completely  ambulatory  Blood pressure 195/98, pulse 106, temperature 97.1 F (36.2 C), temperature source Oral, resp. rate 17, height 5\' 5"  (1.651 m), weight 107 lb (48.535 kg).  GENERAL:alert, no distress and comfortable; very thin, frail  Appearing female.  SKIN: skin color, texture, turgor are normal, no rashes or significant lesions; very brittle appearing skin.  EYES: normal, Conjunctiva are pink and non-injected, sclera clear OROPHARYNX:no exudate, no erythema and lips, buccal mucosa, and tongue normal  NECK: supple, thyroid normal size, non-tender, without nodularity LYMPH:  no palpable lymphadenopathy in the cervical, axillary or supraclavicular LUNGS: clear to auscultation and percussion with normal breathing effort HEART: regular rate & rhythm and no murmurs and no lower extremity edema ABDOMEN:abdomen soft, non-tender and normal bowel sounds Musculoskeletal:no cyanosis of digits and no clubbing  NEURO: alert & oriented x 3 with fluent speech, no focal motor/sensory deficits; cautious gait.    LABORATORY DATA: Results for orders placed in visit on 08/06/13 (from the past 48 hour(s))  CBC WITH DIFFERENTIAL     Status: Abnormal   Collection Time    08/06/13 12:39 PM      Result Value Range   WBC 5.1  3.9 - 10.3 10e3/uL   NEUT# 2.8  1.5 - 6.5 10e3/uL   HGB 15.6  11.6 - 15.9 g/dL   HCT 16.1 (*) 09.6 - 04.5 %   Platelets 164  145 - 400 10e3/uL   MCV 102.3 (*) 79.5 - 101.0 fL   MCH 34.1 (*) 25.1 - 34.0 pg   MCHC 33.4  31.5 - 36.0 g/dL   RBC 4.09  8.11 - 9.14 10e6/uL   RDW 13.5  11.2 - 14.5 %   lymph# 1.4  0.9 - 3.3 10e3/uL   MONO# 0.8  0.1 - 0.9 10e3/uL   Eosinophils Absolute 0.0  0.0 - 0.5 10e3/uL   Basophils Absolute 0.0  0.0 - 0.1 10e3/uL   NEUT% 55.9  38.4 - 76.8 %   LYMPH% 27.4  14.0 - 49.7 %   MONO% 15.6 (*) 0.0 - 14.0 %   EOS% 0.6  0.0 - 7.0 %   BASO% 0.5  0.0 - 2.0 %  FERRITIN [facility]     Status: None   Collection Time    08/06/13 12:39 PM      Result Value Range    Ferritin 44  9 - 269 ng/ml  IRON AND TIBC [facility]     Status: None   Collection Time    08/06/13 12:39 PM      Result Value Range   Iron 142  41 - 142 ug/dL   TIBC 782  956 - 213 ug/dL   UIBC 086  578 - 469 ug/dL   %SAT 47  21 - 57 %  COMPREHENSIVE METABOLIC PANEL (CC13)     Status: Abnormal   Collection Time    08/06/13 12:39 PM      Result Value Range   Sodium 140  136 - 145 mEq/L   Potassium 4.1  3.5 - 5.1 mEq/L   Chloride 100  98 - 109 mEq/L   CO2 27  22 - 29 mEq/L   Glucose 127  70 - 140 mg/dl   BUN 8.4  7.0 - 62.9 mg/dL   Creatinine 0.8  0.6 - 1.1 mg/dL   Total Bilirubin 5.28  0.20 -  1.20 mg/dL   Alkaline Phosphatase 99  40 - 150 U/L   AST 42 (*) 5 - 34 U/L   ALT 16  0 - 55 U/L   Total Protein 8.3  6.4 - 8.3 g/dL   Albumin 4.2  3.5 - 5.0 g/dL   Calcium 56.4  8.4 - 33.2 mg/dL   Anion Gap 13 (*) 3 - 11 mEq/L  LACTATE DEHYDROGENASE (CC13)     Status: None   Collection Time    08/06/13 12:39 PM      Result Value Range   LDH 204  125 - 245 U/L   Labs:  Lab Results  Component Value Date   WBC 5.1 08/06/2013   HGB 15.6 08/06/2013   HCT 46.9* 08/06/2013   MCV 102.3* 08/06/2013   PLT 164 08/06/2013   NEUTROABS 2.8 08/06/2013      Chemistry      Component Value Date/Time   NA 140 08/06/2013 1239   NA 137 04/15/2012 1531   K 4.1 08/06/2013 1239   K 4.4 04/15/2012 1531   CL 103 02/05/2013 1136   CL 98 04/15/2012 1531   CO2 27 08/06/2013 1239   CO2 29 04/15/2012 1531   BUN 8.4 08/06/2013 1239   BUN 9 04/15/2012 1531   CREATININE 0.8 08/06/2013 1239   CREATININE 0.67 04/15/2012 1531      Component Value Date/Time   CALCIUM 10.2 08/06/2013 1239   CALCIUM 9.4 04/15/2012 1531   ALKPHOS 99 08/06/2013 1239   ALKPHOS 89 04/15/2012 1531   AST 42* 08/06/2013 1239   AST 61* 04/15/2012 1531   ALT 16 08/06/2013 1239   ALT 23 04/15/2012 1531   BILITOT 0.75 08/06/2013 1239   BILITOT 0.7 04/15/2012 1531     CBC:  Recent Labs Lab 08/06/13 1239  WBC 5.1  NEUTROABS 2.8  HGB 15.6  HCT 46.9*   MCV 102.3*  PLT 164   Studies:  No results found.  RADIOGRAPHIC STUDIES: 1. MRI of the abdomen with and without IV contrast on 06/05/2008 showed marked iron deposition within the liver with sparing of the spleen consistent with genetic hemochromatosis. There was atrophy  of the left lateral hepatic lobe with mild linear enhancement. This was favored to be most consistent with cirrhosis rather than hepatocellular carcinoma. There was mild iron deposition within the pancreas.  2. Complete abdominal ultrasound of the abdomen on 05/09/2009 showed no gallstones within the gallbladder and a normal common bile duct. There was heterogeneous echotexture of the liver, felt to be due to hepatitis or cirrhosis. There was no focal hepatic mass. No hydronephrosis or renal calculi.  3. Chest x-ray, 2 view, from 07/27/2009 showed mild cardiomegaly with no acute findings.  4. MRI of the abdomen with and without IV contrast on 04/28/2012 showed a decrease in the apparent degree of iron deposition within the nodular appearing liver compatible with improvement and secondary evidence of hemochromatosis. There were no enhancing lesions to suggest hepatocellular carcinoma. There was mild pancreatic iron deposition. There was evidence of left hepatic lobe atrophy with probable internal fibrosis/scarring. The study was compared with the prior MRI from 06/05/2008 and the previous ultrasound from 05/09/2009. Consideration of annual MRI surveillance was stated.  5. Chest x-ray, 2 view, from 11/05/2012 showed COPD. There was a possible sub-centimeter nodule in the left upper lobe. Chest CT scan was recommended. Chest x-ray was compared with the previous chest x-ray from 07/27/2009.  6. CT scan of the chest without IV contrast from 11/16/2012  showed a 10 x 7 mm left apical lesion suspicious for neoplasm. Scarring could not be excluded. There were underlying emphysematous changes without mediastinal or hilar lymphadenopathy. There  were moderate atherosclerotic calcifications involving the aorta and branch vessels.  7. CT scan of the chest without IV contrast on 02/01/2013 showed stable appearance of the 7 x 10-mm spiculated nodule in the anterior left upper lobe. A PET CT scan was suggested.   ASSESSMENT: Selinda Orion 79 y.o. female with a history of Hemochromatosis, hereditary  Hypertension - Plan: amLODipine (NORVASC) 2.5 MG tablet, metoprolol succinate (TOPROL-XL) 25 MG 24 hr tablet  Other hemochromatosis - Plan: CBC with Differential, Comprehensive metabolic panel, Iron and TIBC, Ferritin  Weight loss  Nodule of left lung  Tobacco abuse  PLAN:   1. Hemochromatosis.  --She continues to do well overall.   Her ferritin is 44 today.  She will not require a phlebotomy.   2. Hypertension. --Patient provided anti-hypertensive in clinic today.  She reports that she has been off them for some time.  She was given metoprolol 25 and amlodipine 2.5 mg daily.  She was counseled to avoid high salt diet.   3. Nodule of left lung. --Patient reports that she will schedule a follow-up for her Left lung nodule.  She requests if any scans are required that she have xanax prior as she finds claustrophobia to be very anxiety provoking.    4. Weight lost. --I counseled her to increase her ensure to 2-3 cans daily.   5. Tobacco Abuse. -- Patient was counseled to continue to decrease her smoking.   She did not elect for referral to tobacco cessation classes.   6.  Follow-up. --Patient will return to our office in 4 weeks for repeat labs including CBC, CMET and iron studies and ferritin.   All questions were answered. The patient knows to call the clinic with any problems, questions or concerns. We can certainly see the patient much sooner if necessary.  I spent 15 minutes counseling the patient face to face. The total time spent in the appointment was 25 minutes.    Breylon Sherrow, MD 08/06/2013 3:10 PM

## 2013-08-06 NOTE — Telephone Encounter (Signed)
gv and printed appt sched and avs for pt for Jan 2015  sed added tx. °

## 2013-08-06 NOTE — Patient Instructions (Signed)
Therapeutic Phlebotomy Therapeutic phlebotomy is the controlled removal of blood from your body for the purpose of treating a medical condition. It is similar to donating blood. Usually, about a pint (470 mL) of blood is removed. The average adult has 9 to 12 pints (4.3 to 5.7 L) of blood. Therapeutic phlebotomy may be used to treat the following medical conditions:  Hemochromatosis. This is a condition in which there is too much iron in the blood.  Polycythemia vera. This is a condition in which there are too many red cells in the blood.  Porphyria cutanea tarda. This is a disease usually passed from one generation to the next (inherited). It is a condition in which an important part of hemoglobin is not made properly. This results in the build up of abnormal amounts of porphyrins in the body.  Sickle cell disease. This is an inherited disease. It is a condition in which the red blood cells form an abnormal crescent shape rather than a round shape. LET YOUR CAREGIVER KNOW ABOUT:  Allergies.  Medicines taken including herbs, eyedrops, over-the-counter medicines, and creams.  Use of steroids (by mouth or creams).  Previous problems with anesthetics or numbing medicine.  History of blood clots.  History of bleeding or blood problems.  Previous surgery.  Possibility of pregnancy, if this applies. RISKS AND COMPLICATIONS This is a simple and safe procedure. Problems are unlikely. However, problems can occur and may include:  Nausea or lightheadedness.  Low blood pressure.  Soreness, bleeding, swelling, or bruising at the needle insertion site.  Infection. BEFORE THE PROCEDURE  This is a procedure that can be done as an outpatient. Confirm the time that you need to arrive for your procedure. Confirm whether there is a need to fast or withhold any medications. It is helpful to wear clothing with sleeves that can be raised above the elbow. A blood sample may be done to determine the  amount of red blood cells or iron in your blood. Plan ahead of time to have someone drive you home after the procedure. PROCEDURE The entire procedure from preparation through recovery takes about 1 hour. The actual collection takes about 10 to 15 minutes.  A needle will be inserted into your vein.  Tubing and a collection bag will be attached to that needle.  Blood will flow through the needle and tubing into the collection bag.  You may be asked to open and close your hand slowly and continuously during the entire collection.  Once the specified amount of blood has been removed from your body, the collection bag and tubing will be clamped.  The needle will be removed.  Pressure will be held on the site of the needle insertion to stop the bleeding. Then a bandage will be placed over the needle insertion site. AFTER THE PROCEDURE  Your recovery will be assessed and monitored. If there are no problems, as an outpatient, you should be able to go home shortly after the procedure.  Document Released: 01/21/2011 Document Revised: 11/11/2011 Document Reviewed: 01/21/2011 [employer] Patient Information 2014 [employer].  

## 2013-10-01 ENCOUNTER — Ambulatory Visit: Payer: Medicare Other

## 2013-10-01 ENCOUNTER — Other Ambulatory Visit (HOSPITAL_BASED_OUTPATIENT_CLINIC_OR_DEPARTMENT_OTHER): Payer: Medicare Other

## 2013-10-01 ENCOUNTER — Ambulatory Visit (HOSPITAL_BASED_OUTPATIENT_CLINIC_OR_DEPARTMENT_OTHER): Payer: Medicare Other | Admitting: Internal Medicine

## 2013-10-01 ENCOUNTER — Telehealth: Payer: Self-pay | Admitting: Internal Medicine

## 2013-10-01 DIAGNOSIS — F17200 Nicotine dependence, unspecified, uncomplicated: Secondary | ICD-10-CM

## 2013-10-01 DIAGNOSIS — I10 Essential (primary) hypertension: Secondary | ICD-10-CM

## 2013-10-01 DIAGNOSIS — Z720 Tobacco use: Secondary | ICD-10-CM

## 2013-10-01 DIAGNOSIS — R911 Solitary pulmonary nodule: Secondary | ICD-10-CM

## 2013-10-01 DIAGNOSIS — R634 Abnormal weight loss: Secondary | ICD-10-CM

## 2013-10-01 LAB — COMPREHENSIVE METABOLIC PANEL (CC13)
ALBUMIN: 4 g/dL (ref 3.5–5.0)
ALT: 14 U/L (ref 0–55)
ANION GAP: 12 meq/L — AB (ref 3–11)
AST: 28 U/L (ref 5–34)
Alkaline Phosphatase: 90 U/L (ref 40–150)
BUN: 11.8 mg/dL (ref 7.0–26.0)
CALCIUM: 9.6 mg/dL (ref 8.4–10.4)
CHLORIDE: 104 meq/L (ref 98–109)
CO2: 25 meq/L (ref 22–29)
CREATININE: 0.8 mg/dL (ref 0.6–1.1)
Glucose: 134 mg/dl (ref 70–140)
POTASSIUM: 4 meq/L (ref 3.5–5.1)
Sodium: 141 mEq/L (ref 136–145)
Total Bilirubin: 0.88 mg/dL (ref 0.20–1.20)
Total Protein: 7.4 g/dL (ref 6.4–8.3)

## 2013-10-01 LAB — CBC WITH DIFFERENTIAL/PLATELET
BASO%: 0.6 % (ref 0.0–2.0)
Basophils Absolute: 0 10*3/uL (ref 0.0–0.1)
EOS ABS: 0.1 10*3/uL (ref 0.0–0.5)
EOS%: 1.3 % (ref 0.0–7.0)
HCT: 42.9 % (ref 34.8–46.6)
HEMOGLOBIN: 14.3 g/dL (ref 11.6–15.9)
LYMPH#: 1.6 10*3/uL (ref 0.9–3.3)
LYMPH%: 27.5 % (ref 14.0–49.7)
MCH: 34 pg (ref 25.1–34.0)
MCHC: 33.4 g/dL (ref 31.5–36.0)
MCV: 101.9 fL — ABNORMAL HIGH (ref 79.5–101.0)
MONO#: 0.8 10*3/uL (ref 0.1–0.9)
MONO%: 13.9 % (ref 0.0–14.0)
NEUT#: 3.2 10*3/uL (ref 1.5–6.5)
NEUT%: 56.7 % (ref 38.4–76.8)
Platelets: 163 10*3/uL (ref 145–400)
RBC: 4.21 10*6/uL (ref 3.70–5.45)
RDW: 12.9 % (ref 11.2–14.5)
WBC: 5.7 10*3/uL (ref 3.9–10.3)

## 2013-10-01 LAB — IRON AND TIBC CHCC
%SAT: UNDETERMINED % (ref 21–?)
IRON: 269 ug/dL — AB (ref 41–142)
TIBC: 255 ug/dL (ref 236–444)
UIBC: UNDETERMINED ug/dL (ref 120–384)

## 2013-10-01 LAB — FERRITIN CHCC: Ferritin: 68 ng/ml (ref 9–269)

## 2013-10-01 MED ORDER — AMLODIPINE BESYLATE 2.5 MG PO TABS: 2.5000 mg | ORAL_TABLET | Freq: Every day | ORAL | Status: DC

## 2013-10-01 MED ORDER — METOPROLOL SUCCINATE ER 25 MG PO TB24: 25.0000 mg | ORAL_TABLET | Freq: Every day | ORAL | Status: DC

## 2013-10-01 NOTE — Telephone Encounter (Signed)
gv adn printed appt sched and avs for pt for Feb....sed added tx.

## 2013-10-01 NOTE — Progress Notes (Signed)
Per Dr. Juliann Mule, patient doesn't need a phlebotomy today.

## 2013-10-02 NOTE — Progress Notes (Signed)
[place] OFFICE PROGRESS NOTE  Aura Dials, PA-C [place] [id number]  DIAGNOSIS: Hemochromatosis, hereditary - Plan: amLODipine (NORVASC) 2.5 MG tablet, metoprolol succinate (TOPROL-XL) 25 MG 24 hr tablet, CBC with Differential, Comprehensive metabolic panel (Cmet) - [facility], Lactate dehydrogenase (LDH) - [facility], Ferritin  Nodule of left lung - Plan: amLODipine (NORVASC) 2.5 MG tablet, metoprolol succinate (TOPROL-XL) 25 MG 24 hr tablet, CT Chest Wo Contrast  Tobacco abuse - Plan: amLODipine (NORVASC) 2.5 MG tablet, metoprolol succinate (TOPROL-XL) 25 MG 24 hr tablet  Hypertension - Plan: amLODipine (NORVASC) 2.5 MG tablet, metoprolol succinate (TOPROL-XL) 25 MG 24 hr tablet  Chief Complaint  Patient presents with  . Hemochromatosis, hereditary   CURRENT THERAPY: Phlebotomy to maintain ferritin less than 50.   INTERVAL HISTORY: Brenda Gardner 80 y.o. female with a history of hypertension, hereditary hemochromatosis, homozygous for the C282Y gene since September 2009 who is here for follow-up.  She was last seen by me on 08/06/2013.     She was referred to Dr. Erasmo Leventhal secondary to nodule in the left apex that appeared to be malignant and had an SUV of 4.4 without additional metabolic activity.  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  This caused substantial delay for her follow with Dr. Roxan Hockey.  She also reports significant anxiety related to anticipated anesthesia.  She was instructed to follow up with Dr. Roxan Hockey but stated she did not because she was too anxious.  She has cut back significantly on her smoking.  She is now down to 1/3 pack per day.  She denies a colonoscopy or mammogram and states she has declined them for years.   She is taking ensure once a day for her weight lost. She reports gaining three pounds.   She denies any recent  hospitalizations or emergency room visits.    MEDICAL HISTORY: Past Medical History  Diagnosis Date  . Hypertension   . Hemochromatosis     INTERIM HISTORY: has Hemochromatosis, hereditary; Weight loss; Cough; Nodule of left lung; Hypertension; and Tobacco abuse on her problem list.    ALLERGIES:  is allergic to lisinopril.  MEDICATIONS: has a current medication list which includes the following prescription(s): amlodipine, metoprolol succinate, and multivitamin.  SURGICAL HISTORY:  Past Surgical History  Procedure Laterality Date  . Tubal ligation    . Appendectomy    . Tonsillectomy     PROBLEM LIST:  1. Hereditary hemochromatosis, homozygous for the C282Y gene with diagnosis established in September 2009 at which time, the ferritin level was 4484. The patient underwent a 1-unit phlebotomy on 05/18/2010 and on 12/07/2010. She underwent a 1/2-unit phlebotomy on 04/22/2012, 09/29/2012, 10/06/2012, and 10/27/2012. The patient now receives 200 mL of normal saline with each half-unit phlebotomy.  2. History of hypertension.  3. Abnormal MRI of the liver showing increased iron deposition within the liver as well as evidence of cirrhosis in October 2009. Hepatomegaly is present on physical exam.  4. Weight loss over 2013. The patient's baseline weight has been running 120 pounds. On 09/29/2012 weight was 106 pounds.  5. Cough started in December of 2013. The patient's cough has resolved.  6. Nodule seen in the apex of the left lung on CT scan of the chest carried out without IV contrast on 11/16/2012.  REVIEW OF SYSTEMS:   Constitutional: Denies fevers, chills or abnormal weight loss Eyes: Denies blurriness of vision  Ears, nose, mouth, throat, and face: Denies mucositis or sore throat Respiratory: Denies cough, dyspnea or wheezes Cardiovascular: Denies palpitation, chest discomfort or lower extremity swelling Gastrointestinal:  Denies nausea, heartburn or change in bowel habits Skin:  Denies abnormal skin rashes Lymphatics: Denies new lymphadenopathy or easy bruising Neurological:Denies numbness, tingling or new weaknesses Behavioral/Psych: Mood is stable, no new changes  All other systems were reviewed with the patient and are negative.  PHYSICAL EXAMINATION: ECOG PERFORMANCE STATUS: 1 - Symptomatic but completely ambulatory  Blood pressure 179/86, pulse 77, temperature 97.9 F (36.6 C), temperature source Oral, resp. rate 18, height 5\' 5"  (1.651 m), weight 110 lb 4.8 oz (50.032 kg).  GENERAL:alert, no distress and comfortable; very thin, frail  Appearing female.  SKIN: skin color, texture, turgor are normal, no rashes or significant lesions; very brittle appearing skin.  EYES: normal, Conjunctiva are pink and non-injected, sclera clear OROPHARYNX:no exudate, no erythema and lips, buccal mucosa, and tongue normal  NECK: supple, thyroid normal size, non-tender, without nodularity LYMPH:  no palpable lymphadenopathy in the cervical, axillary or supraclavicular LUNGS: clear to auscultation and percussion with normal breathing effort HEART: regular rate & rhythm and no murmurs and no lower extremity edema ABDOMEN:abdomen soft, non-tender and normal bowel sounds Musculoskeletal:no cyanosis of digits and no clubbing  NEURO: alert & oriented x 3 with fluent speech, no focal motor/sensory deficits; cautious gait.    LABORATORY DATA: Results for orders placed in visit on 10/01/13 (from the past 48 hour(s))  CBC WITH DIFFERENTIAL     Status: Abnormal   Collection Time    10/01/13 12:17 PM      Result Value Range   WBC 5.7  3.9 - 10.3 10e3/uL   NEUT# 3.2  1.5 - 6.5 10e3/uL   HGB 14.3  11.6 - 15.9 g/dL   HCT 42.9  34.8 - 46.6 %   Platelets 163  145 - 400 10e3/uL   MCV 101.9 (*) 79.5 - 101.0 fL   MCH 34.0  25.1 - 34.0 pg   MCHC 33.4  31.5 - 36.0 g/dL   RBC 4.21  3.70 - 5.45 10e6/uL   RDW 12.9  11.2 - 14.5 %   lymph# 1.6  0.9 - 3.3 10e3/uL   MONO# 0.8  0.1 - 0.9  10e3/uL   Eosinophils Absolute 0.1  0.0 - 0.5 10e3/uL   Basophils Absolute 0.0  0.0 - 0.1 10e3/uL   NEUT% 56.7  38.4 - 76.8 %   LYMPH% 27.5  14.0 - 49.7 %   MONO% 13.9  0.0 - 14.0 %   EOS% 1.3  0.0 - 7.0 %   BASO% 0.6  0.0 - 2.0 %  COMPREHENSIVE METABOLIC PANEL (KZ99)     Status: Abnormal   Collection Time    10/01/13 12:17 PM      Result Value Range   Sodium 141  136 - 145 mEq/L   Potassium 4.0  3.5 - 5.1 mEq/L   Chloride 104  98 - 109 mEq/L   CO2 25  22 - 29 mEq/L   Glucose 134  70 - 140 mg/dl   BUN 11.8  7.0 - 26.0 mg/dL   Creatinine 0.8  0.6 - 1.1 mg/dL   Total Bilirubin 0.88  0.20 - 1.20 mg/dL   Alkaline Phosphatase 90  40 - 150 U/L   AST 28  5 - 34 U/L   ALT 14  0 - 55 U/L   Total Protein 7.4  6.4 - 8.3 g/dL  Albumin 4.0  3.5 - 5.0 g/dL   Calcium 9.6  8.4 - 10.4 mg/dL   Anion Gap 12 (*) 3 - 11 mEq/L  IRON AND TIBC [facility]     Status: Abnormal   Collection Time    10/01/13 12:17 PM      Result Value Range   Iron 269 (*) 41 - 142 ug/dL   TIBC 255  236 - 444 ug/dL   UIBC Unable to calculate.  120 - 384 ug/dL   %SAT Unable to determine.  21 - 57% %  FERRITIN [facility]     Status: None   Collection Time    10/01/13 12:17 PM      Result Value Range   Ferritin 68  9 - 269 ng/ml   Labs:  Lab Results  Component Value Date   WBC 5.7 10/01/2013   HGB 14.3 10/01/2013   HCT 42.9 10/01/2013   MCV 101.9* 10/01/2013   PLT 163 10/01/2013   NEUTROABS 3.2 10/01/2013      Chemistry      Component Value Date/Time   NA 141 10/01/2013 1217   NA 137 04/15/2012 1531   K 4.0 10/01/2013 1217   K 4.4 04/15/2012 1531   CL 103 02/05/2013 1136   CL 98 04/15/2012 1531   CO2 25 10/01/2013 1217   CO2 29 04/15/2012 1531   BUN 11.8 10/01/2013 1217   BUN 9 04/15/2012 1531   CREATININE 0.8 10/01/2013 1217   CREATININE 0.67 04/15/2012 1531      Component Value Date/Time   CALCIUM 9.6 10/01/2013 1217   CALCIUM 9.4 04/15/2012 1531   ALKPHOS 90 10/01/2013 1217   ALKPHOS 89 04/15/2012 1531   AST 28 10/01/2013  1217   AST 61* 04/15/2012 1531   ALT 14 10/01/2013 1217   ALT 23 04/15/2012 1531   BILITOT 0.88 10/01/2013 1217   BILITOT 0.7 04/15/2012 1531     CBC:  Recent Labs Lab 10/01/13 1217  WBC 5.7  NEUTROABS 3.2  HGB 14.3  HCT 42.9  MCV 101.9*  PLT 163   Studies:  No results found.  RADIOGRAPHIC STUDIES: 1. MRI of the abdomen with and without IV contrast on 06/05/2008 showed marked iron deposition within the liver with sparing of the spleen consistent with genetic hemochromatosis. There was atrophy of the left lateral hepatic lobe with mild linear enhancement. This was favored to be most consistent with cirrhosis rather than hepatocellular carcinoma. There was mild iron deposition within the pancreas.  2. Complete abdominal ultrasound of the abdomen on 05/09/2009 showed no gallstones within the gallbladder and a normal common bile duct. There was heterogeneous echotexture of the liver, felt to be due to hepatitis or cirrhosis. There was no focal hepatic mass. No hydronephrosis or renal calculi.  3. Chest x-ray, 2 view, from 07/27/2009 showed mild cardiomegaly with no acute findings.  4. MRI of the abdomen with and without IV contrast on 04/28/2012 showed a decrease in the apparent degree of iron deposition within the nodular appearing liver compatible with improvement and secondary evidence of hemochromatosis. There were no enhancing lesions to suggest hepatocellular carcinoma. There was mild pancreatic iron deposition. There was evidence of left hepatic lobe atrophy with probable internal fibrosis/scarring. The study was compared with the prior MRI from 06/05/2008 and the previous ultrasound from 05/09/2009. Consideration of annual MRI surveillance was stated.  5. Chest x-ray, 2 view, from 11/05/2012 showed COPD. There was a possible sub-centimeter nodule in the left upper lobe. Chest CT scan  was recommended. Chest x-ray was compared with the previous chest x-ray from 07/27/2009.  6. CT scan of  the chest without IV contrast from 11/16/2012 showed a 10 x 7 mm left apical lesion suspicious for neoplasm. Scarring could not be excluded. There were underlying emphysematous changes without mediastinal or hilar lymphadenopathy. There were moderate atherosclerotic calcifications involving the aorta and branch vessels.  7. CT scan of the chest without IV contrast on 02/01/2013 showed stable appearance of the 7 x 10-mm spiculated nodule in the anterior left upper lobe. A PET CT scan was suggested.   ASSESSMENT: Brenda Gardner 80 y.o. female with a history of Hemochromatosis, hereditary - Plan: amLODipine (NORVASC) 2.5 MG tablet, metoprolol succinate (TOPROL-XL) 25 MG 24 hr tablet, CBC with Differential, Comprehensive metabolic panel (Cmet) - [facility], Lactate dehydrogenase (LDH) - [facility], Ferritin  Nodule of left lung - Plan: amLODipine (NORVASC) 2.5 MG tablet, metoprolol succinate (TOPROL-XL) 25 MG 24 hr tablet, CT Chest Wo Contrast  Tobacco abuse - Plan: amLODipine (NORVASC) 2.5 MG tablet, metoprolol succinate (TOPROL-XL) 25 MG 24 hr tablet  Hypertension - Plan: amLODipine (NORVASC) 2.5 MG tablet, metoprolol succinate (TOPROL-XL) 25 MG 24 hr tablet  PLAN:   1. Hemochromatosis.  --She continues to do well overall.   Her ferritin is 68 today.  She will require a phlebotomy early next week.   2. Hypertension. --Patient provided anti-hypertensives in clinic today.   She was given metoprolol 25 and amlodipine 2.5 mg daily.  She was counseled to avoid high salt diet.   3. Nodule of left lung. --Patient reported that she will schedule a follow-up for her Left lung nodule previously with Dr. Roxan Hockey.  We will repeat CT of chest without contrast to assess for growth.   We will discuss the results and one month.  Patient states she wants to follow with imaging if the size has not changed.   If it has increased in size, we suggested that she see Dr. Roxan Hockey as soon as possible.   4. Weight  lost. --I counseled her to increase her ensure to 2-3 cans daily. She has gained 2-3 lbs.   5. Tobacco Abuse. -- Patient was counseled to continue to decrease her smoking.   She did not elect for referral to tobacco cessation classes.   6.  Follow-up. --Patient will return to our office in 4 weeks for repeat labs including CBC, CMET, CT of chest without contrast, and iron studies and ferritin.   All questions were answered. The patient knows to call the clinic with any problems, questions or concerns. We can certainly see the patient much sooner if necessary.  I spent 15 minutes counseling the patient face to face. The total time spent in the appointment was 25 minutes.    Emeterio Balke, MD 10/02/2013 10:15 AM

## 2013-10-04 ENCOUNTER — Telehealth: Payer: Self-pay | Admitting: *Deleted

## 2013-10-04 ENCOUNTER — Telehealth: Payer: Self-pay | Admitting: Internal Medicine

## 2013-10-04 NOTE — Telephone Encounter (Signed)
Per staff message and POF I have scheduled appts.  JMW  

## 2013-10-04 NOTE — Telephone Encounter (Signed)
, °

## 2013-10-08 ENCOUNTER — Ambulatory Visit (HOSPITAL_COMMUNITY)
Admission: RE | Admit: 2013-10-08 | Discharge: 2013-10-08 | Disposition: A | Discharge status: Home / Self Care | Payer: Medicare Other | Source: Ambulatory Visit | Attending: Internal Medicine | Admitting: Internal Medicine

## 2013-10-08 ENCOUNTER — Other Ambulatory Visit (HOSPITAL_BASED_OUTPATIENT_CLINIC_OR_DEPARTMENT_OTHER): Payer: Medicare Other

## 2013-10-08 DIAGNOSIS — R911 Solitary pulmonary nodule: Secondary | ICD-10-CM | POA: Insufficient documentation

## 2013-10-08 DIAGNOSIS — I2510 Atherosclerotic heart disease of native coronary artery without angina pectoris: Secondary | ICD-10-CM | POA: Insufficient documentation

## 2013-10-08 DIAGNOSIS — I700 Atherosclerosis of aorta: Secondary | ICD-10-CM | POA: Insufficient documentation

## 2013-10-08 LAB — COMPREHENSIVE METABOLIC PANEL (CC13)
ALK PHOS: 88 U/L (ref 40–150)
ALT: 13 U/L (ref 0–55)
AST: 30 U/L (ref 5–34)
Albumin: 4 g/dL (ref 3.5–5.0)
Anion Gap: 12 mEq/L — ABNORMAL HIGH (ref 3–11)
BILIRUBIN TOTAL: 0.59 mg/dL (ref 0.20–1.20)
BUN: 13.9 mg/dL (ref 7.0–26.0)
CALCIUM: 10 mg/dL (ref 8.4–10.4)
CHLORIDE: 103 meq/L (ref 98–109)
CO2: 25 mEq/L (ref 22–29)
CREATININE: 0.8 mg/dL (ref 0.6–1.1)
Glucose: 122 mg/dl (ref 70–140)
Potassium: 4.2 mEq/L (ref 3.5–5.1)
Sodium: 140 mEq/L (ref 136–145)
Total Protein: 7.5 g/dL (ref 6.4–8.3)

## 2013-10-08 LAB — CBC WITH DIFFERENTIAL/PLATELET
BASO%: 0.5 % (ref 0.0–2.0)
BASOS ABS: 0 10*3/uL (ref 0.0–0.1)
EOS%: 1.4 % (ref 0.0–7.0)
Eosinophils Absolute: 0.1 10*3/uL (ref 0.0–0.5)
HCT: 43 % (ref 34.8–46.6)
HGB: 14.3 g/dL (ref 11.6–15.9)
LYMPH#: 1.6 10*3/uL (ref 0.9–3.3)
LYMPH%: 29.4 % (ref 14.0–49.7)
MCH: 33.8 pg (ref 25.1–34.0)
MCHC: 33.3 g/dL (ref 31.5–36.0)
MCV: 101.7 fL — ABNORMAL HIGH (ref 79.5–101.0)
MONO#: 0.9 10*3/uL (ref 0.1–0.9)
MONO%: 16 % — AB (ref 0.0–14.0)
NEUT#: 2.8 10*3/uL (ref 1.5–6.5)
NEUT%: 52.7 % (ref 38.4–76.8)
Platelets: 160 10*3/uL (ref 145–400)
RBC: 4.22 10*6/uL (ref 3.70–5.45)
RDW: 13.2 % (ref 11.2–14.5)
WBC: 5.4 10*3/uL (ref 3.9–10.3)

## 2013-10-08 LAB — FERRITIN CHCC: FERRITIN: 65 ng/mL (ref 9–269)

## 2013-10-08 LAB — LACTATE DEHYDROGENASE (CC13): LDH: 171 U/L (ref 125–245)

## 2013-10-11 ENCOUNTER — Other Ambulatory Visit: Payer: Self-pay | Admitting: Internal Medicine

## 2013-10-11 DIAGNOSIS — R911 Solitary pulmonary nodule: Secondary | ICD-10-CM

## 2013-10-13 ENCOUNTER — Telehealth: Payer: Self-pay | Admitting: Internal Medicine

## 2013-10-13 ENCOUNTER — Other Ambulatory Visit: Payer: Self-pay

## 2013-10-13 NOTE — Telephone Encounter (Signed)
Talked to pt and she is aware of lab appt before md tomorrow

## 2013-10-14 ENCOUNTER — Other Ambulatory Visit: Payer: Self-pay | Admitting: Internal Medicine

## 2013-10-14 ENCOUNTER — Telehealth: Payer: Self-pay | Admitting: Internal Medicine

## 2013-10-14 ENCOUNTER — Ambulatory Visit (HOSPITAL_BASED_OUTPATIENT_CLINIC_OR_DEPARTMENT_OTHER): Payer: Medicare Other | Admitting: Internal Medicine

## 2013-10-14 ENCOUNTER — Ambulatory Visit (HOSPITAL_BASED_OUTPATIENT_CLINIC_OR_DEPARTMENT_OTHER): Payer: Medicare Other

## 2013-10-14 ENCOUNTER — Other Ambulatory Visit (HOSPITAL_BASED_OUTPATIENT_CLINIC_OR_DEPARTMENT_OTHER): Payer: Medicare Other

## 2013-10-14 DIAGNOSIS — F17200 Nicotine dependence, unspecified, uncomplicated: Secondary | ICD-10-CM

## 2013-10-14 DIAGNOSIS — R911 Solitary pulmonary nodule: Secondary | ICD-10-CM

## 2013-10-14 DIAGNOSIS — I10 Essential (primary) hypertension: Secondary | ICD-10-CM

## 2013-10-14 DIAGNOSIS — R634 Abnormal weight loss: Secondary | ICD-10-CM

## 2013-10-14 DIAGNOSIS — Z720 Tobacco use: Secondary | ICD-10-CM

## 2013-10-14 MED ORDER — SODIUM CHLORIDE 0.9 % IV SOLN: INTRAVENOUS | Status: AC

## 2013-10-14 NOTE — Telephone Encounter (Signed)
Appointment

## 2013-10-14 NOTE — Telephone Encounter (Signed)
Gave pt appt for lab and Md for April , talked to Gargatha from [place] she will call me with appt

## 2013-10-15 NOTE — Progress Notes (Signed)
[place] OFFICE PROGRESS NOTE  Aura Dials, PA-C [place] [id number]  DIAGNOSIS: Hemochromatosis, hereditary - Plan: CBC with Differential, Ferritin, Comprehensive metabolic panel (Cmet) - [facility]  Tobacco abuse - Plan: 0.9 %  sodium chloride infusion  Weight loss  Nodule of left lung - Plan: Ambulatory referral to Cardiothoracic Surgery  Chief Complaint  Patient presents with  . Hemochromatosis, hereditary   CURRENT THERAPY: Phlebotomy to maintain ferritin less than 50.   INTERVAL HISTORY: Brenda Gardner 80 y.o. female with a history of hypertension, hereditary hemochromatosis, homozygous for the C282Y gene since September 2009 who is here for follow-up.  She was last seen by me on 08/06/2013.     Previously, she was referred to Dr. Erasmo Leventhal secondary to nodule in the left apex that appeared to be malignant and had an SUV of 4.4 without additional metabolic activity.  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  This caused substantial delay for her follow with Dr. Roxan Hockey.  She also reports significant anxiety related to anticipated anesthesia.  She was instructed to follow up with Dr. Roxan Hockey but stated she did not because she was too anxious. In the interim, we repeated her CT of Chest which demonstrated in increase in the size of the pulmonary nodule.   She has cut back significantly on her smoking.  She is now down to 1/3 pack per day.  She denies a colonoscopy or mammogram and states she has declined them for years.   She is taking ensure once a day for her weight lost and again reports gaining three pounds.   She denies any recent hospitalizations or emergency room visits.    MEDICAL HISTORY: Past Medical History  Diagnosis Date  . Hypertension   . Hemochromatosis     INTERIM HISTORY: has Hemochromatosis, hereditary; Weight loss; Cough;  Nodule of left lung; Hypertension; and Tobacco abuse on her problem list.    ALLERGIES:  is allergic to lisinopril.  MEDICATIONS: has a current medication list which includes the following prescription(s): amlodipine, metoprolol succinate, and multivitamin.  SURGICAL HISTORY:  Past Surgical History  Procedure Laterality Date  . Tubal ligation    . Appendectomy    . Tonsillectomy     PROBLEM LIST:  1. Hereditary hemochromatosis, homozygous for the C282Y gene with diagnosis established in September 2009 at which time, the ferritin level was 4484. The patient underwent a 1-unit phlebotomy on 05/18/2010 and on 12/07/2010. She underwent a 1/2-unit phlebotomy on 04/22/2012, 09/29/2012, 10/06/2012, and 10/27/2012. The patient now receives 200 mL of normal saline with each half-unit phlebotomy.  2. History of hypertension.  3. Abnormal MRI of the liver showing increased iron deposition within the liver as well as evidence of cirrhosis in October 2009. Hepatomegaly is present on physical exam.  4. Weight loss over 2013. The patient's baseline weight has been running 120 pounds. On 09/29/2012 weight was 106 pounds.  5. Cough started in December of 2013. The patient's cough has resolved.  6. Nodule seen in the apex of the left lung on CT scan of the chest carried out without IV contrast on 11/16/2012.  REVIEW OF SYSTEMS:   Constitutional: Denies fevers, chills or abnormal weight loss Eyes: Denies blurriness of vision Ears, nose, mouth, throat, and face: Denies mucositis or sore throat Respiratory: Denies cough, dyspnea or wheezes Cardiovascular: Denies palpitation, chest discomfort or lower extremity swelling Gastrointestinal:  Denies nausea, heartburn or change in bowel habits Skin: Denies abnormal skin rashes Lymphatics: Denies new lymphadenopathy or easy bruising Neurological:Denies numbness, tingling or new weaknesses Behavioral/Psych: Mood is stable, no new changes  All other systems were  reviewed with the patient and are negative.  PHYSICAL EXAMINATION: ECOG PERFORMANCE STATUS: 1 - Symptomatic but completely ambulatory  Blood pressure 181/83, pulse 76, temperature 98.1 F (36.7 C), temperature source Oral, resp. rate 18, height 5\' 5"  (1.651 m), weight 111 lb 11.2 oz (50.667 kg).  GENERAL:alert, no distress and comfortable; very thin, frail appearing female.  SKIN: skin color, texture, turgor are normal, no rashes or significant lesions; very brittle appearing skin.  EYES: normal, Conjunctiva are pink and non-injected, sclera clear OROPHARYNX:no exudate, no erythema and lips, buccal mucosa, and tongue normal  NECK: supple, thyroid normal size, non-tender, without nodularity LYMPH:  no palpable lymphadenopathy in the cervical, axillary or supraclavicular LUNGS: clear to auscultation with normal breathing effort with a prolonged expiratory phase HEART: regular rate & rhythm and no murmurs and no lower extremity edema ABDOMEN:abdomen soft, non-tender and normal bowel sounds Musculoskeletal:no cyanosis of digits and no clubbing  NEURO: alert & oriented x 3 with fluent speech, no focal motor/sensory deficits; cautious gait.    Labs:  Lab Results  Component Value Date   WBC 5.4 10/08/2013   HGB 14.3 10/08/2013   HCT 43.0 10/08/2013   MCV 101.7* 10/08/2013   PLT 160 10/08/2013   NEUTROABS 2.8 10/08/2013      Chemistry      Component Value Date/Time   NA 140 10/08/2013 1303   NA 137 04/15/2012 1531   K 4.2 10/08/2013 1303   K 4.4 04/15/2012 1531   CL 103 02/05/2013 1136   CL 98 04/15/2012 1531   CO2 25 10/08/2013 1303   CO2 29 04/15/2012 1531   BUN 13.9 10/08/2013 1303   BUN 9 04/15/2012 1531   CREATININE 0.8 10/08/2013 1303   CREATININE 0.67 04/15/2012 1531      Component Value Date/Time   CALCIUM 10.0 10/08/2013 1303   CALCIUM 9.4 04/15/2012 1531   ALKPHOS 88 10/08/2013 1303   ALKPHOS 89 04/15/2012 1531   AST 30 10/08/2013 1303   AST 61* 04/15/2012 1531   ALT 13 10/08/2013 1303   ALT 23  04/15/2012 1531   BILITOT 0.59 10/08/2013 1303   BILITOT 0.7 04/15/2012 1531     Iron/TIBC/Ferritin    Component Value Date/Time   IRON 269* 10/01/2013 1217   IRON 95 02/05/2013 1136   TIBC 255 10/01/2013 1217   TIBC 293 02/05/2013 1136   FERRITIN 65 10/08/2013 1302   FERRITIN 33 12/15/2012 1004   CBC: No results found for this basename: WBC, NEUTROABS, HGB, HCT, MCV, PLT,  in the last 168 hours Studies:  No results found.  RADIOGRAPHIC STUDIES: 1. MRI of the abdomen with and without IV contrast on 06/05/2008 showed marked iron deposition within the liver with sparing of the spleen consistent with genetic hemochromatosis. There was atrophy of the left lateral hepatic lobe with mild linear enhancement. This was favored to be most consistent with cirrhosis rather than hepatocellular carcinoma. There was mild iron deposition within the pancreas.  2. Complete abdominal ultrasound of the abdomen on 05/09/2009 showed no gallstones within the gallbladder and a normal common bile duct. There was heterogeneous echotexture of the liver, felt to be due to hepatitis or cirrhosis. There was no focal hepatic mass. No hydronephrosis or renal calculi.  3. Chest x-ray, 2 view,  from 07/27/2009 showed mild cardiomegaly with no acute findings.  4. MRI of the abdomen with and without IV contrast on 04/28/2012 showed a decrease in the apparent degree of iron deposition within the nodular appearing liver compatible with improvement and secondary evidence of hemochromatosis. There were no enhancing lesions to suggest hepatocellular carcinoma. There was mild pancreatic iron deposition. There was evidence of left hepatic lobe atrophy with probable internal fibrosis/scarring. The study was compared with the prior MRI from 06/05/2008 and the previous ultrasound from 05/09/2009. Consideration of annual MRI surveillance was stated.  5. Chest x-ray, 2 view, from 11/05/2012 showed COPD. There was a possible sub-centimeter nodule in the  left upper lobe. Chest CT scan was recommended. Chest x-ray was compared with the previous chest x-ray from 07/27/2009.  6. CT scan of the chest without IV contrast from 11/16/2012 showed a 10 x 7 mm left apical lesion suspicious for neoplasm. Scarring could not be excluded. There were underlying emphysematous changes without mediastinal or hilar lymphadenopathy. There were moderate atherosclerotic calcifications involving the aorta and branch vessels.  7. CT scan of the chest without IV contrast on 02/01/2013 showed stable appearance of the 7 x 10-mm spiculated nodule in the anterior left upper lobe. A PET CT scan was suggested.  8. CT scan of the chest without IV contrast on 10/08/2013 showed further increase in size of left apical nodule which is concerning for primary neoplasm of the lung and a calcified atherosclerotic disease including multi vessel coronary artery calcifications.  ASSESSMENT: Brenda Gardner 80 y.o. female with a history of Hemochromatosis, hereditary - Plan: CBC with Differential, Ferritin, Comprehensive metabolic panel (Cmet) - [facility]  Tobacco abuse - Plan: 0.9 %  sodium chloride infusion  Weight loss  Nodule of left lung - Plan: Ambulatory referral to Cardiothoracic Surgery  PLAN:   1. Hemochromatosis.  --She continues to do well overall.   Her ferritin is 65 today.  She will require a phlebotomy in the next day or well.   .   2. Hypertension. --She was counseled to avoid high salt diet. She reports that her blood pressures are better controlled at home.   3. Nodule of left lung concerning for primary neoplasm of the lung. --Patient reported that she will schedule a follow-up for her Left lung nodule previously with Dr. Roxan Hockey.  Repeat CT of chest without contrast to assess for growth showed further increase in size of left apical nodule which is concerning for primary neoplasm of the lung.  A referral was made to Cardio-thoracic surgery.   4. Weight lost. --I  counseled her to increase her ensure to 2-3 cans daily. She has gained 2-3 lbs.   5. Tobacco Abuse. -- Patient was counseled to continue to decrease her smoking.   She did not elect for referral to tobacco cessation classes.   6.  Follow-up. --Patient will return to our office in 8 weeks for repeat labs including CBC, CMET, iron studies and ferritin.   All questions were answered. The patient knows to call the clinic with any problems, questions or concerns. We can certainly see the patient much sooner if necessary.  I spent 15 minutes counseling the patient face to face. The total time spent in the appointment was 25 minutes.    Chuong Casebeer, MD 10/15/2013 2:29 PM

## 2013-10-19 ENCOUNTER — Encounter: Payer: Medicare Other | Admitting: Thoracic Surgery (Cardiothoracic Vascular Surgery)

## 2013-11-02 ENCOUNTER — Encounter: Payer: Self-pay | Admitting: Thoracic Surgery (Cardiothoracic Vascular Surgery)

## 2013-11-02 ENCOUNTER — Other Ambulatory Visit: Payer: Self-pay | Admitting: *Deleted

## 2013-11-02 ENCOUNTER — Institutional Professional Consult (permissible substitution) (INDEPENDENT_AMBULATORY_CARE_PROVIDER_SITE_OTHER): Payer: Medicare Other | Admitting: Thoracic Surgery (Cardiothoracic Vascular Surgery)

## 2013-11-02 VITALS — BP 180/90 | HR 78 | Resp 20 | Ht 65.0 in | Wt 111.0 lb

## 2013-11-02 DIAGNOSIS — R911 Solitary pulmonary nodule: Secondary | ICD-10-CM

## 2013-11-02 DIAGNOSIS — R918 Other nonspecific abnormal finding of lung field: Secondary | ICD-10-CM

## 2013-11-02 NOTE — Progress Notes (Signed)
HPI:  80 year old woman sent for re-consultation regarding a left upper lobe mass.   Brenda Gardner is a 80 year old woman with a history of tobacco abuse who was found earlier this year to have a left upper lobe nodule. She was followed by Dr. Ralene Ok for a hemochromatosis. She saw him earlier this year and he recommended a chest x-ray should she had not had one in while. That showed a questionable left upper lobe nodule and led to a CT scan, which confirmed a spiculated 9.9 mm left upper lobe mass. A repeat CT was done in June and showed the mass was still present. That led to a PET/CT which showed the lesion to be hypermetabolic with an SUV of 4.4.   I saw her last summer and recommended surgical resection. She was at first reluctant due to her fear of having anesthesia, but after multiple discussions agreed to have surgery. She says that her mother then took a turn for the worse and she cancelled her surgery. She never had her PFTs done.  She has a complex smoking history. She started when she was 18 at first she would only smoke occasionally. She says she began smoking heavily at age 58, up to 2 packs per day. She currently has cut back and is now smoking about one half of a pack per day. She has been at that level for about a year.  She is not very physically active. She does get short of breath with walking up one flight of stairs. She seldom walks up any incline. She denies any chest pain, pressure, or tightness. She does have hypertension but no other significant cardiovascular history. She denies frequent cough, hemoptysis, wheezing. She lost a lot of weight while caring for her mother, but says she has gained back 3 pounds over the past 3 months.  Dr. Ralene Ok had been her Oncolgist for years, but recently retired. She recently saw Dr. Juliann Mule who is taking over her care. He repeated the chest CT and the LUL nodule had grown. She was referred back to discuss possible surgical resection.  Past  Medical History  Diagnosis Date  . Hypertension   . Hemochromatosis    Past Surgical History  Procedure Laterality Date  . Tubal ligation    . Appendectomy    . Tonsillectomy     Social history Lives alone, retired, smoking history see history of present illness History   Social History  . Marital Status: Divorced    Spouse Name: N/A    Number of Children: N/A  . Years of Education: N/A   Occupational History  . Not on file.   Social History Main Topics  . Smoking status: Current Every Day Smoker -- 0.33 packs/day for 50 years    Types: Cigarettes  . Smokeless tobacco: Never Used  . Alcohol Use: Yes  . Drug Use: Not on file  . Sexual Activity: Not on file   Other Topics Concern  . Not on file   Social History Narrative  . No narrative on file   Family History   Mother died at age 67 from heart disease Father had cancer  Brother died from cancer     Current Outpatient Prescriptions  Medication Sig Dispense Refill  . amLODipine (NORVASC) 2.5 MG tablet Take 1 tablet (2.5 mg total) by mouth daily.  30 tablet  1  . metoprolol succinate (TOPROL-XL) 25 MG 24 hr tablet Take 1 tablet (25 mg total) by mouth daily.  30 tablet  1  .  Multiple Vitamin (MULTIVITAMIN) tablet Take 1 tablet by mouth daily.       No current facility-administered medications for this visit.   Review of Systems  See history of present illness Bruises easily, hiatal hernia, pain in right hand, legs weak bilaterally, tremor, all other systems are negative  Physical Exam BP 180/90  Pulse 78  Resp 20  Ht 5\' 5"  (1.651 m)  Wt 111 lb (50.349 kg)  BMI 18.47 kg/m2  SpO2 97% 80 yo woman in NAD Thin, frail, resting tremor  Neurologic and oriented x3, tremor, no focal motor deficit HEENT unremarkable Neck patient to sensitive to examine Cardiac regular rate and rhythm normal S1 and S2 Lungs clear with equal breath sounds bilaterally Abdomen soft nontender Extremities without clubbing  cyanosis or edema Skin atrophic   Diagnostic Tests: CT chest 10/08/2013 CT CHEST WITHOUT CONTRAST  TECHNIQUE:  Multidetector CT imaging of the chest was performed following the  standard protocol without IV contrast.  COMPARISON: 02/01/2013  FINDINGS:  No pleural effusion identified. There is a left apical subpleural  nodule which now measures 1.9 cm. Previously this measured 1 cm. No  new pulmonary nodule or mass identified.  Heart size is normal. There is calcified atherosclerotic disease  involving the thoracic aorta and its branches. There also  calcifications involving the RCA, LAD and left circumflex coronary  arteries. No mediastinal or hilar adenopathy identified.  No axillary or supraclavicular adenopathy.  Incidental imaging through the upper abdomen shows no acute  findings.  Review of the visualized osseous structures is significant for mild  spondylosis. No aggressive lytic or sclerotic bone lesions  identified.  IMPRESSION:  1. Further increase in size of left apical nodule which is  concerning for primary neoplasm of the lung.  2. Calcified atherosclerotic disease including multi vessel coronary  artery calcifications.  Electronically Signed  By: Kerby Moors M.D.  On: 10/08/2013 15:55   Impression: 80 year old woman with a long history of tobacco abuse who has a 2 cm spiculated mass in the anterior, superior left upper lobe. This is adjacent to the left subclavian artery. This lesion has essentially doubled in size over the past year and also was hypermetabolic by PET CT last year. It almost certainly is a primary bronchogenic carcinoma. He would have to be considered cancer unless it could be proven otherwise.  My recommendation to Brenda Gardner was that we do a left video-assisted thoracoscopic resection. In her case we could likely do a lingular sparing left upper lobectomy, which should have a relatively small in fact on her pulmonary function. She says that she  will not have surgery. She has a severe fear of undergoing general anesthesia. In addition she says that she would not be willing to stay in the hospital for 4 days afterwards. She wishes to know what other treatment modality would be available.  This lesion may be amenable to radiation therapy. Although it's close proximity to the left subclavian artery and mediastinum may be a limiting factor. A radiation oncologist would have to determine how much of an issue that might be.  After discussion, she is still refusing surgery. I offered to arrange for an anesthesia consultation so that she can talk to an anesthesiologist before making a final decision. She refused that offer. She wishes to proceed with a biopsy and then consultation with a radiation oncologist about possible treatment   Plan: CT-guided needle biopsy left upper lobe mass  Referral to radiation oncology for possible SBRT

## 2013-11-12 ENCOUNTER — Encounter (HOSPITAL_COMMUNITY): Payer: Self-pay | Admitting: Pharmacy Technician

## 2013-11-12 ENCOUNTER — Other Ambulatory Visit: Payer: Self-pay | Admitting: Radiology

## 2013-11-16 ENCOUNTER — Other Ambulatory Visit: Payer: Self-pay | Admitting: Radiology

## 2013-11-18 ENCOUNTER — Ambulatory Visit (HOSPITAL_COMMUNITY)
Admission: RE | Admit: 2013-11-18 | Discharge: 2013-11-18 | Disposition: A | Discharge status: Home / Self Care | Payer: Medicare Other | Source: Ambulatory Visit | Attending: Thoracic Surgery (Cardiothoracic Vascular Surgery) | Admitting: Thoracic Surgery (Cardiothoracic Vascular Surgery)

## 2013-11-18 DIAGNOSIS — J984 Other disorders of lung: Secondary | ICD-10-CM | POA: Insufficient documentation

## 2013-11-18 DIAGNOSIS — R918 Other nonspecific abnormal finding of lung field: Secondary | ICD-10-CM

## 2013-11-18 LAB — APTT: aPTT: 30 seconds (ref 24–37)

## 2013-11-18 LAB — CBC
HEMATOCRIT: 41.4 % (ref 36.0–46.0)
Hemoglobin: 14.6 g/dL (ref 12.0–15.0)
MCH: 34.4 pg — ABNORMAL HIGH (ref 26.0–34.0)
MCHC: 35.3 g/dL (ref 30.0–36.0)
MCV: 97.6 fL (ref 78.0–100.0)
Platelets: 180 10*3/uL (ref 150–400)
RBC: 4.24 MIL/uL (ref 3.87–5.11)
RDW: 12.6 % (ref 11.5–15.5)
WBC: 5.5 10*3/uL (ref 4.0–10.5)

## 2013-11-18 LAB — PROTIME-INR
INR: 1.01 (ref 0.00–1.49)
Prothrombin Time: 13.1 seconds (ref 11.6–15.2)

## 2013-11-18 MED ORDER — SODIUM CHLORIDE 0.9 % IV SOLN: INTRAVENOUS | Status: DC

## 2013-11-18 NOTE — Progress Notes (Signed)
Patient ID: Brenda Gardner, female   DOB: 05-25-1933, 80 y.o.   MRN: [id number]   Pt scheduled today for L lung mass biopsy  She has been aware of this lesion for almost 1 yr Had been evaluated by Dr Roxan Hockey and was scheduled for surgery summer of 2014 but cancelled secondary to her mothers health.   Has been revaluated with Dr Roxan Hockey and recommendation is for VATS. Pt is afraid of general anesthesia and has refused surgery for now.  She is now at [facility] for IR LUL lung mass biopsy using conscious sedation. Plan for possible radiation treatment per Dr Juliann Mule note.  After long discussion and review of risks and benefits including but not limited to: infection; bleeding; pneumothorax; pt has decided to hold off on biopsy and discuss again with family and physician.  BP: 187/106; 198/96; 194/92  Pt has informed her dtr of her decision. Dtr to return [facility].

## 2013-11-19 NOTE — Progress Notes (Signed)
Thoracic Location of Tumor / Histology:  Left upper lobe mass  Patient presented earlier this year with a left upper lobe nodule found on a chest x ray.  Biopsies revealed: was scheduled for 11/18/2013.  Patient refused to have it done.   Tobacco/Marijuana/Snuff/ETOH use: current every day smoker - history of smoking for 50 years  Past/Anticipated interventions by cardiothoracic surgery, if any: patient refused surgery.   Past/Anticipated interventions by medical oncology, if any: saw Dr. Juliann Mule - 10/15/13  Signs/Symptoms  Weight changes, if any:   Respiratory complaints, if any:   Hemoptysis, if any:   Pain issues, if any:    SAFETY ISSUES:  Prior radiation? no  Pacemaker/ICD? no  Possible current pregnancy? no  Is the patient on methotrexate? no  Current Complaints / other details:

## 2013-11-24 ENCOUNTER — Encounter: Payer: Self-pay | Admitting: Radiation Oncology

## 2013-11-24 ENCOUNTER — Ambulatory Visit: Payer: Medicare Other

## 2013-11-24 ENCOUNTER — Ambulatory Visit
Admission: RE | Admit: 2013-11-24 | Discharge: 2013-11-24 | Disposition: A | Discharge status: Home / Self Care | Payer: Medicare Other | Source: Ambulatory Visit | Attending: Radiation Oncology | Admitting: Radiation Oncology

## 2013-11-24 DIAGNOSIS — R911 Solitary pulmonary nodule: Secondary | ICD-10-CM

## 2013-11-24 NOTE — Progress Notes (Addendum)
Radiation Oncology         [phone] ________________________________  Initial outpatient Consultation  Name: Brenda Gardner MRN: [id number]  Date:  11/30/13 DOB: 02/06/1933  CC:No PCP Per Patient  Brenda Gardner, *   REFERRING PHYSICIAN: Melrose Gardner, *  DIAGNOSIS: PET positive pulmonary nodule in the left apical lung  HISTORY OF PRESENT ILLNESS::Brenda Gardner is a 80 y.o. female who is seen as a courtesy of Brenda Gardner for an opinion concerning radiation therapy for management of what appears to be clinical stage I lung cancer.Brenda Gardner is a 80 year old woman with a history of tobacco abuse who was found las year to have a left upper lobe nodule. She was followed by Brenda Gardner for a hemochromatosis. She saw Brenda Gardner last year who recommended a chest x-ray since she had not had one in while. That showed a questionable left upper lobe nodule and led to a CT scan, which confirmed a spiculated 9.9 mm left upper lobe mass. A repeat CT was done in June and showed the mass was still present. That led to a PET/CT which showed the lesion to be hypermetabolic with an SUV of 4.4. Patient was seen by cardiothoracic surgery and she was scheduled for surgery. Patient ultimately refused this surgery due to family issues and fear of anesthesia. Patient was seen again earlier this year by medical oncology and a repeat chest CT scan was performed which showed enlargement of the left apical pulmonary nodule. Patient was seen again by cardiothoracic surgery but again refuse surgical intervention. The patient did agree to biopsy of the lung mass and was actually in the radiology department but then refused to proceed with this procedure. This information the patient is now seen in radiation oncology for evaluation and consideration for treatment without a tissue diagnosis.   PREVIOUS RADIATION THERAPY: No  PAST MEDICAL HISTORY:  has a past medical history of  Hypertension and Hemochromatosis.    PAST SURGICAL HISTORY: Past Surgical History  Procedure Laterality Date  . Tubal ligation    . Appendectomy    . Tonsillectomy    . Vein ligation Bilateral 1980's    FAMILY HISTORY: family history includes Mesothelioma in her father.  SOCIAL HISTORY:  reports that she has been smoking Cigarettes.  She has a 12.5 pack-year smoking history. She has never used smokeless tobacco. She reports that she drinks about 3.5 ounces of alcohol per week.  ALLERGIES: Lisinopril  MEDICATIONS:  Current Outpatient Prescriptions  Medication Sig Dispense Refill  . amLODipine (NORVASC) 2.5 MG tablet Take 2.5 mg by mouth daily.      . metoprolol succinate (TOPROL-XL) 25 MG 24 hr tablet Take 25 mg by mouth daily.      . Multiple Vitamin (MULTIVITAMIN) tablet Take 1 tablet by mouth daily.       No current facility-administered medications for this encounter.    REVIEW OF SYSTEMS:  A 15 point review of systems is documented in the electronic medical record. This was obtained by the nursing staff. However, I reviewed this with the patient to discuss relevant findings and make appropriate changes.  . She does get short of breath with walking up one flight of stairs. She seldom walks up any incline. She denies any chest pain, pressure, or tightness. She does have hypertension but no other significant cardiovascular history. She denies frequent cough, hemoptysis, wheezing. She lost a lot of weight while caring for her mother, but says she has gained back 3  pounds over the past 3 months. She has had soreness along her left posterior lateral oral tongue.  This is been present 3-4 weeks. She has noticed occasional bleeding from the area and feels she bites this area at night while asleep.    PHYSICAL EXAM:    General Appearance:    Alert, cooperative, no distress, appears stated age, accompanied by daughter on evaluation today   Head:    Normocephalic, without obvious  abnormality, atraumatic  Eyes:    PERRL, conjunctiva/corneas clear, EOM's intact,       Nose:   Nares normal, septum midline, mucosa normal, no drainage    or sinus tenderness  Throat:   Lips, mucosa, and  Normal; denture in place along the maxillary region, along the left posterior lateral oral tongue is a  1 1-1/2 cm lesion. This is quite painful with palpation   Neck:   Supple, symmetrical, trachea midline, no adenopathy;    thyroid:  no enlargement/tenderness/nodules; no carotid   bruit or JVD  Back:     Symmetric, no curvature, ROM normal, no CVA tenderness  Lungs:     Clear to auscultation bilaterally, respirations unlabored  Chest Wall:    No tenderness or deformity   Heart:    Regular rate and rhythm, S1 and S2 normal, no murmur, rub   or gallop     Abdomen:     Soft, non-tender, bowel sounds active all four quadrants,    no masses, no organomegaly        Extremities:   Extremities normal, atraumatic, no cyanosis or edema  Pulses:   2+ and symmetric all extremities  Skin:   Skin color, texture, turgor normal, no rashes or lesions  Lymph nodes:   Cervical, supraclavicular, and axillary nodes normal  Neurologic:   normal strength, sensation and reflexes    throughout     ECOG = 0  0 - Asymptomatic (Fully active, able to carry on all predisease activities without restriction)   LABORATORY DATA:  Lab Results  Component Value Date   WBC 5.5 11/18/2013   HGB 14.6 11/18/2013   HCT 41.4 11/18/2013   MCV 97.6 11/18/2013   PLT 180 11/18/2013   NEUTROABS 2.8 10/08/2013   Lab Results  Component Value Date   NA 140 10/08/2013   K 4.2 10/08/2013   CL 103 02/05/2013   CO2 25 10/08/2013   GLUCOSE 122 10/08/2013   CREATININE 0.8 10/08/2013   CALCIUM 10.0 10/08/2013      RADIOGRAPHY: Outlined in the history of present illness    IMPRESSION:  PET positive pulmonary nodule in the left apical lung. At this time the patient has refused surgical intervention and biopsy of the lesion for severe  anxiety related to the anesthesia.  I discussed consideration for treatment without tissue diagnosis. We discussed the controversy concerning this issue. I also recommended consideration for surgical intervention and biopsy of the lesion. The patient again refuses both of these interventions.  Patient would be a candidate for a definitive course of radiation therapy without tissue diagnosis. She however would not be a candidate for SBRT in light of the location of the lesion.  I would anticipate an accelerated course of radiation treatment.     PLAN: Patient will be rescheduled for PET scan to rule out development of nodal metastasis. This scan will also give insight as to the lesion along her oral tongue. If the PET scan shows no other new lesions or if she has local regional  spread only, the patient will be scheduled for simulation and treatment.  The patient will be referred to ENT for the lesion along the tongue. Total time spent in the encounter was 60 minutes.   ------------------------------------------------  Blair Promise, PhD, MD

## 2013-11-30 ENCOUNTER — Ambulatory Visit
Admission: RE | Admit: 2013-11-30 | Discharge: 2013-11-30 | Disposition: A | Discharge status: Home / Self Care | Payer: Medicare Other | Source: Ambulatory Visit | Attending: Radiation Oncology | Admitting: Radiation Oncology

## 2013-11-30 ENCOUNTER — Encounter: Payer: Self-pay | Admitting: Radiation Oncology

## 2013-11-30 VITALS — BP 195/83 | HR 72 | Temp 98.5°F | Ht 66.0 in | Wt 110.5 lb

## 2013-11-30 DIAGNOSIS — R911 Solitary pulmonary nodule: Secondary | ICD-10-CM

## 2013-11-30 DIAGNOSIS — C029 Malignant neoplasm of tongue, unspecified: Secondary | ICD-10-CM | POA: Insufficient documentation

## 2013-11-30 NOTE — Progress Notes (Signed)
Thoracic Location of Tumor / Histology: Left upper lobe mass   Patient presented earlier this year with a left upper lobe nodule found on a chest x ray.   Biopsies revealed: was scheduled for 11/18/2013. Patient refused to have it done.  Tobacco/Marijuana/Snuff/ETOH use: current every day smoker - smokes 1/2 ppd. history of smoking for 50 years  Drinks 1-2 drinks per night.  Past/Anticipated interventions by cardiothoracic surgery, if any: patient refused surgery.   Past/Anticipated interventions by medical oncology, if any: saw Dr. Juliann Mule - 10/15/13   Signs/Symptoms  Weight changes, if any: no Respiratory complaints, if any: coughs when she first gets up in the morning.  Denies shortness of breath. Hemoptysis, if any: no Pain issues, if any: no  SAFETY ISSUES:  Prior radiation? no  Pacemaker/ICD? no  Possible current pregnancy? no  Is the patient on methotrexate? No  Current Complaints / other details: Patient is here with her daughter.  She reports that she has white coat syndrome.  Her bp today is 195/83.

## 2013-11-30 NOTE — Progress Notes (Signed)
Please see the Nurse Progress Note in the MD Initial Consult Encounter for this patient. 

## 2013-12-01 ENCOUNTER — Telehealth: Payer: Self-pay | Admitting: *Deleted

## 2013-12-01 NOTE — Telephone Encounter (Signed)
CALLED PATIENT TO INFORM OF TEST ON 12-09-13 AND ENT APPT. ON 12-10-13 WITH DR. Constance Holster, SPOKE WITH PATIENT AND SHE IS AWARE OF BOTH THESE APPTS.

## 2013-12-03 NOTE — Addendum Note (Signed)
Encounter addended by: Deirdre Evener, RN on: 12/03/2013  6:15 PM<BR>     Documentation filed: Charges VN

## 2013-12-09 ENCOUNTER — Ambulatory Visit (HOSPITAL_COMMUNITY)
Admission: RE | Admit: 2013-12-09 | Discharge: 2013-12-09 | Disposition: A | Discharge status: Home / Self Care | Payer: Medicare Other | Source: Ambulatory Visit | Attending: Radiation Oncology | Admitting: Radiation Oncology

## 2013-12-09 ENCOUNTER — Encounter (HOSPITAL_COMMUNITY): Payer: Self-pay

## 2013-12-09 DIAGNOSIS — R221 Localized swelling, mass and lump, neck: Secondary | ICD-10-CM

## 2013-12-09 DIAGNOSIS — R911 Solitary pulmonary nodule: Secondary | ICD-10-CM | POA: Insufficient documentation

## 2013-12-09 DIAGNOSIS — R220 Localized swelling, mass and lump, head: Secondary | ICD-10-CM | POA: Insufficient documentation

## 2013-12-09 DIAGNOSIS — R222 Localized swelling, mass and lump, trunk: Secondary | ICD-10-CM | POA: Insufficient documentation

## 2013-12-09 LAB — GLUCOSE, CAPILLARY: GLUCOSE-CAPILLARY: 131 mg/dL — AB (ref 70–99)

## 2013-12-09 MED ORDER — FLUDEOXYGLUCOSE F - 18 (FDG) INJECTION
6.1000 | Freq: Once | INTRAVENOUS | Status: AC | PRN
  Administered 2013-12-09: 6.1 via INTRAVENOUS

## 2013-12-13 ENCOUNTER — Other Ambulatory Visit (HOSPITAL_BASED_OUTPATIENT_CLINIC_OR_DEPARTMENT_OTHER): Payer: Medicare Other

## 2013-12-13 ENCOUNTER — Encounter: Payer: Self-pay | Admitting: Internal Medicine

## 2013-12-13 ENCOUNTER — Telehealth: Payer: Self-pay | Admitting: Internal Medicine

## 2013-12-13 ENCOUNTER — Ambulatory Visit (HOSPITAL_BASED_OUTPATIENT_CLINIC_OR_DEPARTMENT_OTHER): Payer: Medicare Other | Admitting: Internal Medicine

## 2013-12-13 DIAGNOSIS — R911 Solitary pulmonary nodule: Secondary | ICD-10-CM

## 2013-12-13 DIAGNOSIS — I10 Essential (primary) hypertension: Secondary | ICD-10-CM

## 2013-12-13 DIAGNOSIS — F17200 Nicotine dependence, unspecified, uncomplicated: Secondary | ICD-10-CM

## 2013-12-13 DIAGNOSIS — D490 Neoplasm of unspecified behavior of digestive system: Secondary | ICD-10-CM

## 2013-12-13 DIAGNOSIS — R634 Abnormal weight loss: Secondary | ICD-10-CM

## 2013-12-13 DIAGNOSIS — Z720 Tobacco use: Secondary | ICD-10-CM

## 2013-12-13 LAB — COMPREHENSIVE METABOLIC PANEL (CC13)
ALBUMIN: 4 g/dL (ref 3.5–5.0)
ALK PHOS: 94 U/L (ref 40–150)
ALT: 11 U/L (ref 0–55)
ANION GAP: 12 meq/L — AB (ref 3–11)
AST: 30 U/L (ref 5–34)
BUN: 10.3 mg/dL (ref 7.0–26.0)
CALCIUM: 9.8 mg/dL (ref 8.4–10.4)
CHLORIDE: 103 meq/L (ref 98–109)
CO2: 26 meq/L (ref 22–29)
Creatinine: 0.8 mg/dL (ref 0.6–1.1)
Glucose: 106 mg/dl (ref 70–140)
Potassium: 4.2 mEq/L (ref 3.5–5.1)
Sodium: 141 mEq/L (ref 136–145)
Total Bilirubin: 0.62 mg/dL (ref 0.20–1.20)
Total Protein: 7.8 g/dL (ref 6.4–8.3)

## 2013-12-13 LAB — CBC WITH DIFFERENTIAL/PLATELET
BASO%: 0.3 % (ref 0.0–2.0)
Basophils Absolute: 0 10*3/uL (ref 0.0–0.1)
EOS%: 1 % (ref 0.0–7.0)
Eosinophils Absolute: 0 10*3/uL (ref 0.0–0.5)
HCT: 43.8 % (ref 34.8–46.6)
HGB: 14.5 g/dL (ref 11.6–15.9)
LYMPH%: 24.4 % (ref 14.0–49.7)
MCH: 32.7 pg (ref 25.1–34.0)
MCHC: 33.2 g/dL (ref 31.5–36.0)
MCV: 98.6 fL (ref 79.5–101.0)
MONO#: 0.8 10*3/uL (ref 0.1–0.9)
MONO%: 15.6 % — ABNORMAL HIGH (ref 0.0–14.0)
NEUT#: 2.9 10*3/uL (ref 1.5–6.5)
NEUT%: 58.7 % (ref 38.4–76.8)
Platelets: 161 10*3/uL (ref 145–400)
RBC: 4.45 10*6/uL (ref 3.70–5.45)
RDW: 13.8 % (ref 11.2–14.5)
WBC: 5 10*3/uL (ref 3.9–10.3)
lymph#: 1.2 10*3/uL (ref 0.9–3.3)

## 2013-12-13 LAB — FERRITIN CHCC: Ferritin: 30 ng/ml (ref 9–269)

## 2013-12-13 NOTE — Progress Notes (Signed)
[facility] OFFICE PROGRESS NOTE  No PCP Per Patient No address on file  DIAGNOSIS: Hemochromatosis, hereditary - Plan: Iron and TIBC  Nodule of left lung  Tobacco abuse - Plan: CBC with Differential, Basic metabolic panel (Bmet) - [facility], Ferritin  Weight loss  Chief Complaint  Patient presents with  . Hemochromatosis, hereditary   CURRENT THERAPY: Phlebotomy to maintain ferritin less than 50.   INTERVAL HISTORY: Brenda Gardner 80 y.o. female with a history of hypertension, hereditary hemochromatosis, homozygous for the C282Y gene since September 2009 who is here for follow-up.  She was last seen by me on 08/06/2013.     Previously, she was referred to Dr. Erasmo Leventhal secondary to nodule in the left apex that appeared to be malignant and had an SUV of 4.4 without additional metabolic activity.  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  This caused substantial delay for her follow with Dr. Roxan Hockey.  She also reports significant anxiety related to anticipated anesthesia.  She was instructed to follow up with Dr. Roxan Hockey but stated she did not because she was too anxious. In the interim, we repeated her CT of Chest which demonstrated in increase in the size of the pulmonary nodule. She saw Dr. Roxan Hockey who recommended that she have a biopsy as she declined surgery.  When going to obtain biopsy, she reports increased anxiety and elevated blood pressures.  She then declined to continue.  Dr. Roxan Hockey referred her to Dr. Sondra Come on 3/25 who recommended that she obtain a PET to further evaluate a new tongue lesion and to determine the extent of the disease in her chest.  She lost a few pounds since her last.  She saw the ENT Doctor this past Friday, Dr. Constance Holster.   He looked at it and determined the need for removal.  He would discuss with Dr. Sondra Come on Wednesday and discuss doing the weekly  conference.  She denies hemoptysis. She admits to smoking less than one-half of pack of cigarettes daily.    She reports the tongue lesion started about one month ago with worsening discomfort over the past one week.  She takes ibuprofen as needed for relief.   MEDICAL HISTORY: Past Medical History  Diagnosis Date  . Hypertension   . Hemochromatosis     INTERIM HISTORY: has Hemochromatosis, hereditary; Weight loss; Cough; Nodule of left lung; Hypertension; and Tobacco abuse on her problem list.    ALLERGIES:  is allergic to lisinopril.  MEDICATIONS: has a current medication list which includes the following prescription(s): amlodipine, metoprolol succinate, and multivitamin.  SURGICAL HISTORY:  Past Surgical History  Procedure Laterality Date  . Tubal ligation    . Appendectomy    . Tonsillectomy    . Vein ligation Bilateral 1980's   PROBLEM LIST:  1. Hereditary hemochromatosis, homozygous for the C282Y gene with diagnosis established in September 2009 at which time, the ferritin level was 4484. The patient underwent a 1-unit phlebotomy on 05/18/2010 and on 12/07/2010. She underwent a 1/2-unit phlebotomy on 04/22/2012, 09/29/2012, 10/06/2012, and 10/27/2012. The patient now receives 200 mL of normal saline with each half-unit phlebotomy.  2. History of hypertension.  3. Abnormal MRI of the liver showing increased iron deposition within the liver as well as evidence of cirrhosis in October 2009. Hepatomegaly is present on physical exam.  4. Weight loss over 2013. The patient's baseline weight has been running 120 pounds.  On 09/29/2012 weight was 106 pounds.  5. Cough started in December of 2013. The patient's cough has resolved.  6. Nodule seen in the apex of the left lung on CT scan of the chest carried out without IV contrast on 11/16/2012.  REVIEW OF SYSTEMS:   Constitutional: Denies fevers, chills or abnormal weight loss Eyes: Denies blurriness of vision Ears, nose, mouth,  throat, and face: Denies mucositis or sore throat Respiratory: Denies cough, dyspnea or wheezes Cardiovascular: Denies palpitation, chest discomfort or lower extremity swelling Gastrointestinal:  Denies nausea, heartburn or change in bowel habits Skin: Denies abnormal skin rashes Lymphatics: Denies new lymphadenopathy or easy bruising Neurological:Denies numbness, tingling or new weaknesses Behavioral/Psych: Mood is stable, no new changes  All other systems were reviewed with the patient and are negative.  PHYSICAL EXAMINATION: ECOG PERFORMANCE STATUS: 1 - Symptomatic but completely ambulatory  Blood pressure 179/87, pulse 87, temperature 98.6 F (37 C), temperature source Oral, resp. rate 17, height 5\' 6"  (1.676 m), weight 108 lb 8 oz (49.215 kg).  GENERAL:alert, no distress and comfortable; very thin, frail appearing female.  SKIN: skin color, texture, turgor are normal, no rashes or significant lesions; very brittle appearing skin.  EYES: normal, Conjunctiva are pink and non-injected, sclera clear OROPHARYNX:no exudate, no erythema and lips, buccal mucosa, and tongue with raised area about 1 cm on the lateral surface of the tongue, TTP.  NECK: supple, thyroid normal size, non-tender, without nodularity LYMPH:  no palpable lymphadenopathy in the cervical, axillary or supraclavicular LUNGS: clear to auscultation with normal breathing effort with a prolonged expiratory phase HEART: regular rate & rhythm and no murmurs and no lower extremity edema ABDOMEN:abdomen soft, non-tender and normal bowel sounds Musculoskeletal:no cyanosis of digits and no clubbing  NEURO: alert & oriented x 3 with fluent speech, no focal motor/sensory deficits; cautious gait.    Labs:  Lab Results  Component Value Date   WBC 5.0 12/13/2013   HGB 14.5 12/13/2013   HCT 43.8 12/13/2013   MCV 98.6 12/13/2013   PLT 161 12/13/2013   NEUTROABS 2.9 12/13/2013      Chemistry      Component Value Date/Time   NA 141  12/13/2013 1227   NA 137 04/15/2012 1531   K 4.2 12/13/2013 1227   K 4.4 04/15/2012 1531   CL 103 02/05/2013 1136   CL 98 04/15/2012 1531   CO2 26 12/13/2013 1227   CO2 29 04/15/2012 1531   BUN 10.3 12/13/2013 1227   BUN 9 04/15/2012 1531   CREATININE 0.8 12/13/2013 1227   CREATININE 0.67 04/15/2012 1531      Component Value Date/Time   CALCIUM 9.8 12/13/2013 1227   CALCIUM 9.4 04/15/2012 1531   ALKPHOS 94 12/13/2013 1227   ALKPHOS 89 04/15/2012 1531   AST 30 12/13/2013 1227   AST 61* 04/15/2012 1531   ALT 11 12/13/2013 1227   ALT 23 04/15/2012 1531   BILITOT 0.62 12/13/2013 1227   BILITOT 0.7 04/15/2012 1531     Iron/TIBC/Ferritin    Component Value Date/Time   IRON 269* 10/01/2013 1217   IRON 95 02/05/2013 1136   TIBC 255 10/01/2013 1217   TIBC 293 02/05/2013 1136   FERRITIN 65 10/08/2013 1302   FERRITIN 33 12/15/2012 1004    CBC:  Recent Labs Lab 12/13/13 1227  WBC 5.0  NEUTROABS 2.9  HGB 14.5  HCT 43.8  MCV 98.6  PLT 161   Studies:  No results found.  RADIOGRAPHIC STUDIES: 1. MRI of the  abdomen with and without IV contrast on 06/05/2008 showed marked iron deposition within the liver with sparing of the spleen consistent with genetic hemochromatosis. There was atrophy of the left lateral hepatic lobe with mild linear enhancement. This was favored to be most consistent with cirrhosis rather than hepatocellular carcinoma. There was mild iron deposition within the pancreas.  2. Complete abdominal ultrasound of the abdomen on 05/09/2009 showed no gallstones within the gallbladder and a normal common bile duct. There was heterogeneous echotexture of the liver, felt to be due to hepatitis or cirrhosis. There was no focal hepatic mass. No hydronephrosis or renal calculi.  3. Chest x-ray, 2 view, from 07/27/2009 showed mild cardiomegaly with no acute findings.  4. MRI of the abdomen with and without IV contrast on 04/28/2012 showed a decrease in the apparent degree of iron deposition within the  nodular appearing liver compatible with improvement and secondary evidence of hemochromatosis. There were no enhancing lesions to suggest hepatocellular carcinoma. There was mild pancreatic iron deposition. There was evidence of left hepatic lobe atrophy with probable internal fibrosis/scarring. The study was compared with the prior MRI from 06/05/2008 and the previous ultrasound from 05/09/2009. Consideration of annual MRI surveillance was stated.  5. Chest x-ray, 2 view, from 11/05/2012 showed COPD. There was a possible sub-centimeter nodule in the left upper lobe. Chest CT scan was recommended. Chest x-ray was compared with the previous chest x-ray from 07/27/2009.  6. CT scan of the chest without IV contrast from 11/16/2012 showed a 10 x 7 mm left apical lesion suspicious for neoplasm. Scarring could not be excluded. There were underlying emphysematous changes without mediastinal or hilar lymphadenopathy. There were moderate atherosclerotic calcifications involving the aorta and branch vessels.  7. CT scan of the chest without IV contrast on 02/01/2013 showed stable appearance of the 7 x 10-mm spiculated nodule in the anterior left upper lobe. A PET CT scan was suggested.  8. CT scan of the chest without IV contrast on 10/08/2013 showed further increase in size of left apical nodule which is concerning for primary neoplasm of the lung and a calcified atherosclerotic disease including multi vessel coronary artery calcifications. 9. PET scan on 12/09/2013.  This demonstrated an enlarging left apical lung mass with neoplastic range FDG uptake increased since the prior PET-CT. No findings for mediastinal or hilar lymphadenopathy or pulmonary metastasis. 19 x 12 mm soft tissue mass associated with the eft aspect of the tongue.  This is metabolically active and consistent with neoplasm. No neck adenopathy.   ASSESSMENT: Brenda Gardner 80 y.o. female with a history of Hemochromatosis, hereditary - Plan: Iron  and TIBC  Nodule of left lung  Tobacco abuse - Plan: CBC with Differential, Basic metabolic panel (Bmet) - [facility], Ferritin  Weight loss  PLAN:   1. Hemochromatosis.  --She continues to do well overall from this standpoint.   Her ferritin is  Pending today.  She will require a phlebotomy in the next day or well.   .   2. Hypertension. --She was counseled to avoid high salt diet. She reports that her blood pressures are better controlled at home.   3. Nodule of left lung concerning for primary neoplasm of the lung. --Patient reported that she will schedule a follow-up for her Left lung nodule previously with Dr. Roxan Hockey.  Repeat CT of chest without contrast to assess for growth showed further increase in size of left apical nodule which is concerning for primary neoplasm of the lung.  A referral  was made to Cardio-thoracic surgery. She decline surgery and subsequent referral for biopsy.  PET reviewed in depth today demonstrating new tongue lesion as well.  4. Tongue lesion, likely malignancy. --She was seen by ENT who will further discuss potential treatment options in light of #3.     5. Weight lost. --I counseled her to increase her ensure to 2-3 cans daily. She has now lost 1-2 pounds since her last visit.   6. Tobacco Abuse. -- Patient was counseled to continue to decrease her smoking.   She did elect for referral to tobacco cessation classes.   7.  Follow-up. --Patient will return to our office in 4 weeks for repeat labs including CBC, CMET, iron studies and ferritin.   All questions were answered. The patient knows to call the clinic with any problems, questions or concerns. We can certainly see the patient much sooner if necessary.  I spent 15 minutes counseling the patient face to face. The total time spent in the appointment was 25 minutes.    Concha Norway, MD 12/13/2013 1:27 PM

## 2013-12-13 NOTE — Patient Instructions (Signed)
Cancer of the Tongue Cancer of the tongue occurs when a group of cells on the tongue become abnormal and start to grow out of control. Most of the time, tongue cancer starts in very thin, flat cells that cover the surface of your tongue (squamous cells). Cancer cells can spread and form a mass of cells called a tumor. The tumor may spread deeper into the tongue, or it may spread to other areas of the body (metastasize). RISK FACTORS The exact cause of cancer of the tongue is not known. However, some risk factors make this more likely:  Use of tobacco products, including cigarettes, pipes, cigars, smokeless (chewing) tobacco, and snuff. This is the number one risk factor of cancer of the tongue.  Female gender.  Age of 60 years or older.  Poor oral hygiene (not brushing or flossing your teeth regularly).  Frequent use of alcohol.  Human papillomavirus (HPV) infection.  Family history of tongue cancer. SYMPTOMS Tongue cancer can start in 1 of 2 places. It can start at the front part of the tongue or at the base of the tongue at the back of your throat.Symptoms of cancer at the front part of your tongue may include:  A lump or sore on your tongue that may be painful (especially when you eat or speak) and may not heal. It also may bleed easily if you bite it or touch it.  Numbness on your tongue.  Difficulty moving your tongue.  Pain when chewing.  Difficulty pronouncing or saying certain words or making certain sounds.  A bad odor in your mouth.  A lump on your neck. Cancer at the base of the tongue is harder to see. Symptoms may not show up as soon as they do for cancer at the front part of your tongue. Symptoms may include:  A feeling in your throat that you are choking (especially when you are lying down).  Difficulty swallowing or pain when you swallow.  A muffled voice.  Ear pain.  Pain when you try to open your  mouth or difficulty opening your mouth.  A lump on your neck. DIAGNOSIS To diagnose tongue cancer, your caregiver may perform the following exams:  A physical exam of your mouth, throat, and neck for a sore or lump. Your caregiver may use a mirror with a long handle or a thin, flexible tube with a tiny light and camera at the end (fiberscope) to see the back of your mouth.  Removal and exam of a small number of cells (biopsy) from your tongue or a lump on your neck. The cells are checked for cancerous formations under a microscope.  Imaging exams, such as X-rays of your mouth and neck. The images can show if there is an abnormal mass. If you do have cancer, your caregiver will stage your cancer. Staging provides an idea of how advanced your cancer is. The stage will depend on how much your cancer has grown and if it has metastasized. The meaning of the stage depends on the type of cancer. For tongue cancer:  Stage I means the cancer is the size of a peanut or smaller. It has not metastasized.  Stage II means the cancer is larger than a peanut, but not larger than a walnut. It has not metastasized.  Stage III means the cancer has grown larger than a walnut. It may have spread to a lymph node or lymph gland on the same side of your neck as the cancer. (Lymph is a  fluid that carries white blood cells all over your body. White blood cells fight infection.)  Stage IV means the cancer has spread to nearby areas. It may have spread heavily into your lymph glands. TREATMENT Treatment for tongue cancer can vary. It will depend on the stage and location of your cancer and your overall health. Treatment options may include:  Radiation therapy. This uses waves of nuclear energy to kill cancer cells on your tongue. It may be used for stage I and II cancers. It often is used for cancers at the base of your tongue.  Surgery:  Surgery is done if your tumor is small, has not spread, and is at the front of  your tongue.  Surgery may be done to remove tumors that have spread into your neck or lymph nodes.  A combination of surgery, radiation, and drugs that kills cancer cells (chemotherapy). This may be done for stage III and stage IV cancers. SEEK MEDICAL CARE IF:  Your tongue hurts or is numb.  The way you speak changes.  The way you swallow changes.  You notice a lump on your neck.  You have an oral temperature above 100.5 F (38.1 C). SEEK IMMEDIATE MEDICAL CARE IF:   You have pain that gets worse.  Your tongue or mouth bleeds.  Your lip, mouth, or neck swells.  You have trouble swallowing.  You have trouble breathing.  You have an oral temperature above 102 F (38.9 C). Document Released: 12/04/2010 Document Revised: 11/11/2011 Document Reviewed: 12/04/2010 [facility] Patient Information 2014 [place].

## 2013-12-13 NOTE — Telephone Encounter (Signed)
Gave pt appt for lab and md for may,talked to [facility] @ [employer] for smoking cessation they will contact pt , pt aware , gave telephoner number to pt

## 2013-12-15 ENCOUNTER — Encounter: Payer: Self-pay | Admitting: *Deleted

## 2013-12-15 NOTE — Progress Notes (Signed)
Brenda Gardner Psychosocial Distress Screening Clinical Social Work  Clinical Social Work was referred by distress screening protocol.  The patient scored a 5 on the Psychosocial Distress Thermometer which indicates moderate distress. Clinical Social Worker contacted patient by phone to assess for distress and other psychosocial needs. The patient reports she is awaiting a return call from Brenda Gardner office with a plan to address her tongue lesion/swallowing issues.  She expressed no concerns aside from awaiting medical update from MD.  CSW briefly shared information on CSW role and support services and encouraged patient to call if any needs arise. Patient verbalized understanding.   Clinical Social Worker follow up needed: no  If yes, follow up plan:  [facility] 11/30/2013  Screening Type Initial Screening  Mark the number that describes how much distress you have been experiencing in the past week 5  Emotional problem type Nervousness/Anxiety  Physical Problem type Mouth sores/swallowing  Physician notified of physical symptoms Yes    Polo Riley, MSW, LCSW, OSW-C Clinical Social Worker [place] [phone]

## 2013-12-27 ENCOUNTER — Encounter (HOSPITAL_COMMUNITY): Payer: Self-pay

## 2013-12-27 NOTE — H&P (Signed)
Assessment  Lung nodule (793.11) (R91.1). Tongue neoplasm (239.0) (D37.02). Smokes tobacco daily (305.1) (Z72.0). Discussed  Left oral tongue lesion, most likely carcinoma. Currently being worked up for a left pulmonary nodule. She has elected to have radiation without or biopsy. For the tongue, the best treatment is going to be surgical which would involve a partial glossectomy and modified neck dissection. She seems amenable to this. I will present her at tumor Board next weeks as we can discuss the best way to proceed with these 2 potential malignancies. Reason For Visit  Brenda Gardner is here today at the kind request of Gery Pray for consultation and opinion for sore on tongue and earache. HPI  Long-time smoking history. Left pulmonary nodule increased in size over the past year, positive on PET scan. She was going to have radiation treatments for this without a biopsy. In the meantime she has developed a sore on the left side of her tongue over the past 3 weeks that is painful and causing referred otalgia on that side. It is also positive on PET scan. Allergies  Lisinopril TABS. Current Meds  Norvasc TABS (AmLODIPine Besylate);; RPT Toprol XL 25 MG Oral Tablet Extended Release 24 Hour (Metoprolol Succinate ER);; RPT. Active Problems  Hypertension   (401.9) (I10) Lung nodule   (793.11) (R91.1). PMH  History of malignant neoplasm of skin (V10.83) (F74.944) History of vascular surgery (V45.89) (Z98.89). PSH  Appendectomy Cataract Surgery Oral Surgery Tooth Extraction Tonsillectomy Tubal Ligation. Family Hx  Family history of hearing loss: Mother (V19.2) (Z82.2) Family history of hypertension: Mother (V17.49) (Z82.49) Seasonal allergies: Mother (J30.2). Personal Hx  Alcohol use (V49.89) (F10.99); social >2 drinks day Caffeine use (V49.89) (Z78.9); 2 cups daily Current smoker (305.1) (Z72.0). ROS  Systemic: Feeling tired (fatigue).  No fever, no night sweats, and no recent  weight loss. Head: No headache. Eyes: No eye symptoms. Otolaryngeal: No hearing loss.  Earache.  No tinnitus  and no purulent nasal discharge.  No nasal passage blockage (stuffiness), no snoring, no sneezing, no hoarseness, and no sore throat. Cardiovascular: No chest pain or discomfort  and no palpitations. Pulmonary: No dyspnea, no cough, and no wheezing. Gastrointestinal: No dysphagia  and no heartburn.  No nausea, no abdominal pain, and no melena.  No diarrhea. Genitourinary: No dysuria. Endocrine: No muscle weakness. Musculoskeletal: No calf muscle cramps, no arthralgias, and no soft tissue swelling. Neurological: No dizziness, no fainting, no tingling, and no numbness. Psychological: No anxiety  and no depression. Skin: No rash. 12 system ROS was obtained and reviewed on the Health Maintenance form dated today.  Positive responses are shown above.  If the symptom is not checked, the patient has denied it. Vital Signs   Recorded by Skolimowski,Sharon on 10 Dec 2013 02:19 PM BP:148/68,  Height: 5 ft 6 in, Weight: 110 lb 8 oz, BMI: 17.8 kg/m2,  BMI Calculated: 17.84 ,  BSA Calculated: 1.55. Physical Exam  APPEARANCE: Well developed, thin lady, in no acute distress.  Normal affect, in a pleasant mood.  Oriented to time, place and person. COMMUNICATION: Normal voice   HEAD & FACE:  No scars, lesions or masses of head and face.  Sinuses nontender to palpation.  Salivary glands without mass or tenderness.  Facial strength symmetric.  No facial lesion, scars, or mass. EYES: EOMI with normal primary gaze alignment. Visual acuity grossly intact.  PERRLA EXTERNAL EAR & NOSE: No scars, lesions or masses  EAC & TYMPANIC MEMBRANE:  EAC shows no obstructing lesions or debris  and tympanic membranes are normal bilaterally with good movement to insufflation. GROSS HEARING: Normal   TMJ:  Nontender  INTRANASAL EXAM: No polyps or purulence.  NASOPHARYNX: Normal, without lesions. LIPS, TEETH & GUMS:  No lip lesions, normal dentition and normal gums. ORAL CAVITY/OROPHARYNX: 2-3 cm raised superficial lesion left lateral oral tongue. Floor of mouth is clear. It does not approach the midline. NECK:  Supple without adenopathy or mass. THYROID:  Normal with no masses palpable.  NEUROLOGIC:  No gross CN deficits. No nystagmus noted.   LYMPHATIC:  No enlarged nodes palpable, although the exam is limited due to her excessive tickleshness. Signature  Electronically signed by : [name]  M.D.; 12/10/2013 2:35 PM EST.

## 2013-12-27 NOTE — H&P (Signed)
Assessment  Lung nodule (793.11) (R91.1). Tongue neoplasm (239.0) (D37.02). Smokes tobacco daily (305.1) (Z72.0). Discussed  Left oral tongue lesion, most likely carcinoma. Currently being worked up for a left pulmonary nodule. She has elected to have radiation without or biopsy. For the tongue, the best treatment is going to be surgical which would involve a partial glossectomy and modified neck dissection. She seems amenable to this. I will present her at tumor Board next weeks as we can discuss the best way to proceed with these 2 potential malignancies. Reason For Visit  Brenda Gardner is here today at the kind request of Gery Pray for consultation and opinion for sore on tongue and earache. HPI  Long-time smoking history. Left pulmonary nodule increased in size over the past year, positive on PET scan. She was going to have radiation treatments for this without a biopsy. In the meantime she has developed a sore on the left side of her tongue over the past 3 weeks that is painful and causing referred otalgia on that side. It is also positive on PET scan. Allergies  Lisinopril TABS. Current Meds  Norvasc TABS (AmLODIPine Besylate);; RPT Toprol XL 25 MG Oral Tablet Extended Release 24 Hour (Metoprolol Succinate ER);; RPT. Active Problems  Hypertension   (401.9) (I10) Lung nodule   (793.11) (R91.1). PMH  History of malignant neoplasm of skin (V10.83) (Q65.784) History of vascular surgery (V45.89) (Z98.89). PSH  Appendectomy Cataract Surgery Oral Surgery Tooth Extraction Tonsillectomy Tubal Ligation. Family Hx  Family history of hearing loss: Mother (V19.2) (Z82.2) Family history of hypertension: Mother (V17.49) (Z82.49) Seasonal allergies: Mother (J30.2). Personal Hx  Alcohol use (V49.89) (F10.99); social >2 drinks day Caffeine use (V49.89) (Z78.9); 2 cups daily Current smoker (305.1) (Z72.0). ROS  Systemic: Feeling tired (fatigue).  No fever, no night sweats, and no recent  weight loss. Head: No headache. Eyes: No eye symptoms. Otolaryngeal: No hearing loss.  Earache.  No tinnitus  and no purulent nasal discharge.  No nasal passage blockage (stuffiness), no snoring, no sneezing, no hoarseness, and no sore throat. Cardiovascular: No chest pain or discomfort  and no palpitations. Pulmonary: No dyspnea, no cough, and no wheezing. Gastrointestinal: No dysphagia  and no heartburn.  No nausea, no abdominal pain, and no melena.  No diarrhea. Genitourinary: No dysuria. Endocrine: No muscle weakness. Musculoskeletal: No calf muscle cramps, no arthralgias, and no soft tissue swelling. Neurological: No dizziness, no fainting, no tingling, and no numbness. Psychological: No anxiety  and no depression. Skin: No rash. 12 system ROS was obtained and reviewed on the Health Maintenance form dated today.  Positive responses are shown above.  If the symptom is not checked, the patient has denied it. Vital Signs   Recorded by Skolimowski,Sharon on 10 Dec 2013 02:19 PM BP:148/68,  Height: 5 ft 6 in, Weight: 110 lb 8 oz, BMI: 17.8 kg/m2,  BMI Calculated: 17.84 ,  BSA Calculated: 1.55. Physical Exam  APPEARANCE: Well developed, thin lady, in no acute distress.  Normal affect, in a pleasant mood.  Oriented to time, place and person. COMMUNICATION: Normal voice   HEAD & FACE:  No scars, lesions or masses of head and face.  Sinuses nontender to palpation.  Salivary glands without mass or tenderness.  Facial strength symmetric.  No facial lesion, scars, or mass. EYES: EOMI with normal primary gaze alignment. Visual acuity grossly intact.  PERRLA EXTERNAL EAR & NOSE: No scars, lesions or masses  EAC & TYMPANIC MEMBRANE:  EAC shows no obstructing lesions or debris  and tympanic membranes are normal bilaterally with good movement to insufflation. GROSS HEARING: Normal   TMJ:  Nontender  INTRANASAL EXAM: No polyps or purulence.  NASOPHARYNX: Normal, without lesions. LIPS, TEETH & GUMS:  No lip lesions, normal dentition and normal gums. ORAL CAVITY/OROPHARYNX: 2-3 cm raised superficial lesion left lateral oral tongue. Floor of mouth is clear. It does not approach the midline. NECK:  Supple without adenopathy or mass. THYROID:  Normal with no masses palpable.  NEUROLOGIC:  No gross CN deficits. No nystagmus noted.   LYMPHATIC:  No enlarged nodes palpable, although the exam is limited due to her excessive tickleshness. Signature  Electronically signed by : [name]  M.D.; 12/10/2013 2:35 PM EST.

## 2013-12-30 ENCOUNTER — Encounter (HOSPITAL_COMMUNITY)
Admission: RE | Admit: 2013-12-30 | Discharge: 2013-12-30 | Disposition: A | Discharge status: Home / Self Care | Payer: Medicare Other | Source: Ambulatory Visit | Attending: Otolaryngology | Admitting: Otolaryngology

## 2013-12-30 ENCOUNTER — Encounter (HOSPITAL_COMMUNITY): Payer: Self-pay

## 2013-12-30 DIAGNOSIS — Z01818 Encounter for other preprocedural examination: Secondary | ICD-10-CM | POA: Insufficient documentation

## 2013-12-30 DIAGNOSIS — Z01812 Encounter for preprocedural laboratory examination: Secondary | ICD-10-CM | POA: Insufficient documentation

## 2013-12-30 HISTORY — DX: Anxiety disorder, unspecified: F41.9

## 2013-12-30 HISTORY — DX: Unspecified osteoarthritis, unspecified site: M19.90

## 2013-12-30 HISTORY — DX: Other complications of anesthesia, initial encounter: T88.59XA

## 2013-12-30 HISTORY — DX: Malignant (primary) neoplasm, unspecified: C80.1

## 2013-12-30 HISTORY — DX: Adverse effect of unspecified anesthetic, initial encounter: T41.45XA

## 2013-12-30 HISTORY — DX: Nausea with vomiting, unspecified: R11.2

## 2013-12-30 HISTORY — DX: Solitary pulmonary nodule: R91.1

## 2013-12-30 HISTORY — DX: Other specified postprocedural states: Z98.890

## 2013-12-30 LAB — COMPREHENSIVE METABOLIC PANEL
ALBUMIN: 3.7 g/dL (ref 3.5–5.2)
ALK PHOS: 101 U/L (ref 39–117)
ALT: 14 U/L (ref 0–35)
AST: 40 U/L — ABNORMAL HIGH (ref 0–37)
BUN: 11 mg/dL (ref 6–23)
CO2: 26 mEq/L (ref 19–32)
Calcium: 9.5 mg/dL (ref 8.4–10.5)
Chloride: 99 mEq/L (ref 96–112)
Creatinine, Ser: 0.71 mg/dL (ref 0.50–1.10)
GFR calc Af Amer: >90 mL/min (ref >90)
GFR calc non Af Amer: 80 mL/min — ABNORMAL LOW (ref >90)
GLUCOSE: 118 mg/dL — AB (ref 70–99)
POTASSIUM: 4.4 meq/L (ref 3.7–5.3)
SODIUM: 140 meq/L (ref 137–147)
Total Bilirubin: 0.5 mg/dL (ref 0.3–1.2)
Total Protein: 7.6 g/dL (ref 6.0–8.3)

## 2013-12-30 LAB — CBC
HCT: 44.1 % (ref 36.0–46.0)
Hemoglobin: 14.7 g/dL (ref 12.0–15.0)
MCH: 32.1 pg (ref 26.0–34.0)
MCHC: 33.3 g/dL (ref 30.0–36.0)
MCV: 96.3 fL (ref 78.0–100.0)
Platelets: 163 10*3/uL (ref 150–400)
RBC: 4.58 MIL/uL (ref 3.87–5.11)
RDW: 13.7 % (ref 11.5–15.5)
WBC: 5.4 10*3/uL (ref 4.0–10.5)

## 2013-12-30 LAB — PROTIME-INR
INR: 1 (ref 0.00–1.49)
Prothrombin Time: 13 seconds (ref 11.6–15.2)

## 2013-12-30 LAB — NO BLOOD PRODUCTS

## 2013-12-30 NOTE — Progress Notes (Addendum)
Spoke with Sigmund Hazel, PA concerning Ms. Felber's ekg.  I have called [facility], they have no one by her DOB in their system.  I also called another facility--urgent care...they too, have no EKG on this patient.  DA

## 2013-12-30 NOTE — Pre-Procedure Instructions (Signed)
Trinda IRELAND VIRRUETA  12/30/2013   Your procedure is scheduled on:  Friday, May 8th   Report to [facility] Admitting at  9:30 AM.             [facility] Parking is available)  Call this number if you have problems the morning of surgery: [phone]   Remember:   Do not eat food or drink liquids after midnight Thursday.    Take these medicines the morning of surgery with A SIP OF WATER: Norvasc, Metoprolol   Do not wear jewelry, make-up or nail polish.  Do not wear lotions, powders, or perfumes. You may NOT wear deodorant.  Do not shave underarms & legs 48 hours prior to surgery.    Do not bring valuables to the hospital.  [facility] is not responsible for any belongings or valuables.               Contacts, dentures or bridgework may not be worn into surgery.  Leave suitcase in the car. After surgery it may be brought to your room.  For patients admitted to the hospital, discharge time is determined by your treatment team.    Name and phone number of your driver: "Preparing for Surgery" instruction sheet.   Special Instructions:    Please read over the following fact sheets that you were given: Pain Booklet and Surgical Site Infection Prevention

## 2013-12-31 ENCOUNTER — Encounter (HOSPITAL_COMMUNITY): Payer: Self-pay

## 2013-12-31 NOTE — Progress Notes (Signed)
Anesthesia Chart Review:  Patient is a 80 year old female scheduled for partial glossectomy, left neck dissection with frozen section on 01/07/14 by Dr. Constance Holster. She has a left oral tongue lesion, felt most likely carcinoma (hypermetabolic on PET scan).    History includes smoking, HTN, hereditary hemochromatosis (Dr. Concha Norway; receives phlebotomy to maintain ferritin < 50; last visit 12/13/13; has evidence of hepatic fibrosis/scarring by 2013 MRI), anxiety, arthritis, tonsillectomy appendectomy, remote history of post-operative N/V, left lung lesion with hypermetabolic PET scan (saw Dr. Modesto Charon and refused surgery or biopsy, only radiation), varicose vein surgery.  BMI is 17. She did not report a regular PCP, but has seen Roe Coombs, PA-C and Dr. Rosana Hoes Cloward in the past--she considers Dr. Juliann Mule as her PCP because she seems him so often for her hemochromatosis. Notes from Dr. Roxan Hockey indicate that she has a fear of anesthesia, but had declined preoperative anesthesia consultation in the past.  EKG on 12/30/13 showed NSR, LAFB, possible anterior infarct (age undetermined). Currently, there are no comparison EKGs available.  I was given EKG to review after patient had already left her PAT visit. No chest pain symptoms documented at her PAT visit.  In 10/2013, it was documented that "She is not very physically active. She does get short of breath with walking up one flight of stairs. She seldom walks up any incline. She denies any chest pain, pressure, or tightness."   Chest CT on 10/08/13 showed:  1. Further increase in size of left apical nodule which is concerning for primary neoplasm of the lung.  2. Calcified atherosclerotic disease including multi vessel coronary artery calcifications involving the RCA, LAD and LCX.  Preoperative labs noted.  AST mildly elevated at 40.  ALT, PT/INR, CBC WNL. She has refused any blood transfusion.    Above reviewed including recent EKG and chest CT  report with anesthesiologist Dr.Frederick.  If patient is asymptomatic from a CV standpoint then anticipate that she can proceed as planned.  I called and spoke with patient.  She continues to deny chest pain, SOB at rest.  She denies hemoptysis.  She is not particularly active but can go up a flight of stair and do house cleaning such as vacuuming. She does have questions about lymphedema risks with neck dissection, and I encouraged her to contact Dr. Janeice Robinson office to address this further.  She will be further evaluated by her assigned anesthesiologist on the day of surgery to discuss the definitive anesthesia plan.   George Hugh [facility] Short Stay Center/Anesthesiology Phone [phone] 12/31/2013 4:57 PM

## 2014-01-05 ENCOUNTER — Other Ambulatory Visit: Payer: Self-pay | Admitting: Medical Oncology

## 2014-01-05 MED ORDER — LORAZEPAM 0.5 MG PO TABS: 0.5000 mg | ORAL_TABLET | Freq: Four times a day (QID) | ORAL | Status: DC | PRN

## 2014-01-06 ENCOUNTER — Telehealth: Payer: Self-pay | Admitting: *Deleted

## 2014-01-06 ENCOUNTER — Encounter: Payer: Self-pay | Admitting: Internal Medicine

## 2014-01-06 MED ORDER — CEFAZOLIN SODIUM-DEXTROSE 2-3 GM-% IV SOLR
2.0000 g | INTRAVENOUS | Status: AC
  Administered 2014-01-07: 2 g via INTRAVENOUS

## 2014-01-06 NOTE — Telephone Encounter (Signed)
Received request for prior authorization from [employer] for Lorazepam.  Gave request to care management.

## 2014-01-06 NOTE — Progress Notes (Signed)
Faxed lorazepam pa form to [employer] °

## 2014-01-07 ENCOUNTER — Encounter (HOSPITAL_COMMUNITY): Payer: Self-pay | Admitting: Anesthesiology

## 2014-01-07 ENCOUNTER — Inpatient Hospital Stay (HOSPITAL_COMMUNITY): Payer: Medicare Other

## 2014-01-07 ENCOUNTER — Encounter (HOSPITAL_COMMUNITY): Payer: Medicare Other | Admitting: Vascular Surgery

## 2014-01-07 ENCOUNTER — Inpatient Hospital Stay (HOSPITAL_COMMUNITY)
Admission: RE | Admit: 2014-01-07 | Discharge: 2014-01-09 | DRG: 129 | Disposition: A | Discharge status: Home / Self Care | Payer: Medicare Other | Source: Ambulatory Visit | Attending: Otolaryngology | Admitting: Otolaryngology

## 2014-01-07 ENCOUNTER — Encounter (HOSPITAL_COMMUNITY)
Admission: RE | Disposition: A | Discharge status: Home / Self Care | Payer: Self-pay | Source: Ambulatory Visit | Attending: Otolaryngology

## 2014-01-07 ENCOUNTER — Inpatient Hospital Stay (HOSPITAL_COMMUNITY): Payer: Medicare Other | Admitting: Anesthesiology

## 2014-01-07 DIAGNOSIS — E43 Unspecified severe protein-calorie malnutrition: Secondary | ICD-10-CM | POA: Diagnosis present

## 2014-01-07 DIAGNOSIS — R911 Solitary pulmonary nodule: Secondary | ICD-10-CM | POA: Diagnosis present

## 2014-01-07 DIAGNOSIS — Z681 Body mass index (BMI) 19 or less, adult: Secondary | ICD-10-CM

## 2014-01-07 DIAGNOSIS — C029 Malignant neoplasm of tongue, unspecified: Principal | ICD-10-CM

## 2014-01-07 DIAGNOSIS — F17200 Nicotine dependence, unspecified, uncomplicated: Secondary | ICD-10-CM | POA: Diagnosis present

## 2014-01-07 DIAGNOSIS — Z85828 Personal history of other malignant neoplasm of skin: Secondary | ICD-10-CM

## 2014-01-07 DIAGNOSIS — Z8249 Family history of ischemic heart disease and other diseases of the circulatory system: Secondary | ICD-10-CM

## 2014-01-07 DIAGNOSIS — I10 Essential (primary) hypertension: Secondary | ICD-10-CM | POA: Diagnosis present

## 2014-01-07 HISTORY — PX: RADICAL NECK DISSECTION: SHX2284

## 2014-01-07 HISTORY — PX: GLOSSECTOMY: SHX5200

## 2014-01-07 LAB — MRSA PCR SCREENING: MRSA by PCR: NEGATIVE

## 2014-01-07 SURGERY — GLOSSECTOMY
Anesthesia: General | Laterality: Left

## 2014-01-07 MED ORDER — DEXAMETHASONE SODIUM PHOSPHATE 4 MG/ML IJ SOLN
INTRAMUSCULAR | Status: AC
  Filled 2014-01-07: qty 1

## 2014-01-07 MED ORDER — HYDROMORPHONE HCL PF 1 MG/ML IJ SOLN
INTRAMUSCULAR | Status: AC
  Administered 2014-01-07: 0.5 mg via INTRAVENOUS
  Filled 2014-01-07: qty 1

## 2014-01-07 MED ORDER — PHENYLEPHRINE HCL 10 MG/ML IJ SOLN
10.0000 mg | INTRAMUSCULAR | Status: DC | PRN
  Administered 2014-01-07: 50 ug/min via INTRAVENOUS

## 2014-01-07 MED ORDER — FENTANYL CITRATE 0.05 MG/ML IJ SOLN
INTRAMUSCULAR | Status: AC
  Filled 2014-01-07: qty 5

## 2014-01-07 MED ORDER — ONDANSETRON HCL 4 MG/2ML IJ SOLN
INTRAMUSCULAR | Status: AC
  Filled 2014-01-07: qty 2

## 2014-01-07 MED ORDER — DEXAMETHASONE SODIUM PHOSPHATE 4 MG/ML IJ SOLN
INTRAMUSCULAR | Status: DC | PRN
  Administered 2014-01-07: 4 mg via INTRAVENOUS

## 2014-01-07 MED ORDER — NEOSTIGMINE METHYLSULFATE 10 MG/10ML IV SOLN
INTRAVENOUS | Status: DC | PRN
  Administered 2014-01-07: 3 mg via INTRAVENOUS

## 2014-01-07 MED ORDER — BACITRACIN ZINC 500 UNIT/GM EX OINT
TOPICAL_OINTMENT | CUTANEOUS | Status: AC
  Filled 2014-01-07: qty 15

## 2014-01-07 MED ORDER — GLYCOPYRROLATE 0.2 MG/ML IJ SOLN
INTRAMUSCULAR | Status: DC | PRN
  Administered 2014-01-07: 0.4 mg via INTRAVENOUS

## 2014-01-07 MED ORDER — LACTATED RINGERS IV SOLN
INTRAVENOUS | Status: DC
  Administered 2014-01-07: 10:00:00 via INTRAVENOUS

## 2014-01-07 MED ORDER — ROCURONIUM BROMIDE 50 MG/5ML IV SOLN
INTRAVENOUS | Status: AC
  Filled 2014-01-07: qty 1

## 2014-01-07 MED ORDER — ARTIFICIAL TEARS OP OINT
TOPICAL_OINTMENT | OPHTHALMIC | Status: DC | PRN
  Administered 2014-01-07: 1 via OPHTHALMIC

## 2014-01-07 MED ORDER — FENTANYL CITRATE 0.05 MG/ML IJ SOLN
INTRAMUSCULAR | Status: DC | PRN
  Administered 2014-01-07: 25 ug via INTRAVENOUS
  Administered 2014-01-07 (×3): 50 ug via INTRAVENOUS
  Administered 2014-01-07: 25 ug via INTRAVENOUS
  Administered 2014-01-07: 50 ug via INTRAVENOUS
  Administered 2014-01-07: 25 ug via INTRAVENOUS
  Administered 2014-01-07 (×2): 50 ug via INTRAVENOUS
  Administered 2014-01-07: 25 ug via INTRAVENOUS
  Administered 2014-01-07 (×2): 50 ug via INTRAVENOUS

## 2014-01-07 MED ORDER — AMLODIPINE BESYLATE 2.5 MG PO TABS
2.5000 mg | ORAL_TABLET | Freq: Every day | ORAL | Status: DC
  Administered 2014-01-09: 2.5 mg via ORAL
  Filled 2014-01-07 (×2): qty 1

## 2014-01-07 MED ORDER — LIDOCAINE HCL (CARDIAC) 20 MG/ML IV SOLN
INTRAVENOUS | Status: AC
  Filled 2014-01-07: qty 5

## 2014-01-07 MED ORDER — LORAZEPAM 0.5 MG PO TABS
0.5000 mg | ORAL_TABLET | Freq: Four times a day (QID) | ORAL | Status: DC | PRN
  Administered 2014-01-08: 0.5 mg via ORAL
  Filled 2014-01-07: qty 1

## 2014-01-07 MED ORDER — ROCURONIUM BROMIDE 100 MG/10ML IV SOLN
INTRAVENOUS | Status: DC | PRN
Start: 2014-01-07 — End: 2014-01-07
  Administered 2014-01-07: 50 mg via INTRAVENOUS

## 2014-01-07 MED ORDER — GLYCOPYRROLATE 0.2 MG/ML IJ SOLN
INTRAMUSCULAR | Status: AC
  Filled 2014-01-07: qty 3

## 2014-01-07 MED ORDER — METOPROLOL SUCCINATE ER 25 MG PO TB24
25.0000 mg | ORAL_TABLET | Freq: Every day | ORAL | Status: DC
  Administered 2014-01-09: 25 mg via ORAL
  Filled 2014-01-07 (×2): qty 1

## 2014-01-07 MED ORDER — PROPOFOL 10 MG/ML IV BOLUS
INTRAVENOUS | Status: DC | PRN
  Administered 2014-01-07: 160 mg via INTRAVENOUS

## 2014-01-07 MED ORDER — BACITRACIN ZINC 500 UNIT/GM EX OINT
1.0000 "application " | TOPICAL_OINTMENT | Freq: Three times a day (TID) | CUTANEOUS | Status: DC
  Administered 2014-01-07 – 2014-01-09 (×6): 1 via TOPICAL
  Filled 2014-01-07: qty 28.35

## 2014-01-07 MED ORDER — ONE-DAILY MULTI VITAMINS PO TABS: 1.0000 | ORAL_TABLET | Freq: Every day | ORAL | Status: DC

## 2014-01-07 MED ORDER — HYDROMORPHONE HCL PF 1 MG/ML IJ SOLN
0.2500 mg | INTRAMUSCULAR | Status: DC | PRN
  Administered 2014-01-07 (×2): 0.5 mg via INTRAVENOUS

## 2014-01-07 MED ORDER — OXYCODONE HCL 5 MG PO TABS: 5.0000 mg | ORAL_TABLET | Freq: Once | ORAL | Status: DC | PRN

## 2014-01-07 MED ORDER — CEFAZOLIN SODIUM-DEXTROSE 2-3 GM-% IV SOLR
INTRAVENOUS | Status: AC
  Filled 2014-01-07: qty 50

## 2014-01-07 MED ORDER — ARTIFICIAL TEARS OP OINT
TOPICAL_OINTMENT | OPHTHALMIC | Status: AC
  Filled 2014-01-07: qty 3.5

## 2014-01-07 MED ORDER — 0.9 % SODIUM CHLORIDE (POUR BTL) OPTIME
TOPICAL | Status: DC | PRN
  Administered 2014-01-07: 1000 mL

## 2014-01-07 MED ORDER — ONDANSETRON HCL 4 MG/2ML IJ SOLN
INTRAMUSCULAR | Status: DC | PRN
  Administered 2014-01-07: 4 mg via INTRAVENOUS

## 2014-01-07 MED ORDER — OXYCODONE HCL 5 MG/5ML PO SOLN: 5.0000 mg | Freq: Once | ORAL | Status: DC | PRN

## 2014-01-07 MED ORDER — HYDROCODONE-ACETAMINOPHEN 7.5-325 MG/15ML PO SOLN
10.0000 mL | ORAL | Status: DC | PRN
Start: 2014-01-07 — End: 2014-01-09
  Administered 2014-01-09: 15 mL via ORAL
  Filled 2014-01-07: qty 15

## 2014-01-07 MED ORDER — BACITRACIN ZINC 500 UNIT/GM EX OINT
TOPICAL_OINTMENT | CUTANEOUS | Status: DC | PRN
  Administered 2014-01-07: 1 via TOPICAL

## 2014-01-07 MED ORDER — LACTATED RINGERS IV SOLN
INTRAVENOUS | Status: DC | PRN
  Administered 2014-01-07 (×2): via INTRAVENOUS

## 2014-01-07 MED ORDER — METOCLOPRAMIDE HCL 5 MG/ML IJ SOLN: 10.0000 mg | Freq: Once | INTRAMUSCULAR | Status: DC | PRN

## 2014-01-07 MED ORDER — PROMETHAZINE HCL 25 MG PO TABS: 25.0000 mg | ORAL_TABLET | Freq: Four times a day (QID) | ORAL | Status: DC | PRN

## 2014-01-07 MED ORDER — METOCLOPRAMIDE HCL 5 MG/ML IJ SOLN
INTRAMUSCULAR | Status: DC | PRN
  Administered 2014-01-07: 10 mg via INTRAVENOUS

## 2014-01-07 MED ORDER — METOCLOPRAMIDE HCL 5 MG/ML IJ SOLN
INTRAMUSCULAR | Status: AC
  Filled 2014-01-07: qty 2

## 2014-01-07 MED ORDER — PROMETHAZINE HCL 25 MG RE SUPP: 25.0000 mg | Freq: Four times a day (QID) | RECTAL | Status: DC | PRN

## 2014-01-07 MED ORDER — NEOSTIGMINE METHYLSULFATE 10 MG/10ML IV SOLN
INTRAVENOUS | Status: AC
  Filled 2014-01-07: qty 1

## 2014-01-07 MED ORDER — DEXTROSE-NACL 5-0.9 % IV SOLN
INTRAVENOUS | Status: DC
  Administered 2014-01-07: 75 mL/h via INTRAVENOUS
  Administered 2014-01-08: 1000 mL via INTRAVENOUS
  Administered 2014-01-08: 22:00:00 via INTRAVENOUS

## 2014-01-07 MED ORDER — PHENYLEPHRINE HCL 10 MG/ML IJ SOLN
INTRAMUSCULAR | Status: DC | PRN
  Administered 2014-01-07: 120 ug via INTRAVENOUS

## 2014-01-07 MED ORDER — LIDOCAINE HCL (CARDIAC) 20 MG/ML IV SOLN
INTRAVENOUS | Status: DC | PRN
  Administered 2014-01-07: 40 mg via INTRAVENOUS

## 2014-01-07 MED ORDER — CLINDAMYCIN PHOSPHATE 300 MG/50ML IV SOLN
300.0000 mg | Freq: Three times a day (TID) | INTRAVENOUS | Status: AC
  Administered 2014-01-07 – 2014-01-08 (×4): 300 mg via INTRAVENOUS
  Filled 2014-01-07 (×5): qty 50

## 2014-01-07 SURGICAL SUPPLY — 72 items
APPLIER CLIP 9.375 MED OPEN (MISCELLANEOUS) ×3
APPLIER CLIP 9.375 SM OPEN (CLIP)
ATTRACTOMAT 16X20 MAGNETIC DRP (DRAPES) IMPLANT
BENZOIN TINCTURE PRP APPL 2/3 (GAUZE/BANDAGES/DRESSINGS) ×3 IMPLANT
BLADE 10 SAFETY STRL DISP (BLADE) ×3 IMPLANT
BLADE 15 SAFETY STRL DISP (BLADE) ×3 IMPLANT
BLADE SURG 15 STRL LF DISP TIS (BLADE) ×1 IMPLANT
BLADE SURG 15 STRL SS (BLADE) ×2
CANISTER SUCTION 2500CC (MISCELLANEOUS) ×3 IMPLANT
CLEANER TIP ELECTROSURG 2X2 (MISCELLANEOUS) ×6 IMPLANT
CLIP APPLIE 9.375 MED OPEN (MISCELLANEOUS) ×1 IMPLANT
CLIP APPLIE 9.375 SM OPEN (CLIP) IMPLANT
CONT SPEC 4OZ CLIKSEAL STRL BL (MISCELLANEOUS) ×15 IMPLANT
CORDS BIPOLAR (ELECTRODE) ×3 IMPLANT
COVER SURGICAL LIGHT HANDLE (MISCELLANEOUS) ×6 IMPLANT
COVER TABLE BACK 60X90 (DRAPES) IMPLANT
DRAIN CHANNEL 15F RND FF W/TCR (WOUND CARE) ×3 IMPLANT
DRAPE INCISE 23X17 IOBAN STRL (DRAPES)
DRAPE INCISE IOBAN 23X17 STRL (DRAPES) IMPLANT
DRAPE PROXIMA HALF (DRAPES) ×6 IMPLANT
ELECT COATED BLADE 2.86 ST (ELECTRODE) ×6 IMPLANT
ELECT REM PT RETURN 9FT ADLT (ELECTROSURGICAL) ×3
ELECTRODE REM PT RTRN 9FT ADLT (ELECTROSURGICAL) ×1 IMPLANT
EVACUATOR SILICONE 100CC (DRAIN) ×3 IMPLANT
FORCEPS BIPOLAR SPETZLER 8 1.0 (NEUROSURGERY SUPPLIES) ×3 IMPLANT
GAUZE SPONGE 4X4 16PLY XRAY LF (GAUZE/BANDAGES/DRESSINGS) ×12 IMPLANT
GLOVE BIOGEL PI IND STRL 6.5 (GLOVE) ×9 IMPLANT
GLOVE BIOGEL PI IND STRL 7.5 (GLOVE) ×1 IMPLANT
GLOVE BIOGEL PI INDICATOR 6.5 (GLOVE) ×18
GLOVE BIOGEL PI INDICATOR 7.5 (GLOVE) ×2
GLOVE ECLIPSE 6.5 STRL STRAW (GLOVE) ×18 IMPLANT
GLOVE ECLIPSE 7.5 STRL STRAW (GLOVE) ×9 IMPLANT
GLOVE SURG SS PI 6.5 STRL IVOR (GLOVE) ×3 IMPLANT
GOWN STRL REUS W/ TWL LRG LVL3 (GOWN DISPOSABLE) ×6 IMPLANT
GOWN STRL REUS W/TWL LRG LVL3 (GOWN DISPOSABLE) ×12
KANGAROO NG FEEDING TUBE ×3 IMPLANT
KIT BASIN OR (CUSTOM PROCEDURE TRAY) ×3 IMPLANT
KIT ROOM TURNOVER OR (KITS) ×3 IMPLANT
LOCATOR NERVE 3 VOLT (DISPOSABLE) IMPLANT
NEEDLE HYPO 25GX1X1/2 BEV (NEEDLE) ×3 IMPLANT
NS IRRIG 1000ML POUR BTL (IV SOLUTION) ×3 IMPLANT
PAD ARMBOARD 7.5X6 YLW CONV (MISCELLANEOUS) ×6 IMPLANT
PENCIL FOOT CONTROL (ELECTRODE) ×6 IMPLANT
SPECIMEN JAR MEDIUM (MISCELLANEOUS) ×3 IMPLANT
SPONGE GAUZE 4X4 12PLY (GAUZE/BANDAGES/DRESSINGS) IMPLANT
SPONGE INTESTINAL PEANUT (DISPOSABLE) IMPLANT
SPONGE LAP 18X18 X RAY DECT (DISPOSABLE) ×3 IMPLANT
STAPLER VISISTAT 35W (STAPLE) ×3 IMPLANT
SURGILUBE 2OZ TUBE FLIPTOP (MISCELLANEOUS) IMPLANT
SUT CHROMIC 3 0 PS 2 (SUTURE) ×9 IMPLANT
SUT CHROMIC 3 0 SH 27 (SUTURE) ×3 IMPLANT
SUT CHROMIC 5 0 P 3 (SUTURE) IMPLANT
SUT ETHILON 3 0 PS 1 (SUTURE) ×3 IMPLANT
SUT ETHILON 5 0 PS 2 18 (SUTURE) IMPLANT
SUT SILK 2 0 FS (SUTURE) ×3 IMPLANT
SUT SILK 2 0 REEL (SUTURE) IMPLANT
SUT SILK 3 0 REEL (SUTURE) ×3 IMPLANT
SUT SILK 3 0 SH CR/8 (SUTURE) ×3 IMPLANT
SUT SILK 4 0 REEL (SUTURE) ×6 IMPLANT
SUT VIC AB 3-0 SH 18 (SUTURE) IMPLANT
SUT VIC AB 3-0 SH 27 (SUTURE) ×6
SUT VIC AB 3-0 SH 27XBRD (SUTURE) ×3 IMPLANT
SUT VIC AB 4-0 RB1 18 (SUTURE) IMPLANT
SYR BULB 3OZ (MISCELLANEOUS) ×3 IMPLANT
TOWEL OR 17X24 6PK STRL BLUE (TOWEL DISPOSABLE) ×3 IMPLANT
TOWEL OR 17X26 10 PK STRL BLUE (TOWEL DISPOSABLE) ×3 IMPLANT
TRAY ENT MC OR (CUSTOM PROCEDURE TRAY) ×3 IMPLANT
TRAY FOLEY CATH 14FRSI W/METER (CATHETERS) ×3 IMPLANT
TUBE CONNECTING 12'X1/4 (SUCTIONS) ×1
TUBE CONNECTING 12X1/4 (SUCTIONS) ×2 IMPLANT
UNDERPAD 30X30 INCONTINENT (UNDERPADS AND DIAPERS) ×3 IMPLANT
WATER STERILE IRR 1000ML POUR (IV SOLUTION) ×3 IMPLANT

## 2014-01-07 NOTE — Progress Notes (Signed)
Postop check.  No complaints.  No trouble breathing. Tongue is mobile. No signs of swelling or hematoma. Suture line intact. Neck looks excellent. No swelling or hematoma. JP is functioning.  Stable postop. Continue care in step down overnight and transferred to the floor tomorrow. Feeding tube was removed as it was curled in the esophagus. We will try oral liquids tonight.

## 2014-01-07 NOTE — Anesthesia Preprocedure Evaluation (Signed)
Anesthesia Evaluation  Patient identified by MRN, date of birth, ID band Patient awake    Reviewed: Allergy & Precautions, H&P , NPO status , Patient's Chart, lab work & pertinent test results, reviewed documented beta blocker date and time   History of Anesthesia Complications (+) PONV and history of anesthetic complications  Airway Mallampati: II TM Distance: >3 FB Neck ROM: full    Dental   Pulmonary Current Smoker,  breath sounds clear to auscultation        Cardiovascular hypertension, On Medications and On Home Beta Blockers Rhythm:regular     Neuro/Psych negative neurological ROS  negative psych ROS   GI/Hepatic negative GI ROS, Neg liver ROS,   Endo/Other  negative endocrine ROS  Renal/GU negative Renal ROS  negative genitourinary   Musculoskeletal   Abdominal   Peds  Hematology  (+) Blood dyscrasia, ,   Anesthesia Other Findings See surgeon's H&P   Reproductive/Obstetrics negative OB ROS                           Anesthesia Physical Anesthesia Plan  ASA: II  Anesthesia Plan: General   Post-op Pain Management:    Induction: Intravenous  Airway Management Planned: Oral ETT  Additional Equipment:   Intra-op Plan:   Post-operative Plan: Extubation in OR  Informed Consent: I have reviewed the patients History and Physical, chart, labs and discussed the procedure including the risks, benefits and alternatives for the proposed anesthesia with the patient or authorized representative who has indicated his/her understanding and acceptance.   Dental Advisory Given  Plan Discussed with: CRNA and Surgeon  Anesthesia Plan Comments:         Anesthesia Quick Evaluation

## 2014-01-07 NOTE — Interval H&P Note (Signed)
History and Physical Interval Note:  01/07/2014 10:20 AM  [name] [name]  has presented today for surgery, with the diagnosis of Neoplasm of unspecified nature of digestive system  The various methods of treatment have been discussed with the patient and family. After consideration of risks, benefits and other options for treatment, the patient has consented to  Procedure(s): PARTIAL GLOSSECTOMY (N/A) LEFT  NECK DISSECTION WITH FROZEN SECTION (Left) as a surgical intervention .  The patient's history has been reviewed, patient examined, no change in status, stable for surgery.  I have reviewed the patient's chart and labs.  Questions were answered to the patient's satisfaction.     Brenda Gardner

## 2014-01-07 NOTE — Anesthesia Postprocedure Evaluation (Signed)
Anesthesia Post Note  Patient: Brenda Gardner  Procedure(s) Performed: Procedure(s) (LRB): PARTIAL GLOSSECTOMY (Left) LEFT  NECK DISSECTION WITH FROZEN SECTION (Left)  Anesthesia type: General  Patient location: PACU  Post pain: Pain level controlled  Post assessment: Patient's Cardiovascular Status Stable  Last Vitals:  Filed Vitals:   01/07/14 1415  BP: 180/85  Pulse: 72  Temp:   Resp: 14    Post vital signs: Reviewed and stable  Level of consciousness: alert  Complications: No apparent anesthesia complications

## 2014-01-07 NOTE — Progress Notes (Signed)
X-ray report called to Dr. Constance Holster.

## 2014-01-07 NOTE — Progress Notes (Signed)
Dr Albertina Parr informed of Bp readings by Sharyn Lull, RN

## 2014-01-07 NOTE — Transfer of Care (Signed)
Immediate Anesthesia Transfer of Care Note  Patient: Brenda Gardner  Procedure(s) Performed: Procedure(s): PARTIAL GLOSSECTOMY (Left) LEFT  NECK DISSECTION WITH FROZEN SECTION (Left)  Patient Location: PACU  Anesthesia Type:General  Level of Consciousness: awake, alert  and oriented  Airway & Oxygen Therapy: Patient Spontanous Breathing and Patient connected to nasal cannula oxygen  Post-op Assessment: Report given to PACU RN and Post -op Vital signs reviewed and stable  Post vital signs: Reviewed and stable  Complications: No apparent anesthesia complications

## 2014-01-07 NOTE — Progress Notes (Signed)
Pt came here from surgery. Pt is alert and oriented x 4. Pt daughter here. Pt states no pain at this time. Called central telemetry and notified. Pt has within normal vitals. Pt has no requests at this time.

## 2014-01-07 NOTE — Anesthesia Procedure Notes (Signed)
Procedure Name: Intubation Date/Time: 01/07/2014 10:40 AM Performed by: Erik Obey Pre-anesthesia Checklist: Patient identified, Patient being monitored, Emergency Drugs available, Timeout performed and Suction available Patient Re-evaluated:Patient Re-evaluated prior to inductionOxygen Delivery Method: Circle system utilized Preoxygenation: Pre-oxygenation with 100% oxygen Intubation Type: IV induction Ventilation: Mask ventilation without difficulty Laryngoscope Size: Mac and 3 Grade View: Grade I Tube type: Oral Tube size: 7.0 mm Number of attempts: 1 Airway Equipment and Method: Stylet Placement Confirmation: ETT inserted through vocal cords under direct vision,  positive ETCO2 and breath sounds checked- equal and bilateral Secured at: 20 cm Tube secured with: Tape Dental Injury: Teeth and Oropharynx as per pre-operative assessment

## 2014-01-07 NOTE — Progress Notes (Signed)
NG pulled per Dr. Janeice Robinson verbal order

## 2014-01-07 NOTE — Op Note (Signed)
OPERATIVE REPORT  DATE OF SURGERY: 01/07/2014  PATIENT:  Brenda Gardner,  80 y.o. female  PRE-OPERATIVE DIAGNOSIS:  Neoplasm of unspecified nature of digestive system  POST-OPERATIVE DIAGNOSIS:  Squamous cell carcinoma of the tongue  PROCEDURE:  Procedure(s): PARTIAL GLOSSECTOMY LEFT  NECK DISSECTION WITH FROZEN SECTION  SURGEON:  Beckie Salts, MD  ASSISTANTS: Jolene Provost, PA  ANESTHESIA:   General   EBL:  50 ml  DRAINS: 15 French round  LOCAL MEDICATIONS USED:  None  SPECIMEN:  Biopsy left total, frozen section, consistent with squamous cell carcinoma. Partial glossectomy, margins negative on frozen section. Right supraomohyoid neck dissection with additional tissue from level IV.  COUNTS:  Correct  PROCEDURE DETAILS: The patient was taken to the operating room and placed on the operating table in the supine position. Following induction of general endotracheal anesthesia a small fragment of the tongue lesion was removed and sent for pathologic evaluation with frozen section. This was consistent with invasive squamous carcinoma. Patient was then prepped and draped in the standard fashion for oral cavity surgery. A towel clip was used in the center of the anterior tongue for retraction. Push arteries used to perform the partial glossectomy taking place 1 cm soft tissue margins of mucosa and along the deep musculature. The resection was marked with suture and sent for frozen section evaluation. All margins were clear. Interrupted 3-0 Vicryl suture was used to reapproximate the defect. Independent feeding tube was placed in the nasal cavity into the stomach, secured in place with tape.  Left supraomohyoid neck dissection. The neck was Re prepped and draped in a sterile fashion. A hemi-apron incision was outlined with a marking pen.cautery used to incise the skin subcutaneous tissue and through the platysmal layer. Subplatysmal flaps were developed superiorly up to the mandible and  inferiorly towards the lower part of the neck. Stay sutures were used throughout the case. The marginal branch of the  facial nerve was identified and dissected clear surrounding tissue and reflected superiorly.the facial vessels were identified ligated between clamps and divided. A 4-0 silk ties were used throughout the case. The submandibular gland was brought inferiorly. The submental fat pad was removed off of the digastric muscles. The mylohyoid muscle was reflected anteriorly. The lingual nerve was identified through the ganglion was sacrificed as was the duct. The glandwas brought inferiorly off of the digastric muscle. The facial artery was ligated between clamps and divided.the fascia was then dissected off of the sternocleidomastoid muscle and brought forward. The spinal accessory nerve was identified and preserved. The fibrofatty tissue posterior and superior to the nerve was dissected free off of the levator scapula and splenius capitis muscles. This was then brought under the nerve and brought forward.dissection continued inferiorly in the posterior aspect of the jugular chain, fibrofatty tissue was brought forward off of the cervical cutaneous branches. This was carried out only to the omohyoid muscle which was reflected inferiorly exposing the inferior aspect of the dissection. The carotid and vagus were identified and preserved then cleaned of surrounding tissue. The specimen was then carefully brought off the internal jugular vein using sharp dissection. Jugular branches were ligated between clamps and divided. The specimen was removed. There were some additional lymph node-appearing tissue in levels 4 and this area was separately dissected. A lymphatic duct was ligated in this area. The wounds are used saline. There is no evidence of bleeding or chyle leak. The drain was placed through separate stab incision and secured in place with a  nylon suture. The platysmal layer was reapproximated with  chromic suture. Skin staples were used on the skin. Patient was awakened extubated and transferred to recovery in stable condition.    PATIENT DISPOSITION:  To PACU, stable

## 2014-01-08 MED ORDER — ENSURE COMPLETE PO LIQD
237.0000 mL | Freq: Three times a day (TID) | ORAL | Status: DC
  Administered 2014-01-08: 237 mL via ORAL

## 2014-01-08 MED ORDER — BOOST / RESOURCE BREEZE PO LIQD
1.0000 | Freq: Three times a day (TID) | ORAL | Status: DC
  Administered 2014-01-08: 1 via ORAL

## 2014-01-08 NOTE — Progress Notes (Signed)
Subjective: Doing well, no complaints, minimal pain, speech sounds very good. Taking by mouth well.  Objective: Vital signs in last 24 hours: Temp:  [97.1 F (36.2 C)-98.1 F (36.7 C)] 98.1 F (36.7 C) (05/09 0800) Pulse Rate:  [64-100] 65 (05/09 0900) Resp:  [10-67] 17 (05/09 0900) BP: (126-182)/(57-97) 145/57 mmHg (05/09 0900) SpO2:  [94 %-99 %] 99 % (05/09 0800) Weight:  [107 lb 8 oz (48.762 kg)-107 lb 9.4 oz (48.8 kg)] 107 lb 9.4 oz (48.8 kg) (05/08 1705) Weight change:     Intake/Output from previous day: 05/08 0701 - 05/09 0700 In: 2325 [P.O.:200; I.V.:2075; IV Piggyback:50] Out: 3007 [Urine:955; Drains:130; Blood:100] Intake/Output this shift: Total I/O In: 150 [I.V.:150] Out: -   PHYSICAL EXAM: Neck looks excellent. Shoulder shrug is normal. Flaps are down. JP functioning. Tongue mobility good. No airway problems. No swelling or hematoma. Tongue suture line excellent.  Lab Results: No results found for this basename: WBC, HGB, HCT, PLT,  in the last 72 hours BMET No results found for this basename: NA, K, CL, CO2, GLUCOSE, BUN, CREATININE, CALCIUM,  in the last 72 hours  Studies/Results: Dg Abd Portable 1v  01/07/2014   CLINICAL DATA:  Postoperative feeding tube placement.  EXAM: PORTABLE ABDOMEN - 1 VIEW  COMPARISON:  None.  FINDINGS: Soft feeding tube extends to the distal esophagus but then doubles back. This does not enter the abdomen. Gas pattern is unremarkable. Vascular calcification is noted.  IMPRESSION: Soft feeding tube extends to the distal esophagus but doubles back. The tube does not enter the abdomen.  These results will be called to the ordering clinician or representative by the Radiologist Assistant, and communication documented in the PACS Dashboard.   Electronically Signed   By: Nelson Chimes M.D.   On: 01/07/2014 14:40    Medications: I have reviewed the patient's current medications.  Assessment/Plan: Stable, postop day 1. Transferred to regular  room today. Plan to discharge home after the drain is removed either tomorrow or Monday.  LOS: 1 day   [name] 01/08/2014, 9:38 AM

## 2014-01-08 NOTE — Progress Notes (Signed)
INITIAL NUTRITION ASSESSMENT  DOCUMENTATION CODES Per approved criteria  -Severe malnutrition in the context of chronic illness -Underweight  Pt meets criteria for severe MALNUTRITION in the context of chronic illness as evidenced by 12.2% weight loss in 2-3 months, PO intake <75% for > one month.   INTERVENTION: -Recommend Ensure Complete po TID, each supplement provides 350 kcal and 13 grams of protein -Encouraged soft proteins, high kcal intake -Will continue to monitor  NUTRITION DIAGNOSIS: Inadequate oral intake related to chewing difficulty as evidenced by PO intake <75%, wt loss.   Goal: Pt to meet >/= 90% of their estimated nutrition needs    Monitor:  Total protein/energy intake, labs, weights, chewing/swallowing profile  Reason for Assessment: Consult  80 y.o. female  Admitting Dx: <principal problem not specified>  ASSESSMENT: Left oral tongue lesion, most likely carcinoma. Currently being worked up for a left pulmonary nodule. She has elected to have radiation without or biopsy. For the tongue, the best treatment is going to be surgical which would involve a partial glossectomy and modified neck dissection  -Received consult for TF initiation/management; however, per RD notes, NGT was pulled after it curled in esophagus -Pt was placed on clear liquid diet to assess tolerance. Pt consuming 25-50% -Diet recall indicates pt with decreased intake d/t difficulty chewing. Consume 2-3 small meals with soft foods. Drank Ensure once daily. Requires chopped meals, did not tolerate tough proteins-chicken, red meats -Noted to have experienced 15 lbs wt loss in 2-3 months -Encouraged soft proteins, and high kcal intake for weight maintenance. Will add Ensure Complete TID w/diet advancment -Discussed TF with RN, who reported that pt will likely not be started on nutrition support as she is tolerating clear liquid diet. Planned to confirm nutrition support/diet advancement plan  with MD   Height: Ht Readings from Last 1 Encounters:  01/07/14 5\' 6"  (1.676 m)    Weight: Wt Readings from Last 1 Encounters:  01/07/14 107 lb 9.4 oz (48.8 kg)    Ideal Body Weight: 130 lbs  % Ideal Body Weight: 82%  Wt Readings from Last 10 Encounters:  01/07/14 107 lb 9.4 oz (48.8 kg)  01/07/14 107 lb 9.4 oz (48.8 kg)  12/30/13 107 lb 8 oz (48.762 kg)  12/13/13 108 lb 8 oz (49.215 kg)  11/30/13 110 lb 8 oz (50.122 kg)  11/18/13 112 lb (50.803 kg)  11/02/13 111 lb (50.349 kg)  10/14/13 111 lb 11.2 oz (50.667 kg)  10/01/13 110 lb 4.8 oz (50.032 kg)  08/06/13 107 lb (48.535 kg)    Usual Body Weight: 122  % Usual Body Weight: 88%  BMI:  Body mass index is 17.37 kg/(m^2). Underweight  Estimated Nutritional Needs: Kcal: 1450-1650 Protein: 60-75 gram Fluid: >/=1500 ml/daily  Skin: WDL  Diet Order: Criss Rosales  EDUCATION NEEDS: -Education needs addressed   Intake/Output Summary (Last 24 hours) at 01/08/14 1116 Last data filed at 01/08/14 1000  Gross per 24 hour  Intake   2550 ml  Output    935 ml  Net   1615 ml    Last BM: pta   Labs:  No results found for this basename: NA, K, CL, CO2, BUN, CREATININE, CALCIUM, MG, PHOS, GLUCOSE,  in the last 168 hours  CBG (last 3)  No results found for this basename: GLUCAP,  in the last 72 hours  Scheduled Meds: . amLODipine  2.5 mg Oral Daily  . bacitracin  1 application Topical 3 times per day  . clindamycin (CLEOCIN) IV  300  mg Intravenous Q8H  . feeding supplement (ENSURE COMPLETE)  237 mL Oral TID WC  . metoprolol succinate  25 mg Oral Daily    Continuous Infusions: . dextrose 5 % and 0.9% NaCl 1,000 mL (01/08/14 0919)  . lactated ringers 10 mL/hr at 01/07/14 1011    Past Medical History  Diagnosis Date  . Hypertension   . Hemochromatosis   . Complication of anesthesia   . PONV (postoperative nausea and vomiting)     54 YRS AGO DURING CHILDBIRTH  . Lesion of left lung     ONLY DOING RADIATION  .  Anxiety     SINCE THIS IS COMING UP  . Cancer   . Arthritis     Past Surgical History  Procedure Laterality Date  . Tubal ligation    . Appendectomy    . Tonsillectomy    . Vein ligation Bilateral 1980's  . Eye surgery      BIL CATARACTS    [place] LDN Clinical Dietitian [phone]

## 2014-01-08 NOTE — Progress Notes (Signed)
Nurse did not administer 1000 BP meds; pt informed nurse that pt self administered home medications (same medication/same dose as verified by pill bottle located in pt room) prior to nurse arriving in room.  Nurse educated pt regarding danger of self administration of home medication while in facility and documented event in [facility].

## 2014-01-09 DIAGNOSIS — E43 Unspecified severe protein-calorie malnutrition: Secondary | ICD-10-CM | POA: Insufficient documentation

## 2014-01-09 NOTE — Discharge Instructions (Signed)
Apply antibiotic ointment to the incisions twice daily. Keep the dressing on the drain site if there is any drainage. Otherwise it is okay to keep it exposed. Call Dr. Constance Holster immediately if there is any unusual swelling or any other problems.

## 2014-01-09 NOTE — Discharge Summary (Signed)
Physician Discharge Summary  Patient ID: Brenda Gardner MRN: [id number] DOB/AGE: 10/24/1932 80 y.o.  Admit date: 01/07/2014 Discharge date: 01/09/2014  Admission Diagnoses: Tongue cancer  Discharge Diagnoses:  Active Problems:   Tongue cancer   Protein-calorie malnutrition, severe   Discharged Condition: good  Hospital Course: No complications  Consults: none  Significant Diagnostic Studies: none  Treatments: surgery: Partial glossectomy, modified neck dissection  Discharge Exam: Blood pressure 138/66, pulse 61, temperature 97.8 F (36.6 C), temperature source Oral, resp. rate 16, height 5\' 6"  (1.676 m), weight 107 lb 9.4 oz (48.8 kg), SpO2 96.00%. PHYSICAL EXAM: Tongue healing nicely. No swelling. Neck looks excellent. JP drain removed.  Disposition: Final discharge disposition not confirmed  Discharge Orders   Future Appointments Provider Department Dept Phone   01/12/2014 2:30 PM [facility] [phone]   01/12/2014 3:00 PM [facility] Provider [facility] [phone]   Future Orders Complete By Expires   Diet - low sodium heart healthy  As directed    Increase activity slowly  As directed        Medication List         amLODipine 2.5 MG tablet  Commonly known as:  NORVASC  Take 2.5 mg by mouth daily.     aspirin 325 MG EC tablet  Take 325 mg by mouth every 6 (six) hours as needed for pain or fever.     LORazepam 0.5 MG tablet  Commonly known as:  ATIVAN  Take 1 tablet (0.5 mg total) by mouth every 6 (six) hours as needed for anxiety.     metoprolol succinate 25 MG 24 hr tablet  Commonly known as:  TOPROL-XL  Take 25 mg by mouth daily.     multivitamin tablet  Take 1 tablet by mouth daily.           Follow-up Information   Follow up with [name], MD. Schedule an appointment as soon as possible for a visit in 1 week.   Specialty:  Otolaryngology   Contact information:   [address] [place] [address] [phone]       Signed: [name] 01/09/2014, 3:49 PM

## 2014-01-09 NOTE — Progress Notes (Signed)
Pt discharged to home accomp by family.  No Rx needed.  Pt is to follow up with Dr. Constance Holster in 1 week, pt has # to call if any problems.  Pt does not feel she needs any equipment for home and has 2 dtrs that live nearby if she has any needs.  Pt understands how to do wound care, cleansing with saline and then applying the bacitracin oint twice a day, Qtips given.  Drain has been removed and pt has a dressing over site.  Reviewed oral care.

## 2014-01-09 NOTE — Progress Notes (Signed)
Doing great, no complaints, minimal pain. Tongue is healing beautifully. Neck looks excellent. Drain removed. She may go home today.

## 2014-01-10 ENCOUNTER — Encounter: Payer: Self-pay | Admitting: Internal Medicine

## 2014-01-10 NOTE — Progress Notes (Signed)
[employer] denied lorazepam because the patient has not tried and failed any other SSRI meds.

## 2014-01-12 ENCOUNTER — Ambulatory Visit (HOSPITAL_BASED_OUTPATIENT_CLINIC_OR_DEPARTMENT_OTHER): Payer: Medicare Other | Admitting: Internal Medicine

## 2014-01-12 ENCOUNTER — Other Ambulatory Visit (HOSPITAL_BASED_OUTPATIENT_CLINIC_OR_DEPARTMENT_OTHER): Payer: Medicare Other

## 2014-01-12 ENCOUNTER — Telehealth: Payer: Self-pay | Admitting: Internal Medicine

## 2014-01-12 ENCOUNTER — Encounter (HOSPITAL_COMMUNITY): Payer: Self-pay | Admitting: Otolaryngology

## 2014-01-12 VITALS — BP 164/75 | HR 76 | Temp 98.0°F | Resp 18 | Ht 66.0 in | Wt 111.8 lb

## 2014-01-12 DIAGNOSIS — R911 Solitary pulmonary nodule: Secondary | ICD-10-CM

## 2014-01-12 DIAGNOSIS — R634 Abnormal weight loss: Secondary | ICD-10-CM

## 2014-01-12 DIAGNOSIS — E43 Unspecified severe protein-calorie malnutrition: Secondary | ICD-10-CM

## 2014-01-12 DIAGNOSIS — C029 Malignant neoplasm of tongue, unspecified: Secondary | ICD-10-CM

## 2014-01-12 DIAGNOSIS — F17200 Nicotine dependence, unspecified, uncomplicated: Secondary | ICD-10-CM

## 2014-01-12 DIAGNOSIS — I10 Essential (primary) hypertension: Secondary | ICD-10-CM

## 2014-01-12 DIAGNOSIS — Z720 Tobacco use: Secondary | ICD-10-CM

## 2014-01-12 LAB — BASIC METABOLIC PANEL (CC13)
ANION GAP: 14 meq/L — AB (ref 3–11)
BUN: 9.1 mg/dL (ref 7.0–26.0)
CO2: 21 meq/L — AB (ref 22–29)
Calcium: 9.4 mg/dL (ref 8.4–10.4)
Chloride: 106 mEq/L (ref 98–109)
Creatinine: 0.7 mg/dL (ref 0.6–1.1)
GLUCOSE: 107 mg/dL (ref 70–140)
Potassium: 3.6 mEq/L (ref 3.5–5.1)
SODIUM: 141 meq/L (ref 136–145)

## 2014-01-12 LAB — CBC WITH DIFFERENTIAL/PLATELET
BASO%: 0.2 % (ref 0.0–2.0)
Basophils Absolute: 0 10*3/uL (ref 0.0–0.1)
EOS ABS: 0 10*3/uL (ref 0.0–0.5)
EOS%: 0.3 % (ref 0.0–7.0)
HCT: 38.5 % (ref 34.8–46.6)
HEMOGLOBIN: 13.1 g/dL (ref 11.6–15.9)
LYMPH%: 20.5 % (ref 14.0–49.7)
MCH: 31.9 pg (ref 25.1–34.0)
MCHC: 34 g/dL (ref 31.5–36.0)
MCV: 93.7 fL (ref 79.5–101.0)
MONO#: 1.1 10*3/uL — ABNORMAL HIGH (ref 0.1–0.9)
MONO%: 18.7 % — AB (ref 0.0–14.0)
NEUT%: 60.3 % (ref 38.4–76.8)
NEUTROS ABS: 3.6 10*3/uL (ref 1.5–6.5)
Platelets: 172 10*3/uL (ref 145–400)
RBC: 4.11 10*6/uL (ref 3.70–5.45)
RDW: 14.2 % (ref 11.2–14.5)
WBC: 6 10*3/uL (ref 3.9–10.3)
lymph#: 1.2 10*3/uL (ref 0.9–3.3)

## 2014-01-12 LAB — IRON AND TIBC CHCC
%SAT: 19 % — ABNORMAL LOW (ref 21–57)
Iron: 47 ug/dL (ref 41–142)
TIBC: 249 ug/dL (ref 236–444)
UIBC: 201 ug/dL (ref 120–384)

## 2014-01-12 LAB — FERRITIN CHCC: Ferritin: 48 ng/ml (ref 9–269)

## 2014-01-12 NOTE — Telephone Encounter (Signed)
gv adn prnted appt sched and avs for pt for June

## 2014-01-12 NOTE — Progress Notes (Signed)
[facility] OFFICE PROGRESS NOTE  No PCP Per Patient No address on file  DIAGNOSIS: Tongue cancer  Weight loss  Protein-calorie malnutrition, severe  Nodule of left lung  Hemochromatosis, hereditary  Chief Complaint  Patient presents with  . Follow-up   CURRENT THERAPY: Phlebotomy to maintain ferritin less than 50.   INTERVAL HISTORY: Brenda Gardner 80 y.o. female with a history of hypertension, hereditary hemochromatosis, homozygous for the C282Y gene since September 2009 who is here for follow-up.  She was last seen by me on 12/13/2013.     Previously, she was referred to Dr. Erasmo Leventhal secondary to nodule in the left apex that appeared to be malignant and had an SUV of 4.4 without additional metabolic activity.  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  This caused substantial delay for her follow with Dr. Roxan Hockey.  She also reports significant anxiety related to anticipated anesthesia.  She was instructed to follow up with Dr. Roxan Hockey but stated she did not because she was too anxious. In the interim, we repeated her CT of Chest which demonstrated in increase in the size of the pulmonary nodule. She saw Dr. Roxan Hockey who recommended that she have a biopsy as she declined surgery.  When going to obtain biopsy, she reports increased anxiety and elevated blood pressures.  She then declined to continue.  Dr. Roxan Hockey referred her to Dr. Sondra Come on 3/25 who recommended that she obtain a PET to further evaluate a new tongue lesion and to determine the extent of the disease in her chest.  She lost a few pounds since her last.  She saw the ENT Doctor, Dr. Constance Holster and she underwent left tongue resection and node removal on 01/07/2014. Post-operative course was not complicated.  She has a follow up with Dr. Constance Holster later this week.   She denies hemoptysis. She admits to smoking a few cigarettes  daily.    Today, she is accompanied by her daughter Brenda Gardner.   MEDICAL HISTORY: Past Medical History  Diagnosis Date  . Hypertension   . Hemochromatosis   . Complication of anesthesia   . PONV (postoperative nausea and vomiting)     54 YRS AGO DURING CHILDBIRTH  . Lesion of left lung     ONLY DOING RADIATION  . Anxiety     SINCE THIS IS COMING UP  . Cancer   . Arthritis     INTERIM HISTORY: has Hemochromatosis, hereditary; Weight loss; Cough; Nodule of left lung; Hypertension; Tobacco abuse; Tongue cancer; and Protein-calorie malnutrition, severe on her problem list.    ALLERGIES:  is allergic to lisinopril.  MEDICATIONS: has a current medication list which includes the following prescription(s): amlodipine, aspirin, metoprolol succinate, and multivitamin.  SURGICAL HISTORY:  Past Surgical History  Procedure Laterality Date  . Tubal ligation    . Appendectomy    . Tonsillectomy    . Vein ligation Bilateral 1980's  . Eye surgery      BIL CATARACTS   PROBLEM LIST:  1. Hereditary hemochromatosis, homozygous for the C282Y gene with diagnosis established in September 2009 at which time, the ferritin level was 4484. The patient underwent a 1-unit phlebotomy on 05/18/2010 and on 12/07/2010. She underwent a 1/2-unit phlebotomy on 04/22/2012, 09/29/2012, 10/06/2012, and 10/27/2012. The patient now receives 200 mL of normal saline with each half-unit phlebotomy.  2. History of hypertension.  3. Abnormal MRI of the liver showing increased  iron deposition within the liver as well as evidence of cirrhosis in October 2009. Hepatomegaly is present on physical exam.  4. Weight loss over 2013. The patient's baseline weight has been running 120 pounds. On 09/29/2012 weight was 106 pounds.  5. Cough started in December of 2013. The patient's cough has resolved.  6. Nodule seen in the apex of the left lung on CT scan of the chest carried out without IV contrast on 11/16/2012.  REVIEW OF SYSTEMS:    Constitutional: Denies fevers, chills or abnormal weight loss Eyes: Denies blurriness of vision Ears, nose, mouth, throat, and face: Denies mucositis or sore throat Respiratory: Denies cough, dyspnea or wheezes Cardiovascular: Denies palpitation, chest discomfort or lower extremity swelling Gastrointestinal:  Denies nausea, heartburn or change in bowel habits Skin: Denies abnormal skin rashes Lymphatics: Denies new lymphadenopathy or easy bruising Neurological:Denies numbness, tingling or new weaknesses Behavioral/Psych: Mood is stable, no new changes  All other systems were reviewed with the patient and are negative.  PHYSICAL EXAMINATION: ECOG PERFORMANCE STATUS: 1 - Symptomatic but completely ambulatory  Blood pressure 164/75, pulse 76, temperature 98 F (36.7 C), temperature source Oral, resp. rate 18, height 5\' 6"  (1.676 m), weight 111 lb 12.8 oz (50.712 kg), SpO2 98.00%.  GENERAL:alert, no distress and comfortable; very thin, frail appearing female.  SKIN: skin color, texture, turgor are normal, no rashes or significant lesions; very brittle appearing skin.  EYES: normal, Conjunctiva are pink and non-injected, sclera clear OROPHARYNX:no exudate, no erythema and lips, buccal mucosa, and s/p left tongue resection.  NECK: supple, thyroid normal size, non-tender, without nodularity; stapled in tact without evidence of infection. Swollen at the base on neck.  LYMPH:  no palpable lymphadenopathy in the cervical, axillary or supraclavicular LUNGS: clear to auscultation with normal breathing effort with a prolonged expiratory phase HEART: regular rate & rhythm and no murmurs and no lower extremity edema ABDOMEN:abdomen soft, non-tender and normal bowel sounds Musculoskeletal:no cyanosis of digits and no clubbing  NEURO: alert & oriented x 3 with fluent speech, no focal motor/sensory deficits; cautious gait.    Labs:  Lab Results  Component Value Date   WBC 6.0 01/12/2014   HGB 13.1  01/12/2014   HCT 38.5 01/12/2014   MCV 93.7 01/12/2014   PLT 172 01/12/2014   NEUTROABS 3.6 01/12/2014      Chemistry      Component Value Date/Time   NA 141 01/12/2014 1435   NA 140 12/30/2013 1430   K 3.6 01/12/2014 1435   K 4.4 12/30/2013 1430   CL 99 12/30/2013 1430   CL 103 02/05/2013 1136   CO2 21* 01/12/2014 1435   CO2 26 12/30/2013 1430   BUN 9.1 01/12/2014 1435   BUN 11 12/30/2013 1430   CREATININE 0.7 01/12/2014 1435   CREATININE 0.71 12/30/2013 1430      Component Value Date/Time   CALCIUM 9.4 01/12/2014 1435   CALCIUM 9.5 12/30/2013 1430   ALKPHOS 101 12/30/2013 1430   ALKPHOS 94 12/13/2013 1227   AST 40* 12/30/2013 1430   AST 30 12/13/2013 1227   ALT 14 12/30/2013 1430   ALT 11 12/13/2013 1227   BILITOT 0.5 12/30/2013 1430   BILITOT 0.62 12/13/2013 1227     Iron/TIBC/Ferritin    Component Value Date/Time   IRON 269* 10/01/2013 1217   IRON 95 02/05/2013 1136   TIBC 255 10/01/2013 1217   TIBC 293 02/05/2013 1136   FERRITIN 30 12/13/2013 1227   FERRITIN 33 12/15/2012 1004  CBC:  Recent Labs Lab 01/12/14 1435  WBC 6.0  NEUTROABS 3.6  HGB 13.1  HCT 38.5  MCV 93.7  PLT 172   Studies:  No results found.  RADIOGRAPHIC STUDIES: 1. MRI of the abdomen with and without IV contrast on 06/05/2008 showed marked iron deposition within the liver with sparing of the spleen consistent with genetic hemochromatosis. There was atrophy of the left lateral hepatic lobe with mild linear enhancement. This was favored to be most consistent with cirrhosis rather than hepatocellular carcinoma. There was mild iron deposition within the pancreas.  2. Complete abdominal ultrasound of the abdomen on 05/09/2009 showed no gallstones within the gallbladder and a normal common bile duct. There was heterogeneous echotexture of the liver, felt to be due to hepatitis or cirrhosis. There was no focal hepatic mass. No hydronephrosis or renal calculi.  3. Chest x-ray, 2 view, from 07/27/2009 showed mild  cardiomegaly with no acute findings.  4. MRI of the abdomen with and without IV contrast on 04/28/2012 showed a decrease in the apparent degree of iron deposition within the nodular appearing liver compatible with improvement and secondary evidence of hemochromatosis. There were no enhancing lesions to suggest hepatocellular carcinoma. There was mild pancreatic iron deposition. There was evidence of left hepatic lobe atrophy with probable internal fibrosis/scarring. The study was compared with the prior MRI from 06/05/2008 and the previous ultrasound from 05/09/2009. Consideration of annual MRI surveillance was stated.  5. Chest x-ray, 2 view, from 11/05/2012 showed COPD. There was a possible sub-centimeter nodule in the left upper lobe. Chest CT scan was recommended. Chest x-ray was compared with the previous chest x-ray from 07/27/2009.  6. CT scan of the chest without IV contrast from 11/16/2012 showed a 10 x 7 mm left apical lesion suspicious for neoplasm. Scarring could not be excluded. There were underlying emphysematous changes without mediastinal or hilar lymphadenopathy. There were moderate atherosclerotic calcifications involving the aorta and branch vessels.  7. CT scan of the chest without IV contrast on 02/01/2013 showed stable appearance of the 7 x 10-mm spiculated nodule in the anterior left upper lobe. A PET CT scan was suggested.  8. CT scan of the chest without IV contrast on 10/08/2013 showed further increase in size of left apical nodule which is concerning for primary neoplasm of the lung and a calcified atherosclerotic disease including multi vessel coronary artery calcifications. 9. PET scan on 12/09/2013.  This demonstrated an enlarging left apical lung mass with neoplastic range FDG uptake increased since the prior PET-CT. No findings for mediastinal or hilar lymphadenopathy or pulmonary metastasis. 19 x 12 mm soft tissue mass associated with the eft aspect of the tongue.  This is  metabolically active and consistent with neoplasm. No neck adenopathy.    PATHOLOGY: 1. Tongue, biopsy, left - INVASIVE SQUAMOUS CELL CARCINOMA 2. Tongue, resection for tumor, left partial glossectomy - INVASIVE SQUAMOUS CELL CARCINOMA. - SURGICAL MARGINS, NEGATIVE FOR TUMOR. - SEE TUMOR SYNOPTIC TEMPLATE BELOW. 3. Lymph nodes, radical neck dissection, Left Supraomohyoid - BENIGN SUBMANDIBULAR GLAND; NEGATIVE FOR ATYPIA OR MALIGNANCY. - ONE LEVEL 1 LYMPH NODE, NEGATIVE FOR TUMOR (0/1). - FIVE LEVEL 2 LYMPH NODES, NEGATIVE FOR TUMOR (0/5). - SEVEN LEVEL 3 LYMPH NODES, NEGATIVE FOR TUMOR (0/7). - THREE LEVEL 4 LYMPH NODES, NEGATIVE FOR TUMOR (0/3). - ONE ADDITIONAL SOFT TISSUE LYMPH NODE, NEGATIVE FOR TUMOR (0/1).  ASSESSMENT: Brenda Gardner 80 y.o. female with a history of Tongue cancer  Weight loss  Protein-calorie malnutrition, severe  Nodule  of left lung  Hemochromatosis, hereditary  PLAN:   1. Hemochromatosis.  --She continues to do well overall from this standpoint.   Her ferritin is pending today.  We will perform phlebotomy next month.   2. Hypertension. --She was counseled to avoid high salt diet. She reports that her blood pressures are better controlled at home.   3. Nodule of left lung concerning for primary neoplasm of the lung. --Patient reported that she will schedule a follow-up for her Left lung nodule previously with Dr. Roxan Hockey.  Repeat CT of chest without contrast to assess for growth showed further increase in size of left apical nodule which is concerning for primary neoplasm of the lung.  A referral was made to Cardio-thoracic surgery. She decline surgery and subsequent referral for biopsy.  PET reviewed in depth today demonstrating new tongue lesion as well.  She has a follow up with radiation oncology.    4. Tongue, SCC s/p partial resection (pT2,N0). --She is being followed by ENT.  Likely to start treatment for number 3.     5. Weight lost. --I  counseled her to increase her ensure to 2-3 cans daily. Maximize nutrition to help with healing.   6. Tobacco Abuse. -- Patient was counseled to continue to stop her smoking.   She did elect for referral to tobacco cessation classes.   7.  Follow-up. --Patient will return to our office in 4 weeks for repeat labs including CBC, CMET, iron studies and ferritin.   All questions were answered. The patient knows to call the clinic with any problems, questions or concerns. We can certainly see the patient much sooner if necessary.  I spent 15 minutes counseling the patient face to face. The total time spent in the appointment was 25 minutes.    Concha Norway, MD 01/12/2014 3:35 PM

## 2014-02-09 ENCOUNTER — Telehealth: Payer: Self-pay | Admitting: Internal Medicine

## 2014-02-09 ENCOUNTER — Other Ambulatory Visit (HOSPITAL_BASED_OUTPATIENT_CLINIC_OR_DEPARTMENT_OTHER): Payer: Medicare Other

## 2014-02-09 ENCOUNTER — Ambulatory Visit (HOSPITAL_BASED_OUTPATIENT_CLINIC_OR_DEPARTMENT_OTHER): Payer: Medicare Other | Admitting: Internal Medicine

## 2014-02-09 DIAGNOSIS — R911 Solitary pulmonary nodule: Secondary | ICD-10-CM

## 2014-02-09 DIAGNOSIS — Z720 Tobacco use: Secondary | ICD-10-CM

## 2014-02-09 DIAGNOSIS — R259 Unspecified abnormal involuntary movements: Secondary | ICD-10-CM

## 2014-02-09 DIAGNOSIS — E43 Unspecified severe protein-calorie malnutrition: Secondary | ICD-10-CM

## 2014-02-09 DIAGNOSIS — I10 Essential (primary) hypertension: Secondary | ICD-10-CM

## 2014-02-09 DIAGNOSIS — F17200 Nicotine dependence, unspecified, uncomplicated: Secondary | ICD-10-CM

## 2014-02-09 DIAGNOSIS — C029 Malignant neoplasm of tongue, unspecified: Secondary | ICD-10-CM

## 2014-02-09 DIAGNOSIS — R634 Abnormal weight loss: Secondary | ICD-10-CM

## 2014-02-09 LAB — COMPREHENSIVE METABOLIC PANEL (CC13)
ALBUMIN: 3.7 g/dL (ref 3.5–5.0)
ALT: 12 U/L (ref 0–55)
AST: 30 U/L (ref 5–34)
Alkaline Phosphatase: 79 U/L (ref 40–150)
Anion Gap: 13 mEq/L — ABNORMAL HIGH (ref 3–11)
BUN: 14.6 mg/dL (ref 7.0–26.0)
CHLORIDE: 101 meq/L (ref 98–109)
CO2: 25 mEq/L (ref 22–29)
Calcium: 9.7 mg/dL (ref 8.4–10.4)
Creatinine: 0.8 mg/dL (ref 0.6–1.1)
GLUCOSE: 152 mg/dL — AB (ref 70–140)
POTASSIUM: 4.1 meq/L (ref 3.5–5.1)
SODIUM: 139 meq/L (ref 136–145)
TOTAL PROTEIN: 7.4 g/dL (ref 6.4–8.3)
Total Bilirubin: 0.6 mg/dL (ref 0.20–1.20)

## 2014-02-09 LAB — CBC WITH DIFFERENTIAL/PLATELET
BASO%: 0.2 % (ref 0.0–2.0)
Basophils Absolute: 0 10*3/uL (ref 0.0–0.1)
EOS ABS: 0.1 10*3/uL (ref 0.0–0.5)
EOS%: 1.2 % (ref 0.0–7.0)
HCT: 41.2 % (ref 34.8–46.6)
HGB: 13.9 g/dL (ref 11.6–15.9)
LYMPH#: 1.8 10*3/uL (ref 0.9–3.3)
LYMPH%: 35.9 % (ref 14.0–49.7)
MCH: 31.1 pg (ref 25.1–34.0)
MCHC: 33.7 g/dL (ref 31.5–36.0)
MCV: 92.2 fL (ref 79.5–101.0)
MONO#: 0.8 10*3/uL (ref 0.1–0.9)
MONO%: 16.2 % — AB (ref 0.0–14.0)
NEUT#: 2.4 10*3/uL (ref 1.5–6.5)
NEUT%: 46.5 % (ref 38.4–76.8)
Platelets: 168 10*3/uL (ref 145–400)
RBC: 4.47 10*6/uL (ref 3.70–5.45)
RDW: 14.5 % (ref 11.2–14.5)
WBC: 5.1 10*3/uL (ref 3.9–10.3)

## 2014-02-09 LAB — FERRITIN CHCC: Ferritin: 34 ng/ml (ref 9–269)

## 2014-02-09 NOTE — Progress Notes (Signed)
After visit with Dr. Juliann Mule, requested that we call her dtr.and have her pick her up.  Let her know that if she came to pick up her car after [employer] parking had closed, that security would have her keys and she would be able to retrieve her car.  Patient refused to allow me to call her dtr.  States she feels fine and well able to drive.  She is in a wheelchair, but refuses wheelchair to go to scheduling.  Walked with patient to scheduling and then Kalman Shan will escort her to [employer].

## 2014-02-09 NOTE — Progress Notes (Signed)
[facility] OFFICE PROGRESS NOTE  No PCP Per Patient No address on file  DIAGNOSIS: Hemochromatosis, hereditary - Plan: CBC with Differential, Comprehensive metabolic panel (Cmet) - [facility], Lactate dehydrogenase (LDH) - [facility]  Protein-calorie malnutrition, severe - Plan: CBC with Differential, Comprehensive metabolic panel (Cmet) - [facility], Lactate dehydrogenase (LDH) - [facility]  Tobacco abuse - Plan: CBC with Differential, Comprehensive metabolic panel (Cmet) - [facility], Lactate dehydrogenase (LDH) - [facility]  Tongue cancer - Plan: CBC with Differential, Comprehensive metabolic panel (Cmet) - [facility], Lactate dehydrogenase (LDH) - [facility]  Weight loss  Chief Complaint  Patient presents with  . Follow-up   CURRENT THERAPY: Phlebotomy to maintain ferritin less than 50.   INTERVAL HISTORY: Brenda Gardner 80 y.o. female with a history of hypertension, hereditary hemochromatosis, homozygous for the C282Y gene since September 2009 who is here for follow-up.  She was last seen by me on 01/12/2014.     She underwent left tongue resection and node removal on 01/07/2014. Post-operative course was not complicated. She has an appointmner with Dr. Sondra Come next week. She is down to 3 cigarettes per day (with meals). She reports following with Dr. Constance Holster and he stated that she did not require XRT to her head/neck based on negative lymph node involvement. She is scheduled to follow up for evaluation by Dr. Sondra Come for XRT for her lung. She also notes feeling jittery this afternoon.  She reports adequate po intake and denies a history of hypoglycemic episodes.   MEDICAL HISTORY: Past Medical History  Diagnosis Date  . Hypertension   . Hemochromatosis   . Complication of anesthesia   . PONV (postoperative nausea and vomiting)     54 YRS AGO DURING CHILDBIRTH  . Lesion of left lung     ONLY DOING RADIATION  . Anxiety     SINCE THIS IS COMING UP  . Cancer   . Arthritis     INTERIM HISTORY: has  Hemochromatosis, hereditary; Weight loss; Cough; Nodule of left lung; Hypertension; Tobacco abuse; Tongue cancer; and Protein-calorie malnutrition, severe on her problem list.    ALLERGIES:  is allergic to lisinopril.  MEDICATIONS: has a current medication list which includes the following prescription(s): amlodipine, aspirin, metoprolol succinate, and multivitamin.  SURGICAL HISTORY:  Past Surgical History  Procedure Laterality Date  . Tubal ligation    . Appendectomy    . Tonsillectomy    . Vein ligation Bilateral 1980's  . Eye surgery      BIL CATARACTS  . Glossectomy Left 01/07/2014    Procedure: PARTIAL GLOSSECTOMY;  Surgeon: [name], MD;  Location: [facility];  Service: ENT;  Laterality: Left;  . Radical neck dissection Left 01/07/2014    Procedure: LEFT  NECK DISSECTION WITH FROZEN SECTION;  Surgeon: [name], MD;  Location: [facility];  Service: ENT;  Laterality: Left;   PROBLEM LIST:  1. Hereditary hemochromatosis, homozygous for the C282Y gene with diagnosis established in September 2009 at which time, the ferritin level was 4484. The patient underwent a 1-unit phlebotomy on 05/18/2010 and on 12/07/2010. She underwent a 1/2-unit phlebotomy on 04/22/2012, 09/29/2012, 10/06/2012, and 10/27/2012. The patient now receives 200 mL of normal saline with each half-unit phlebotomy.  2. History of hypertension.  3. Abnormal MRI of the liver showing increased iron deposition within the liver as well as evidence of cirrhosis in October 2009. Hepatomegaly is present on physical exam.  4. Weight loss over 2013. The patient's baseline weight has been running 120 pounds. On 09/29/2012  weight was 106 pounds.  5. Cough started in December of 2013. The patient's cough has resolved.  6. Nodule seen in the apex of the left lung on CT scan of the chest carried out without IV contrast on 11/16/2012.  REVIEW OF SYSTEMS:   Constitutional: Denies fevers, chills or abnormal weight loss Eyes: Denies blurriness  of vision Ears, nose, mouth, throat, and face: Denies mucositis or sore throat Respiratory: Denies cough, dyspnea or wheezes Cardiovascular: Denies palpitation, chest discomfort or lower extremity swelling Gastrointestinal:  Denies nausea, heartburn or change in bowel habits Skin: Denies abnormal skin rashes Lymphatics: Denies new lymphadenopathy or easy bruising Neurological:Denies numbness, tingling or new weaknesses Behavioral/Psych: Mood is stable, no new changes  All other systems were reviewed with the patient and are negative.  PHYSICAL EXAMINATION: ECOG PERFORMANCE STATUS: 1 - Symptomatic but completely ambulatory  Blood pressure 169/94, pulse 91, temperature 98.6 F (37 C), temperature source Oral, resp. rate 18, height 5\' 6"  (1.676 m), weight 104 lb 14.4 oz (47.582 kg).  GENERAL:alert, no distress and comfortable; very thin, frail appearing female.  SKIN: skin color, texture, turgor are normal, no rashes or significant lesions; very brittle appearing skin.  EYES: normal, Conjunctiva are pink and non-injected, sclera clear OROPHARYNX:no exudate, no erythema and lips, buccal mucosa, and s/p left tongue resection.  NECK: supple, thyroid normal size, non-tender, without nodularity; stapled in tact without evidence of infection. Swollen at the base on neck.  LYMPH:  no palpable lymphadenopathy in the cervical, axillary or supraclavicular LUNGS: clear to auscultation with normal breathing effort with a prolonged expiratory phase HEART: regular rate & rhythm and no murmurs and no lower extremity edema ABDOMEN:abdomen soft, non-tender and normal bowel sounds Musculoskeletal:no cyanosis of digits and no clubbing  NEURO: alert & oriented x 3 with fluent speech, no focal motor/sensory deficits; cautious gait.    Labs:  Lab Results  Component Value Date   WBC 5.1 02/09/2014   HGB 13.9 02/09/2014   HCT 41.2 02/09/2014   MCV 92.2 02/09/2014   PLT 168 02/09/2014   NEUTROABS 2.4 02/09/2014       Chemistry      Component Value Date/Time   NA 139 02/09/2014 1315   NA 140 12/30/2013 1430   K 4.1 02/09/2014 1315   K 4.4 12/30/2013 1430   CL 99 12/30/2013 1430   CL 103 02/05/2013 1136   CO2 25 02/09/2014 1315   CO2 26 12/30/2013 1430   BUN 14.6 02/09/2014 1315   BUN 11 12/30/2013 1430   CREATININE 0.8 02/09/2014 1315   CREATININE 0.71 12/30/2013 1430      Component Value Date/Time   CALCIUM 9.7 02/09/2014 1315   CALCIUM 9.5 12/30/2013 1430   ALKPHOS 79 02/09/2014 1315   ALKPHOS 101 12/30/2013 1430   AST 30 02/09/2014 1315   AST 40* 12/30/2013 1430   ALT 12 02/09/2014 1315   ALT 14 12/30/2013 1430   BILITOT 0.60 02/09/2014 1315   BILITOT 0.5 12/30/2013 1430     Iron/TIBC/Ferritin    Component Value Date/Time   IRON 47 01/12/2014 1435   IRON 95 02/05/2013 1136   TIBC 249 01/12/2014 1435   TIBC 293 02/05/2013 1136   FERRITIN 34 02/09/2014 1315   FERRITIN 33 12/15/2012 1004    CBC:  Recent Labs Lab 02/09/14 1315  WBC 5.1  NEUTROABS 2.4  HGB 13.9  HCT 41.2  MCV 92.2  PLT 168   Studies:  No results found.  RADIOGRAPHIC STUDIES: 1. MRI of  the abdomen with and without IV contrast on 06/05/2008 showed marked iron deposition within the liver with sparing of the spleen consistent with genetic hemochromatosis. There was atrophy of the left lateral hepatic lobe with mild linear enhancement. This was favored to be most consistent with cirrhosis rather than hepatocellular carcinoma. There was mild iron deposition within the pancreas.  2. Complete abdominal ultrasound of the abdomen on 05/09/2009 showed no gallstones within the gallbladder and a normal common bile duct. There was heterogeneous echotexture of the liver, felt to be due to hepatitis or cirrhosis. There was no focal hepatic mass. No hydronephrosis or renal calculi.  3. Chest x-ray, 2 view, from 07/27/2009 showed mild cardiomegaly with no acute findings.  4. MRI of the abdomen with and without IV contrast on 04/28/2012 showed a  decrease in the apparent degree of iron deposition within the nodular appearing liver compatible with improvement and secondary evidence of hemochromatosis. There were no enhancing lesions to suggest hepatocellular carcinoma. There was mild pancreatic iron deposition. There was evidence of left hepatic lobe atrophy with probable internal fibrosis/scarring. The study was compared with the prior MRI from 06/05/2008 and the previous ultrasound from 05/09/2009. Consideration of annual MRI surveillance was stated.  5. Chest x-ray, 2 view, from 11/05/2012 showed COPD. There was a possible sub-centimeter nodule in the left upper lobe. Chest CT scan was recommended. Chest x-ray was compared with the previous chest x-ray from 07/27/2009.  6. CT scan of the chest without IV contrast from 11/16/2012 showed a 10 x 7 mm left apical lesion suspicious for neoplasm. Scarring could not be excluded. There were underlying emphysematous changes without mediastinal or hilar lymphadenopathy. There were moderate atherosclerotic calcifications involving the aorta and branch vessels.  7. CT scan of the chest without IV contrast on 02/01/2013 showed stable appearance of the 7 x 10-mm spiculated nodule in the anterior left upper lobe. A PET CT scan was suggested.  8. CT scan of the chest without IV contrast on 10/08/2013 showed further increase in size of left apical nodule which is concerning for primary neoplasm of the lung and a calcified atherosclerotic disease including multi vessel coronary artery calcifications. 9. PET scan on 12/09/2013.  This demonstrated an enlarging left apical lung mass with neoplastic range FDG uptake increased since the prior PET-CT. No findings for mediastinal or hilar lymphadenopathy or pulmonary metastasis. 19 x 12 mm soft tissue mass associated with the eft aspect of the tongue.  This is metabolically active and consistent with neoplasm. No neck adenopathy.    PATHOLOGY: 1. Tongue, biopsy, left -  INVASIVE SQUAMOUS CELL CARCINOMA 2. Tongue, resection for tumor, left partial glossectomy - INVASIVE SQUAMOUS CELL CARCINOMA. - SURGICAL MARGINS, NEGATIVE FOR TUMOR. - SEE TUMOR SYNOPTIC TEMPLATE BELOW. 3. Lymph nodes, radical neck dissection, Left Supraomohyoid - BENIGN SUBMANDIBULAR GLAND; NEGATIVE FOR ATYPIA OR MALIGNANCY. - ONE LEVEL 1 LYMPH NODE, NEGATIVE FOR TUMOR (0/1). - FIVE LEVEL 2 LYMPH NODES, NEGATIVE FOR TUMOR (0/5). - SEVEN LEVEL 3 LYMPH NODES, NEGATIVE FOR TUMOR (0/7). - THREE LEVEL 4 LYMPH NODES, NEGATIVE FOR TUMOR (0/3). - ONE ADDITIONAL SOFT TISSUE LYMPH NODE, NEGATIVE FOR TUMOR (0/1).  ASSESSMENT: Brenda Gardner 80 y.o. female with a history of Hemochromatosis, hereditary - Plan: CBC with Differential, Comprehensive metabolic panel (Cmet) - [facility], Lactate dehydrogenase (LDH) - [facility]  Protein-calorie malnutrition, severe - Plan: CBC with Differential, Comprehensive metabolic panel (Cmet) - [facility], Lactate dehydrogenase (LDH) - [facility]  Tobacco abuse - Plan: CBC with Differential, Comprehensive metabolic panel (  Cmet) - [facility], Lactate dehydrogenase (LDH) - [facility]  Tongue cancer - Plan: CBC with Differential, Comprehensive metabolic panel (Cmet) - [facility], Lactate dehydrogenase (LDH) - [facility]  Weight loss  PLAN:   1. Hemochromatosis.  --She continues to do well overall from this standpoint.   Her ferritin is 33.  She will not require a phlebotomy.    2. Hypertension. --She was counseled to avoid high salt diet. She reports that her blood pressures are better controlled at home.   3. Nodule of left lung concerning for primary neoplasm of the lung. --Patient reported that she will schedule a follow-up for her Left lung nodule previously with Dr. Roxan Hockey.  Repeat CT of chest without contrast to assess for growth showed further increase in size of left apical nodule which is concerning for primary neoplasm of the lung.  A referral was made to Cardio-thoracic surgery. She decline  surgery and subsequent referral for biopsy.  PET reviewed in depth today demonstrating new tongue lesion as well.  She has a follow up with radiation oncology (Dr. Sondra Come).   4. Tongue, SCC s/p partial resection (pT2,N0). --She is being followed by ENT.  Lymph nodes are negative. Likely to start treatment for number 3.     5. Weight lost. --I counseled her to increase her ensure to 2-3 cans daily. Maximize nutrition to help with healing.   6. Tobacco Abuse. -- Patient was counseled to continue to stop her smoking.   She did elect for referral to tobacco cessation classes.   7. Tremors and jittery.  --Her glucose was adequate. Unclear etiology. We advised her to have a family member drive her home but she declined.    8.  Follow-up. --Patient will return to our office in 4 weeks for repeat labs including CBC, CMET, iron studies and ferritin.   All questions were answered. The patient knows to call the clinic with any problems, questions or concerns. We can certainly see the patient much sooner if necessary.  I spent 15 minutes counseling the patient face to face. The total time spent in the appointment was 25 minutes.    Eidan Muellner, MD 02/10/2014 10:49 PM

## 2014-02-09 NOTE — Telephone Encounter (Signed)
gave pt appt for lab and MD for July 2015

## 2014-02-10 ENCOUNTER — Encounter: Payer: Self-pay | Admitting: Internal Medicine

## 2014-02-14 NOTE — Progress Notes (Signed)
Thoracic Location of Tumor / Histology: Left upper lobe mass   Patient presented earlier this year with a left upper lobe nodule found on a chest x ray.   Biopsies revealed: was scheduled for 11/18/2013. Patient refused to have it done.   Tobacco/Marijuana/Snuff/ETOH use: current every day smoker - smokes 1/2 ppd. history of smoking for 50 years Drinks 1-2 drinks per night.   Past/Anticipated interventions by cardiothoracic surgery, if any: patient refused surgery.  01/07/14 Diagnosis 1. Tongue, biopsy, left - INVASIVE SQUAMOUS CELL CARCINOMA 2. Tongue, resection for tumor, left partial glossectomy - INVASIVE SQUAMOUS CELL CARCINOMA. - SURGICAL MARGINS, NEGATIVE FOR TUMOR. - SEE TUMOR SYNOPTIC TEMPLATE BELOW. 3. Lymph nodes, radical neck dissection, Left Supraomohyoid - BENIGN SUBMANDIBULAR GLAND; NEGATIVE FOR ATYPIA OR MALIGNANCY. - ONE LEVEL 1 LYMPH NODE, NEGATIVE FOR TUMOR (0/1). - FIVE LEVEL 2 LYMPH NODES, NEGATIVE FOR TUMOR (0/5). - SEVEN LEVEL 3 LYMPH NODES, NEGATIVE FOR TUMOR (0/7). - THREE LEVEL 4 LYMPH NODES, NEGATIVE FOR TUMOR (0/3). - ONE ADDITIONAL SOFT TISSUE LYMPH NODE, NEGATIVE FOR TUMOR (0/1).   Past/Anticipated interventions by medical oncology, if any: saw Dr. Juliann Mule - 02/09/14  Signs/Symptoms  Weight changes, if OIZ:TIWP 9 lbs since 01/12/14 Respiratory complaints, if any: coughs when she first gets up in the morning. Denies shortness of breath.  Hemoptysis, if any: no  Pain issues, if any: no   SAFETY ISSUES:  Prior radiation? no        Pacemaker/ICD? no              Possible current pregnancy? no  Is the patient on methotrexate? No   Current Complaints / other details: She underwent left tongue resection and node removal on 01/07/2014.  Per Dr. Juliann Mule - "She reports following with Dr. Constance Holster and he stated that she did not require XRT to her head/neck based on negative lymph node involvement."

## 2014-02-17 ENCOUNTER — Ambulatory Visit
Admission: RE | Admit: 2014-02-17 | Discharge: 2014-02-17 | Disposition: A | Discharge status: Home / Self Care | Payer: Medicare Other | Source: Ambulatory Visit | Attending: Radiation Oncology | Admitting: Radiation Oncology

## 2014-02-17 ENCOUNTER — Encounter: Payer: Self-pay | Admitting: Radiation Oncology

## 2014-02-17 VITALS — BP 166/90 | HR 97 | Temp 98.1°F | Ht 66.0 in | Wt 102.4 lb

## 2014-02-17 DIAGNOSIS — D491 Neoplasm of unspecified behavior of respiratory system: Secondary | ICD-10-CM | POA: Diagnosis not present

## 2014-02-17 DIAGNOSIS — Z510 Encounter for antineoplastic radiation therapy: Secondary | ICD-10-CM | POA: Insufficient documentation

## 2014-02-17 DIAGNOSIS — R911 Solitary pulmonary nodule: Secondary | ICD-10-CM | POA: Insufficient documentation

## 2014-02-17 NOTE — Progress Notes (Signed)
Radiation Oncology         [phone] ________________________________  Name: Brenda Gardner MRN: [id number]  Date: 02/17/2014  DOB: [birth year]  Follow-Up Visit Note  CC: No PCP Per Patient  [name], MD  Diagnosis:  PET positive pulmonary nodule in the left apical lung   Narrative:  The patient returns today for further evaluation. She was seen in consultation 11/30/2013. Patient was being considered for treatment of her solitary primary nodule in the left apical lung region. At That presentation the patient was complaining the a lesion along her left tongue. She had clinical findings suspicious for oral tongue cancer. She did proceed with a PET scan which showed activity in her pulmonary nodule and left tongue region but no other areas. The patient was referred to Dr. Constance Holster and proceeded to undergo surgery with a partial glossectomy and left node dissection (supra-omohyoid).  Pathology from this procedure revealed invasive squamous cell carcinoma with clear margins.  Multiple lymph nodes were removed from the left upper neck showing no evidence of metastatic spread. Given the complete resection and negative lymph node involvement the patient will not require any additional adjuvant treatment for this problem.  The patient is feeling well at this time. She is regaining her taste and is able to eat well. She denies any choking sensations or significant fatigue. sHe denies any significant cough or breathing problems. Her energy level is good  Patient now presents for further evaluation as it relates to her left apical pulmonary nodule. Today I discussed the consideration for biopsy this area but the patient again does not wish to have biopsy of this area. She is willing to proceed with definitive treatment without tissue diagnosis. The location of the lesion may not permit SBRT but certainly hypofractionated treatment in light of the small volume.                       ALLERGIES:  is  allergic to lisinopril.  Meds: Current Outpatient Prescriptions  Medication Sig Dispense Refill  . amLODipine (NORVASC) 2.5 MG tablet Take 2.5 mg by mouth daily.      Marland Kitchen aspirin 325 MG EC tablet Take 325 mg by mouth every 6 (six) hours as needed for pain or fever.      . metoprolol succinate (TOPROL-XL) 25 MG 24 hr tablet Take 25 mg by mouth daily.      . Multiple Vitamin (MULTIVITAMIN) tablet Take 1 tablet by mouth daily.       No current facility-administered medications for this encounter.    Physical Findings: The patient is in no acute distress. Patient is alert and oriented.  height is 5\' 6"  (1.676 m) and weight is 102 lb 6.4 oz (46.448 kg). Her temperature is 98.1 F (36.7 C). Her blood pressure is 166/90 and her pulse is 97. Her oxygen saturation is 96%. .  Examination of the neck area reveals a well-healed scar in the left upper neck without signs of drainage or infection. No palpable adenopathy in the cervical or supraclavicular or axillary areas. The oral cavity is free of secondary infection. There is a minor defect along the left lateral oral tongue region. The lungs are clear. The heart has a regular rhythm and rate.   Lab Findings: Lab Results  Component Value Date   WBC 5.1 02/09/2014   HGB 13.9 02/09/2014   HCT 41.2 02/09/2014   MCV 92.2 02/09/2014   PLT 168 02/09/2014      Radiographic  Findings: No results found.  Impression:    PET positive pulmonary nodule in the left apical lung.  As above the patient does not wish to proceed with biopsy of this area for fear of lung collapse and respiratory demise.  Patient is willing to proceed with radiation therapy without tissue diagnosis but with PET scan highly suspicious for non-small cell lung cancer.   Plan:  Simulation and planning for SBRT/hypofractionated treatment in the near future.  ____________________________________ Blair Promise, MD

## 2014-02-17 NOTE — Progress Notes (Signed)
Please see the Nurse Progress Note in the MD Initial Consult Encounter for this patient. 

## 2014-03-01 ENCOUNTER — Ambulatory Visit
Admission: RE | Admit: 2014-03-01 | Discharge: 2014-03-01 | Disposition: A | Discharge status: Home / Self Care | Payer: Medicare Other | Source: Ambulatory Visit | Attending: Radiation Oncology | Admitting: Radiation Oncology

## 2014-03-01 VITALS — BP 189/98 | HR 114 | Resp 18

## 2014-03-01 DIAGNOSIS — Z510 Encounter for antineoplastic radiation therapy: Secondary | ICD-10-CM | POA: Diagnosis not present

## 2014-03-01 DIAGNOSIS — R911 Solitary pulmonary nodule: Secondary | ICD-10-CM

## 2014-03-01 NOTE — Progress Notes (Signed)
Called to [facility]. Patient reports upon standing she feels dizzy. Reports she drove herself her today and walked from the parking lot into the building without difficulty. Patient expresses anxiety related today's procedure. Patient denies headache, diplopia, or ringing in the ears. Blood pressure and heart rate elevated. Patient reports taking bp medication this morning and that she has eaten lunch. Informed Dr. Sondra Come of the findings. Therapist advised to assist with ambulation from chair to simulation table.

## 2014-03-03 NOTE — Progress Notes (Signed)
  Radiation Oncology         [phone] ________________________________  Name: Brenda Gardner MRN: [id number]  Date: 03/01/2014  DOB: [birth year]  RESPIRATORY MOTION MANAGEMENT SIMULATION  NARRATIVE:  In order to account for effect of respiratory motion on target structures and other organs in the planning and delivery of radiotherapy, this patient underwent respiratory motion management simulation.  To accomplish this, when the patient was brought to the CT simulation planning suite, 4D respiratoy motion management CT images were obtained.  The CT images were loaded into the planning software.  Then, using a variety of tools including Cine, MIP, and standard views, the target volume and planning target volumes (PTV) were delineated.  Avoidance structures were contoured.  Treatment planning then occurred.  Dose volume histograms were generated and reviewed for each of the requested structure.  The resulting plan was carefully reviewed and approved today.  -----------------------------------  Blair Promise, PhD, MD

## 2014-03-03 NOTE — Progress Notes (Signed)
[facility] Radiation Oncology Simulation and Treatment Planning Note   Name:  @PATNAME @ MRN: [id number]   Date: 03/03/2014  DOB: [birth year]  Status:outpatient    DIAGNOSIS: PET positive pulmonary nodule in the left apical lung    CONSENT VERIFIED:yes   SET UP: Patient is setup supine   IMMOBILIZATION: The patient was immobilized using a Vac Loc bag and Abdominal Compression.   NARRATIVE:The patient was brought to the [facility].  Identity was confirmed.  All relevant records and images related to the planned course of therapy were reviewed.  Then, the patient was positioned in a stable reproducible clinical set-up for radiation therapy. Abdominal compression was applied by me.  4D CT images were obtained and reproducible breathing pattern was confirmed. Free breathing CT images were obtained.  Skin markings were placed.  The CT images were loaded into the planning software where the target and avoidance structures were contoured.  The radiation prescription was entered and confirmed.    TREATMENT PLANNING NOTE:  Treatment planning then occurred. I have requested : MLC's, isodose plan, basic dose calculation.  3 dimensional simulation is performed and dose volume histogram of the gross tumor volume, planning tumor volume and criticial normal structures including the spinal cord and lungs were analyzed and requested.  Special treatment procedure was performed due to high dose per fraction.  The patient will be monitored for increased risk of toxicity.  Daily imaging using cone beam CT will be used for target localization.  -----------------------------------  Blair Promise, PhD, MD

## 2014-03-08 DIAGNOSIS — Z510 Encounter for antineoplastic radiation therapy: Secondary | ICD-10-CM | POA: Diagnosis not present

## 2014-03-11 ENCOUNTER — Telehealth: Payer: Self-pay | Admitting: Internal Medicine

## 2014-03-11 ENCOUNTER — Ambulatory Visit (HOSPITAL_BASED_OUTPATIENT_CLINIC_OR_DEPARTMENT_OTHER): Payer: Medicare Other | Admitting: Internal Medicine

## 2014-03-11 ENCOUNTER — Other Ambulatory Visit (HOSPITAL_BASED_OUTPATIENT_CLINIC_OR_DEPARTMENT_OTHER): Payer: Medicare Other

## 2014-03-11 DIAGNOSIS — C029 Malignant neoplasm of tongue, unspecified: Secondary | ICD-10-CM

## 2014-03-11 DIAGNOSIS — R911 Solitary pulmonary nodule: Secondary | ICD-10-CM

## 2014-03-11 DIAGNOSIS — R634 Abnormal weight loss: Secondary | ICD-10-CM

## 2014-03-11 DIAGNOSIS — F17200 Nicotine dependence, unspecified, uncomplicated: Secondary | ICD-10-CM

## 2014-03-11 DIAGNOSIS — E43 Unspecified severe protein-calorie malnutrition: Secondary | ICD-10-CM

## 2014-03-11 DIAGNOSIS — Z720 Tobacco use: Secondary | ICD-10-CM

## 2014-03-11 LAB — CBC WITH DIFFERENTIAL/PLATELET
BASO%: 0.7 % (ref 0.0–2.0)
Basophils Absolute: 0 10*3/uL (ref 0.0–0.1)
EOS%: 0.4 % (ref 0.0–7.0)
Eosinophils Absolute: 0 10*3/uL (ref 0.0–0.5)
HCT: 42.3 % (ref 34.8–46.6)
HGB: 13.6 g/dL (ref 11.6–15.9)
LYMPH%: 25.5 % (ref 14.0–49.7)
MCH: 31.4 pg (ref 25.1–34.0)
MCHC: 32.3 g/dL (ref 31.5–36.0)
MCV: 97.5 fL (ref 79.5–101.0)
MONO#: 1 10*3/uL — ABNORMAL HIGH (ref 0.1–0.9)
MONO%: 18.9 % — AB (ref 0.0–14.0)
NEUT#: 2.8 10*3/uL (ref 1.5–6.5)
NEUT%: 54.5 % (ref 38.4–76.8)
PLATELETS: 175 10*3/uL (ref 145–400)
RBC: 4.34 10*6/uL (ref 3.70–5.45)
RDW: 14.8 % — ABNORMAL HIGH (ref 11.2–14.5)
WBC: 5.1 10*3/uL (ref 3.9–10.3)
lymph#: 1.3 10*3/uL (ref 0.9–3.3)

## 2014-03-11 LAB — COMPREHENSIVE METABOLIC PANEL (CC13)
ALT: 11 U/L (ref 0–55)
ANION GAP: 12 meq/L — AB (ref 3–11)
AST: 30 U/L (ref 5–34)
Albumin: 3.7 g/dL (ref 3.5–5.0)
Alkaline Phosphatase: 82 U/L (ref 40–150)
BUN: 9.7 mg/dL (ref 7.0–26.0)
CO2: 22 meq/L (ref 22–29)
CREATININE: 0.7 mg/dL (ref 0.6–1.1)
Calcium: 9.4 mg/dL (ref 8.4–10.4)
Chloride: 104 mEq/L (ref 98–109)
Glucose: 115 mg/dl (ref 70–140)
Potassium: 3.6 mEq/L (ref 3.5–5.1)
Sodium: 139 mEq/L (ref 136–145)
Total Bilirubin: 0.73 mg/dL (ref 0.20–1.20)
Total Protein: 7.3 g/dL (ref 6.4–8.3)

## 2014-03-11 LAB — LACTATE DEHYDROGENASE (CC13): LDH: 183 U/L (ref 125–245)

## 2014-03-11 NOTE — Telephone Encounter (Signed)
Gave pt appt for lab and MD  for August 2015

## 2014-03-12 ENCOUNTER — Encounter: Payer: Self-pay | Admitting: Internal Medicine

## 2014-03-12 NOTE — Progress Notes (Signed)
[place] OFFICE PROGRESS NOTE  No PCP Per Patient No address on file  DIAGNOSIS: Hemochromatosis, hereditary - Plan: CBC with Differential, Basic metabolic panel (Bmet) - [facility], Ferritin  Protein-calorie malnutrition, severe  Tongue cancer  Tobacco abuse  Chief Complaint  Patient presents with  . Follow-up   CURRENT THERAPY: Phlebotomy to maintain ferritin less than 50.   INTERVAL HISTORY: Brenda Gardner 80 y.o. female with a history of hypertension, hereditary hemochromatosis, homozygous for the C282Y gene since September 2009 who is here for follow-up.  She was last seen by me on 02/09/2014. She underwent left tongue resection and node removal on 01/07/2014. Post-operative course was not complicated. She has an appointment with Dr. Sondra Come next week. She has undergone simulation.  She is down to 3 cigarettes per day (with meals).   She also reports that her oldest daughter will be moving in with her.   MEDICAL HISTORY: Past Medical History  Diagnosis Date  . Hypertension   . Hemochromatosis   . Complication of anesthesia   . PONV (postoperative nausea and vomiting)     54 YRS AGO DURING CHILDBIRTH  . Lesion of left lung     ONLY DOING RADIATION  . Anxiety     SINCE THIS IS COMING UP  . Cancer   . Arthritis     INTERIM HISTORY: has Hemochromatosis, hereditary; Weight loss; Cough; Nodule of left lung; Hypertension; Tobacco abuse; Tongue cancer; and Protein-calorie malnutrition, severe on her problem list.    ALLERGIES:  is allergic to lisinopril.  MEDICATIONS: has a current medication list which includes the following prescription(s): amlodipine, aspirin, metoprolol succinate, and multivitamin.  SURGICAL HISTORY:  Past Surgical History  Procedure Laterality Date  . Tubal ligation    . Appendectomy    . Tonsillectomy    . Vein ligation Bilateral 1980's  . Eye surgery      BIL CATARACTS  . Glossectomy Left 01/07/2014    Procedure: PARTIAL GLOSSECTOMY;   Surgeon: [name], MD;  Location: [facility];  Service: ENT;  Laterality: Left;  . Radical neck dissection Left 01/07/2014    Procedure: LEFT  NECK DISSECTION WITH FROZEN SECTION;  Surgeon: [name], MD;  Location: [facility];  Service: ENT;  Laterality: Left;   PROBLEM LIST:  1. Hereditary hemochromatosis, homozygous for the C282Y gene with diagnosis established in September 2009 at which time, the ferritin level was 4484. The patient underwent a 1-unit phlebotomy on 05/18/2010 and on 12/07/2010. She underwent a 1/2-unit phlebotomy on 04/22/2012, 09/29/2012, 10/06/2012, and 10/27/2012. The patient now receives 200 mL of normal saline with each half-unit phlebotomy.  2. History of hypertension.  3. Abnormal MRI of the liver showing increased iron deposition within the liver as well as evidence of cirrhosis in October 2009. Hepatomegaly is present on physical exam.  4. Weight loss over 2013. The patient's baseline weight has been running 120 pounds. On 09/29/2012 weight was 106 pounds.  5. Cough started in December of 2013. The patient's cough has resolved.  6. Nodule seen in the apex of the left lung on CT scan of the chest carried out without IV contrast on 11/16/2012.  REVIEW OF SYSTEMS:   Constitutional: Denies fevers, chills or abnormal weight loss Eyes: Denies blurriness of vision Ears, nose, mouth, throat, and face: Denies mucositis or sore throat Respiratory: Denies cough, dyspnea or wheezes Cardiovascular: Denies palpitation, chest discomfort or lower extremity swelling Gastrointestinal:  Denies nausea, heartburn or change in bowel habits Skin: Denies abnormal  skin rashes Lymphatics: Denies new lymphadenopathy or easy bruising Neurological:Denies numbness, tingling or new weaknesses Behavioral/Psych: Mood is stable, no new changes  All other systems were reviewed with the patient and are negative.  PHYSICAL EXAMINATION: ECOG PERFORMANCE STATUS: 1 - Symptomatic but completely  ambulatory  Blood pressure 186/85, pulse 102, temperature 98 F (36.7 C), temperature source Oral, resp. rate 18, height 5\' 6"  (1.676 m), weight 104 lb 9.6 oz (47.446 kg), SpO2 98.00%.  GENERAL:alert, no distress and comfortable; very thin, frail appearing female.  SKIN: skin color, texture, turgor are normal, no rashes or significant lesions; very brittle appearing skin.  EYES: normal, Conjunctiva are pink and non-injected, sclera clear OROPHARYNX:no exudate, no erythema and lips, buccal mucosa, and s/p left tongue resection.  NECK: supple, thyroid normal size, non-tender, without nodularity; s/p surgery with scar well healed.  LYMPH:  no palpable lymphadenopathy in the cervical, axillary or supraclavicular LUNGS: clear to auscultation with normal breathing effort with a prolonged expiratory phase HEART: regular rate & rhythm and no murmurs and no lower extremity edema ABDOMEN:abdomen soft, non-tender and normal bowel sounds Musculoskeletal:no cyanosis of digits and no clubbing  NEURO: alert & oriented x 3 with fluent speech, no focal motor/sensory deficits; cautious gait.    Labs:  Lab Results  Component Value Date   WBC 5.1 03/11/2014   HGB 13.6 03/11/2014   HCT 42.3 03/11/2014   MCV 97.5 03/11/2014   PLT 175 03/11/2014   NEUTROABS 2.8 03/11/2014      Chemistry      Component Value Date/Time   NA 139 03/11/2014 1500   NA 140 12/30/2013 1430   K 3.6 03/11/2014 1500   K 4.4 12/30/2013 1430   CL 99 12/30/2013 1430   CL 103 02/05/2013 1136   CO2 22 03/11/2014 1500   CO2 26 12/30/2013 1430   BUN 9.7 03/11/2014 1500   BUN 11 12/30/2013 1430   CREATININE 0.7 03/11/2014 1500   CREATININE 0.71 12/30/2013 1430      Component Value Date/Time   CALCIUM 9.4 03/11/2014 1500   CALCIUM 9.5 12/30/2013 1430   ALKPHOS 82 03/11/2014 1500   ALKPHOS 101 12/30/2013 1430   AST 30 03/11/2014 1500   AST 40* 12/30/2013 1430   ALT 11 03/11/2014 1500   ALT 14 12/30/2013 1430   BILITOT 0.73 03/11/2014 1500   BILITOT  0.5 12/30/2013 1430     Iron/TIBC/Ferritin    Component Value Date/Time   IRON 47 01/12/2014 1435   IRON 95 02/05/2013 1136   TIBC 249 01/12/2014 1435   TIBC 293 02/05/2013 1136   FERRITIN 34 02/09/2014 1315   FERRITIN 33 12/15/2012 1004    CBC:  Recent Labs Lab 03/11/14 1500  WBC 5.1  NEUTROABS 2.8  HGB 13.6  HCT 42.3  MCV 97.5  PLT 175   Studies:  No results found.  RADIOGRAPHIC STUDIES: 1. MRI of the abdomen with and without IV contrast on 06/05/2008 showed marked iron deposition within the liver with sparing of the spleen consistent with genetic hemochromatosis. There was atrophy of the left lateral hepatic lobe with mild linear enhancement. This was favored to be most consistent with cirrhosis rather than hepatocellular carcinoma. There was mild iron deposition within the pancreas.  2. Complete abdominal ultrasound of the abdomen on 05/09/2009 showed no gallstones within the gallbladder and a normal common bile duct. There was heterogeneous echotexture of the liver, felt to be due to hepatitis or cirrhosis. There was no focal hepatic mass. No hydronephrosis  or renal calculi.  3. Chest x-ray, 2 view, from 07/27/2009 showed mild cardiomegaly with no acute findings.  4. MRI of the abdomen with and without IV contrast on 04/28/2012 showed a decrease in the apparent degree of iron deposition within the nodular appearing liver compatible with improvement and secondary evidence of hemochromatosis. There were no enhancing lesions to suggest hepatocellular carcinoma. There was mild pancreatic iron deposition. There was evidence of left hepatic lobe atrophy with probable internal fibrosis/scarring. The study was compared with the prior MRI from 06/05/2008 and the previous ultrasound from 05/09/2009. Consideration of annual MRI surveillance was stated.  5. Chest x-ray, 2 view, from 11/05/2012 showed COPD. There was a possible sub-centimeter nodule in the left upper lobe. Chest CT scan was  recommended. Chest x-ray was compared with the previous chest x-ray from 07/27/2009.  6. CT scan of the chest without IV contrast from 11/16/2012 showed a 10 x 7 mm left apical lesion suspicious for neoplasm. Scarring could not be excluded. There were underlying emphysematous changes without mediastinal or hilar lymphadenopathy. There were moderate atherosclerotic calcifications involving the aorta and branch vessels.  7. CT scan of the chest without IV contrast on 02/01/2013 showed stable appearance of the 7 x 10-mm spiculated nodule in the anterior left upper lobe. A PET CT scan was suggested.  8. CT scan of the chest without IV contrast on 10/08/2013 showed further increase in size of left apical nodule which is concerning for primary neoplasm of the lung and a calcified atherosclerotic disease including multi vessel coronary artery calcifications. 9. PET scan on 12/09/2013.  This demonstrated an enlarging left apical lung mass with neoplastic range FDG uptake increased since the prior PET-CT. No findings for mediastinal or hilar lymphadenopathy or pulmonary metastasis. 19 x 12 mm soft tissue mass associated with the eft aspect of the tongue.  This is metabolically active and consistent with neoplasm. No neck adenopathy.   ASSESSMENT: Araceli Bouche 80 y.o. female with a history of Hemochromatosis, hereditary - Plan: CBC with Differential, Basic metabolic panel (Bmet) - [facility], Ferritin  Protein-calorie malnutrition, severe  Tongue cancer  Tobacco abuse  PLAN:   1. Hemochromatosis.  --She continues to do well overall from this standpoint.   Her ferritin on 02/09/2014 was 34.  Ferritin is pending for today.    2. Hypertension. --She was counseled to avoid high salt diet. She reports that her blood pressures are better controlled at home.   3. Nodule of left lung concerning for primary neoplasm of the lung. --Patient reported that she will schedule a follow-up for her Left lung nodule previously  with Dr. Roxan Hockey.  Repeat CT of chest without contrast to assess for growth showed further increase in size of left apical nodule which is concerning for primary neoplasm of the lung.  A referral was made to Cardio-thoracic surgery. She declined surgery and subsequent referral for biopsy.   She has a follow up with radiation oncology (Dr. Sondra Come) and has undergone XRT simulation.   4. Tongue, SCC s/p partial resection (pT2,N0). --She is being followed by ENT.  Lymph nodes are negative. Likely to start treatment for number 3.     5. Weight lost. --I counseled her to continue her ensure to 2-3 cans daily. Maximize nutrition to help with healing.   6. Tobacco Abuse. -- Patient was counseled to continue to stop her smoking.   She did elect for referral to tobacco cessation classes.   7.  Follow-up. --Patient will return to our  office in 4 weeks for repeat labs including CBC, CMET, iron studies and ferritin.   All questions were answered. The patient knows to call the clinic with any problems, questions or concerns. We can certainly see the patient much sooner if necessary.  I spent 15 minutes counseling the patient face to face. The total time spent in the appointment was 25 minutes.    Chelly Dombeck, MD 03/12/2014 6:04 PM

## 2014-03-14 DIAGNOSIS — Z510 Encounter for antineoplastic radiation therapy: Secondary | ICD-10-CM | POA: Diagnosis not present

## 2014-03-14 LAB — IRON AND TIBC CHCC
%SAT: 24 % (ref 21–57)
Iron: 60 ug/dL (ref 41–142)
TIBC: 249 ug/dL (ref 236–444)
UIBC: 189 ug/dL (ref 120–384)

## 2014-03-14 LAB — FERRITIN CHCC: Ferritin: 26 ng/ml (ref 9–269)

## 2014-03-15 ENCOUNTER — Ambulatory Visit
Admission: RE | Admit: 2014-03-15 | Discharge: 2014-03-15 | Disposition: A | Discharge status: Home / Self Care | Payer: Medicare Other | Source: Ambulatory Visit | Attending: Radiation Oncology | Admitting: Radiation Oncology

## 2014-03-15 ENCOUNTER — Encounter: Payer: Self-pay | Admitting: Radiation Oncology

## 2014-03-15 VITALS — BP 154/80 | HR 90 | Temp 98.1°F | Ht 66.0 in | Wt 104.5 lb

## 2014-03-15 DIAGNOSIS — Z510 Encounter for antineoplastic radiation therapy: Secondary | ICD-10-CM | POA: Diagnosis not present

## 2014-03-15 DIAGNOSIS — R911 Solitary pulmonary nodule: Secondary | ICD-10-CM

## 2014-03-15 NOTE — Progress Notes (Signed)
  Radiation Oncology         [phone] ________________________________  Name: Brenda Gardner MRN: [id number]  Date: 03/15/2014  DOB: 1933/08/25  Weekly Radiation Therapy Management  Diagnosis: PET positive pulmonary nodule in the left apical lung    Current Dose: 5 Gy     Planned Dose:  50 Gy  Narrative . . . . . . . . The patient presents for routine under treatment assessment.                                   The patient is without complaint.                                 Set-up films were reviewed.                                 The chart was checked. Physical Findings. . .  height is 5\' 6"  (1.676 m) and weight is 104 lb 8 oz (47.401 kg). Her oral temperature is 98.1 F (36.7 C). Her blood pressure is 154/80 and her pulse is 90. Her oxygen saturation is 98%. . The lungs are clear. The heart has a regular rhythm and rate Impression . . . . . . . The patient is tolerating radiation. Plan . . . . . . . . . . . . Continue treatment as planned.  ________________________________   Blair Promise, PhD, MD

## 2014-03-15 NOTE — Progress Notes (Signed)
Brenda Gardner has completed 1 fraction to her left upper lung.  She denies pain.  She reports feeling like she does not want to do anything for the past couple of days.  She denies a cough and hemoptysis.  She was given the Radiation Therapy and You book to review.

## 2014-03-16 ENCOUNTER — Ambulatory Visit
Admission: RE | Admit: 2014-03-16 | Discharge: 2014-03-16 | Disposition: A | Discharge status: Home / Self Care | Payer: Medicare Other | Source: Ambulatory Visit | Attending: Radiation Oncology | Admitting: Radiation Oncology

## 2014-03-16 DIAGNOSIS — R911 Solitary pulmonary nodule: Secondary | ICD-10-CM

## 2014-03-16 DIAGNOSIS — Z510 Encounter for antineoplastic radiation therapy: Secondary | ICD-10-CM | POA: Diagnosis not present

## 2014-03-17 ENCOUNTER — Ambulatory Visit
Admission: RE | Admit: 2014-03-17 | Discharge: 2014-03-17 | Disposition: A | Discharge status: Home / Self Care | Payer: Medicare Other | Source: Ambulatory Visit | Attending: Radiation Oncology | Admitting: Radiation Oncology

## 2014-03-17 DIAGNOSIS — R911 Solitary pulmonary nodule: Secondary | ICD-10-CM

## 2014-03-17 DIAGNOSIS — Z510 Encounter for antineoplastic radiation therapy: Secondary | ICD-10-CM | POA: Diagnosis not present

## 2014-03-18 ENCOUNTER — Ambulatory Visit
Admission: RE | Admit: 2014-03-18 | Discharge: 2014-03-18 | Disposition: A | Discharge status: Home / Self Care | Payer: Medicare Other | Source: Ambulatory Visit | Attending: Radiation Oncology | Admitting: Radiation Oncology

## 2014-03-18 DIAGNOSIS — Z510 Encounter for antineoplastic radiation therapy: Secondary | ICD-10-CM | POA: Diagnosis not present

## 2014-03-18 NOTE — Progress Notes (Signed)
Reviewed side effects/management of fatigue and skin changes with patient.  Advised her to call with any questions or concerns.  She verbalized agreement.

## 2014-03-21 ENCOUNTER — Encounter: Payer: Self-pay | Admitting: Radiation Oncology

## 2014-03-21 ENCOUNTER — Ambulatory Visit
Admission: RE | Admit: 2014-03-21 | Discharge: 2014-03-21 | Disposition: A | Discharge status: Home / Self Care | Payer: Medicare Other | Source: Ambulatory Visit | Attending: Radiation Oncology | Admitting: Radiation Oncology

## 2014-03-21 DIAGNOSIS — Z510 Encounter for antineoplastic radiation therapy: Secondary | ICD-10-CM | POA: Diagnosis not present

## 2014-03-21 DIAGNOSIS — R911 Solitary pulmonary nodule: Secondary | ICD-10-CM

## 2014-03-22 ENCOUNTER — Ambulatory Visit
Admission: RE | Admit: 2014-03-22 | Discharge: 2014-03-22 | Disposition: A | Discharge status: Home / Self Care | Payer: Medicare Other | Source: Ambulatory Visit | Attending: Radiation Oncology | Admitting: Radiation Oncology

## 2014-03-22 DIAGNOSIS — R911 Solitary pulmonary nodule: Secondary | ICD-10-CM

## 2014-03-22 DIAGNOSIS — Z510 Encounter for antineoplastic radiation therapy: Secondary | ICD-10-CM | POA: Diagnosis not present

## 2014-03-22 NOTE — Progress Notes (Signed)
Weekly rad txs LUL 6/10 txs completd, manual b/p=170/100, after taking b/p on dinamap of 180/104, has dry cough, nopain or nuasea, no difficulty swallowing, no skin changes on chest, skin is thin, dry appetite good, energy level bad stated patient 97% room air,  sats no c/o sob 2:02 PM

## 2014-03-22 NOTE — Progress Notes (Signed)
  Radiation Oncology         [phone] ________________________________  Name: Brenda Gardner MRN: [id number]  Date: 03/22/2014  DOB: 04-Feb-1933  Weekly Radiation Therapy Management  DIAGNOSIS: PET positive pulmonary nodule in the left apical lung    Current Dose: 30 Gy     Planned Dose:  50 Gy  Narrative . . . . . . . . The patient presents for routine under treatment assessment.                                   The patient is without complaint.  She complains of chronic fatigue.                                 Set-up films were reviewed.                                 The chart was checked. Physical Findings. . .  vitals were not taken for this visit.. Weight essentially stable. The lungs are clear. The heart has a regular rhythm and rate. The abdomen is soft and nontender with normal bowel sounds. Impression . . . . . . . The patient is tolerating radiation. Plan . . . . . . . . . . . . Continue treatment as planned.  ________________________________   Blair Promise, PhD, MD

## 2014-03-23 ENCOUNTER — Ambulatory Visit
Admission: RE | Admit: 2014-03-23 | Discharge: 2014-03-23 | Disposition: A | Discharge status: Home / Self Care | Payer: Medicare Other | Source: Ambulatory Visit | Attending: Radiation Oncology | Admitting: Radiation Oncology

## 2014-03-23 DIAGNOSIS — Z510 Encounter for antineoplastic radiation therapy: Secondary | ICD-10-CM | POA: Diagnosis not present

## 2014-03-23 DIAGNOSIS — R911 Solitary pulmonary nodule: Secondary | ICD-10-CM

## 2014-03-24 ENCOUNTER — Ambulatory Visit
Admission: RE | Admit: 2014-03-24 | Discharge: 2014-03-24 | Disposition: A | Discharge status: Home / Self Care | Payer: Medicare Other | Source: Ambulatory Visit | Attending: Radiation Oncology | Admitting: Radiation Oncology

## 2014-03-24 DIAGNOSIS — Z510 Encounter for antineoplastic radiation therapy: Secondary | ICD-10-CM | POA: Diagnosis not present

## 2014-03-24 DIAGNOSIS — R911 Solitary pulmonary nodule: Secondary | ICD-10-CM

## 2014-03-25 ENCOUNTER — Ambulatory Visit
Admission: RE | Admit: 2014-03-25 | Discharge: 2014-03-25 | Disposition: A | Discharge status: Home / Self Care | Payer: Medicare Other | Source: Ambulatory Visit | Attending: Radiation Oncology | Admitting: Radiation Oncology

## 2014-03-25 DIAGNOSIS — Z510 Encounter for antineoplastic radiation therapy: Secondary | ICD-10-CM | POA: Diagnosis not present

## 2014-03-28 ENCOUNTER — Ambulatory Visit
Admission: RE | Admit: 2014-03-28 | Discharge: 2014-03-28 | Disposition: A | Discharge status: Home / Self Care | Payer: Medicare Other | Source: Ambulatory Visit | Attending: Radiation Oncology | Admitting: Radiation Oncology

## 2014-03-28 DIAGNOSIS — Z510 Encounter for antineoplastic radiation therapy: Secondary | ICD-10-CM | POA: Diagnosis not present

## 2014-03-28 DIAGNOSIS — R911 Solitary pulmonary nodule: Secondary | ICD-10-CM

## 2014-03-31 ENCOUNTER — Telehealth: Payer: Self-pay | Admitting: Internal Medicine

## 2014-04-07 ENCOUNTER — Ambulatory Visit: Payer: Medicare Other

## 2014-04-07 ENCOUNTER — Other Ambulatory Visit: Payer: Medicare Other

## 2014-04-10 ENCOUNTER — Encounter: Payer: Self-pay | Admitting: Radiation Oncology

## 2014-04-10 NOTE — Progress Notes (Signed)
  Radiation Oncology         [phone] ________________________________  Name: Brenda Gardner MRN: [id number]  Date: 04/10/2014  DOB: 1933/07/13  End of Treatment Note  Diagnosis:    PET positive pulmonary nodule in the left apical lung     Indication for treatment:  Definitive treatment       Radiation treatment dates:   July 14 through July 27  Site/dose:   Left apical nodule 50 gray in 10 fractions  Beams/energy:   Intensity modulated radiation therapy, volumetric ARC therapy using 2 rapid arcs, 6 MV photons  Narrative: The patient tolerated radiation treatment relatively well.   She did have some mild fatigue during the course of treatment  Plan: The patient has completed radiation treatment. The patient will return to radiation oncology clinic for routine followup in one month. I advised them to call or return sooner if they have any questions or concerns related to their recovery or treatment.  -----------------------------------  Blair Promise, PhD, MD

## 2014-04-21 ENCOUNTER — Ambulatory Visit: Payer: Medicare Other

## 2014-04-21 ENCOUNTER — Other Ambulatory Visit: Payer: Medicare Other

## 2015-03-22 ENCOUNTER — Encounter: Payer: Self-pay | Admitting: Oncology

## 2015-03-23 ENCOUNTER — Telehealth: Payer: Self-pay | Admitting: *Deleted

## 2015-03-23 ENCOUNTER — Encounter: Payer: Self-pay | Admitting: Radiation Oncology

## 2015-03-23 ENCOUNTER — Ambulatory Visit
Admission: RE | Admit: 2015-03-23 | Discharge: 2015-03-23 | Disposition: A | Discharge status: Home / Self Care | Payer: Medicare Other | Source: Ambulatory Visit | Attending: Radiation Oncology | Admitting: Radiation Oncology

## 2015-03-23 VITALS — BP 186/102 | HR 87 | Temp 97.9°F | Resp 20 | Ht 66.0 in | Wt 101.9 lb

## 2015-03-23 DIAGNOSIS — R911 Solitary pulmonary nodule: Secondary | ICD-10-CM

## 2015-03-23 NOTE — Progress Notes (Addendum)
Brenda Gardner here for follow up.  She denies pain.  She reports that her legs have been shaking for the past year.  She has a noticeable tremor.  She reports fatigue with activity and says trips to the grocery store "wipe her out."  She denies shortness of breath, cough and hemoptysis.  She denies skin irritation.  Her BP and hr today are high today at 213/116 and 111.  She has not been taking her Norvasc or lopressor.  She reports her appetite is ok.  She continues to smoke 3-4 cigarettes per day.  BP 213/116 mmHg  Pulse 111  Temp(Src) 97.9 F (36.6 C) (Oral)  Resp 20  Ht '5\' 6"'$  (1.676 m)  Wt 101 lb 14.4 oz (46.222 kg)  BMI 16.46 kg/m2  SpO2 98%   Wt Readings from Last 3 Encounters:  03/23/15 101 lb 14.4 oz (46.222 kg)  03/15/14 104 lb 8 oz (47.401 kg)  03/11/14 104 lb 9.6 oz (47.446 kg)

## 2015-03-23 NOTE — Progress Notes (Signed)
Radiation Oncology         [phone] ________________________________  Name: Brenda Gardner MRN: [id number]  Date: 03/23/2015  DOB: [birth year]  Follow-Up Visit Note  CC: No PCP Per Patient  No ref. provider found   Diagnosis:  Brenda Gardner is a 81 year old female presenting to clinic in regards to her PET positive pulmonary nodule in the left apical lung.  Interval Since Last Radiation:  12 months  Narrative:  The patient returns today for routine follow-up in regards to the management of her PET positive pulmonary nodule in the left apical lung. She reports that her legs have been shaking for the past year. She reports fatigue with activity and says trips to the grocery store "wipe her out."  She denies shortness of breath, cough, hemoptysis, skin irritation or pain.  Her blood pressure is elevated today at 213/116 and 111!  She has not been taking her Norvasc or Lopressor medications, yet reports her appetite is healthy and stable. The patient expressed having transportation issues, which has kept her from making appointments with Dr. Constance Holster. Dr. Constance Holster removed a part of the patient's tongue and lymph nodes. The patient reported that her daughter moved into her current place of residency as of September 2015, to help care for her. Dentures were removed to provide a more accurate physical exam during today's visit. There is a prevalent tremor on the left side of the patient's face and left leg. Her skin is sensitive (ticklish) and her gag reflex is extremely sensitive.                 ALLERGIES:  is allergic to lisinopril.  Meds: Current Outpatient Prescriptions  Medication Sig Dispense Refill  . aspirin 325 MG EC tablet Take 325 mg by mouth every 6 (six) hours as needed for pain or fever.    . Multiple Vitamin (MULTIVITAMIN) tablet Take 1 tablet by mouth daily.    Marland Kitchen amLODipine (NORVASC) 2.5 MG tablet Take 2.5 mg by mouth daily.    . metoprolol succinate (TOPROL-XL) 25 MG 24 hr tablet  Take 25 mg by mouth daily.     No current facility-administered medications for this encounter.    Physical Findings: The patient is in no acute distress. Patient is alert and oriented X3.  height is '5\' 6"'$  (1.676 m) and weight is 101 lb 14.4 oz (46.222 kg). Her oral temperature is 97.9 F (36.6 C). Her blood pressure is 186/102 and her pulse is 87. Her respiration is 20 and oxygen saturation is 98%. . The lungs are clear to auscultation. The heart has a regular rhythm and rate. Examination of the neck supraclavicular and axillary areas reveals no palpable adenopathy. Examination of oral cavity reveals poor dentition with several teeth missing. Careful examination of the oral cavity and tongue area reveals no visible or palpable signs of recurrence.   Lab Findings: Lab Results  Component Value Date   WBC 5.1 03/11/2014   HGB 13.6 03/11/2014   HCT 42.3 03/11/2014   MCV 97.5 03/11/2014   PLT 175 03/11/2014    Radiographic Findings: No results found.  Impression: Brenda Gardner is a 81 year old female presenting to clinic in regards to her PET positive pulmonary nodule in the left apical lung. She has canceled her follow-ups with me and is therefore been 1 year since she completed her therapy with no imaging. Clinically she seems to be doing well concerning her lung lesion. She will be set up for a  chest CT scan in the near future  Plan: The patient has been advised of her follow up appointment with radiation oncology in 3-4 months. The patient was made aware of a CT scan of the chest to be scheduled in regards to her lung treatment. The difference between an MRI and CT scan was addressed with the patient, as she was originally confused about the difference. If she develops any further questions or concerns, she was encouraged to contact Dr. Sondra Come, PhD, MD. have encouraged patient to follow-up with Dr. Constance Holster concerning her tongue cancer.   This document serves as a record of services personally  performed by Gery Pray, MD. It was created on his behalf by Lenn Cal, a trained medical scribe. The creation of this record is based on the scribe's personal observations and the provider's statements to them. This document has been checked and approved by the attending provider.    ________________________________   Blair Promise, PhD, MD

## 2015-03-23 NOTE — Telephone Encounter (Signed)
CALLED PATIENT TO INFORM OF TEST FOR 03-28-15- ARRIVAL  TIME - 1:15 PM @ [facility]- SPOKE WITH PATIENT AND SHE IS AWARE OF THIS TEST AND SHE VERBALIZED UNDERSTANDING  THIS.

## 2015-03-28 ENCOUNTER — Ambulatory Visit (HOSPITAL_COMMUNITY)
Admission: RE | Admit: 2015-03-28 | Discharge: 2015-03-28 | Disposition: A | Discharge status: Home / Self Care | Payer: Medicare Other | Source: Ambulatory Visit | Attending: Radiation Oncology | Admitting: Radiation Oncology

## 2015-03-28 DIAGNOSIS — C7800 Secondary malignant neoplasm of unspecified lung: Secondary | ICD-10-CM | POA: Diagnosis not present

## 2015-03-28 DIAGNOSIS — I712 Thoracic aortic aneurysm, without rupture: Secondary | ICD-10-CM | POA: Diagnosis not present

## 2015-03-28 DIAGNOSIS — R0602 Shortness of breath: Secondary | ICD-10-CM | POA: Insufficient documentation

## 2015-03-28 DIAGNOSIS — C029 Malignant neoplasm of tongue, unspecified: Secondary | ICD-10-CM | POA: Insufficient documentation

## 2015-03-28 DIAGNOSIS — Z923 Personal history of irradiation: Secondary | ICD-10-CM | POA: Diagnosis not present

## 2015-03-28 DIAGNOSIS — J439 Emphysema, unspecified: Secondary | ICD-10-CM | POA: Diagnosis not present

## 2015-03-28 DIAGNOSIS — R911 Solitary pulmonary nodule: Secondary | ICD-10-CM

## 2015-07-20 ENCOUNTER — Ambulatory Visit: Admission: RE | Admit: 2015-07-20 | Payer: Medicare Other | Source: Ambulatory Visit | Admitting: Radiation Oncology

## 2015-08-24 ENCOUNTER — Ambulatory Visit: Payer: Medicare Other | Admitting: Radiation Oncology

## 2015-10-05 ENCOUNTER — Ambulatory Visit: Payer: Medicare Other | Admitting: Radiation Oncology

## 2015-11-16 ENCOUNTER — Ambulatory Visit
Admission: RE | Admit: 2015-11-16 | Discharge: 2015-11-16 | Disposition: A | Discharge status: Home / Self Care | Payer: Medicare Other | Source: Ambulatory Visit | Attending: Radiation Oncology | Admitting: Radiation Oncology

## 2015-11-16 ENCOUNTER — Encounter: Payer: Self-pay | Admitting: Radiation Oncology

## 2015-11-16 VITALS — BP 189/101 | HR 91 | Temp 97.6°F | Ht 66.0 in | Wt 103.8 lb

## 2015-11-16 DIAGNOSIS — R911 Solitary pulmonary nodule: Secondary | ICD-10-CM

## 2015-11-16 NOTE — Progress Notes (Signed)
  Radiation Oncology         [phone] ________________________________  Name: Brenda Gardner MRN: [id number]  Date: 11/16/2015  DOB: 07/18/1933  Follow-Up Visit Note  CC: No PCP Per Patient  Brenda Gala, MD  Diagnosis:  PET positive pulmonary nodule in the left apical lung  Interval Since Last Radiation:  19 months; 03/15/2014- 03/28/2014  Narrative:  The patient returns today for routine follow-up. I last saw her on 03/23/2015.  States that she woke up one morning 5 days ago with left eye redness. She reports that this issue is improving. She denies any vision changes. States that it feels as if "sand is in her eye." Denies any coughing or breathing issues. She did, however, report having an occasional cough to the RN. She denies having pain or SOB. She denies having fatigue. Her bp is elevated today at 189/101. She said she has not had her blood pressure medication refilled. She used to have Dr. Juliann Gardner refill them.  ALLERGIES:  is allergic to lisinopril.  Meds: Current Outpatient Prescriptions  Medication Sig Dispense Refill  . amLODipine (NORVASC) 2.5 MG tablet Take 2.5 mg by mouth daily. Reported on 11/16/2015    . aspirin 325 MG EC tablet Take 325 mg by mouth every 6 (six) hours as needed for pain or fever. Reported on 11/16/2015    . metoprolol succinate (TOPROL-XL) 25 MG 24 hr tablet Take 25 mg by mouth daily. Reported on 11/16/2015    . Multiple Vitamin (MULTIVITAMIN) tablet Take 1 tablet by mouth daily. Reported on 11/16/2015     No current facility-administered medications for this encounter.    Physical Findings: The patient is in no acute distress. Patient is alert and oriented X3.  height is '5\' 6"'$  (1.676 m) and weight is 103 lb 12.8 oz (47.083 kg). Her oral temperature is 97.6 F (36.4 C). Her blood pressure is 189/101 and her pulse is 91. Her oxygen saturation is 98%.    No palpable subclavicular adenopathy. The lungs are clear to auscultation. The heart has regular  rhythm and rate. She is extremely ticklish/sensitive to touch.   Lab Findings: Lab Results  Component Value Date   WBC 5.1 03/11/2014   HGB 13.6 03/11/2014   HCT 42.3 03/11/2014   MCV 97.5 03/11/2014   PLT 175 03/11/2014    Radiographic Findings: No results found.  Impression: Brenda Gardner is a 82 year old female here for follow up regarding a PET positive pulmonary nodule in the left apical lung, Status post hypo-fractionated radiation treatment (50 gray in 10 fractions)  Plan: I would like to have a CT scan completed. I will follow up with her in 6 months. We will call the patient to schedule this appointment.    ________________________________   Blair Promise, PhD, MD  This document serves as a record of services personally performed by Gery Pray, MD. It was created on his behalf by Jenell Milliner, a trained medical scribe. The creation of this record is based on the scribe's personal observations and the provider's statements to them. This document has been checked and approved by the attending provider.

## 2015-11-16 NOTE — Progress Notes (Addendum)
Julieta Gutting here for follow up.  She denies having pain, shortness of breath.  She reports having an occasional cough.  She denies having fatigue.  Her bp is elevated today at 189/101.  She said she has not had her blood pressure medication refilled.  She used to have Dr. Juliann Mule refill them.  BP 189/101 mmHg  Pulse 91  Temp(Src) 97.6 F (36.4 C) (Oral)  Ht '5\' 6"'$  (1.676 m)  Wt 103 lb 12.8 oz (47.083 kg)  BMI 16.76 kg/m2  SpO2 98%   Wt Readings from Last 3 Encounters:  11/16/15 103 lb 12.8 oz (47.083 kg)  03/23/15 101 lb 14.4 oz (46.222 kg)  03/15/14 104 lb 8 oz (47.401 kg)

## 2015-11-17 ENCOUNTER — Telehealth: Payer: Self-pay | Admitting: *Deleted

## 2015-11-17 NOTE — Telephone Encounter (Signed)
CALLED PATIENT TO INFORM OF CT ON 11-22-15 - ARRIVAL TIME - 12:15PM @ [facility], SPOKE WITH PATIENT AND SHE IS AWARE OF THIS TEST

## 2015-11-22 ENCOUNTER — Ambulatory Visit (HOSPITAL_COMMUNITY): Payer: Medicare Other

## 2015-11-27 ENCOUNTER — Ambulatory Visit (HOSPITAL_COMMUNITY)
Admission: RE | Admit: 2015-11-27 | Discharge: 2015-11-27 | Disposition: A | Discharge status: Home / Self Care | Payer: Medicare Other | Source: Ambulatory Visit | Attending: Radiation Oncology | Admitting: Radiation Oncology

## 2015-11-27 DIAGNOSIS — R911 Solitary pulmonary nodule: Secondary | ICD-10-CM | POA: Diagnosis not present

## 2015-11-27 DIAGNOSIS — I77810 Thoracic aortic ectasia: Secondary | ICD-10-CM | POA: Diagnosis not present

## 2015-11-27 DIAGNOSIS — K7460 Unspecified cirrhosis of liver: Secondary | ICD-10-CM | POA: Diagnosis not present

## 2015-11-27 DIAGNOSIS — I2510 Atherosclerotic heart disease of native coronary artery without angina pectoris: Secondary | ICD-10-CM | POA: Insufficient documentation

## 2015-11-27 DIAGNOSIS — Z923 Personal history of irradiation: Secondary | ICD-10-CM | POA: Insufficient documentation

## 2015-11-27 DIAGNOSIS — Z85118 Personal history of other malignant neoplasm of bronchus and lung: Secondary | ICD-10-CM | POA: Insufficient documentation

## 2016-05-22 ENCOUNTER — Ambulatory Visit (HOSPITAL_COMMUNITY): Payer: Medicare Other

## 2016-05-23 ENCOUNTER — Ambulatory Visit: Payer: Medicare Other | Admitting: Radiation Oncology

## 2016-06-13 ENCOUNTER — Ambulatory Visit
Admission: RE | Admit: 2016-06-13 | Discharge: 2016-06-13 | Disposition: A | Discharge status: Home / Self Care | Payer: Medicare Other | Source: Ambulatory Visit | Attending: Radiation Oncology | Admitting: Radiation Oncology

## 2016-06-13 ENCOUNTER — Telehealth: Payer: Self-pay | Admitting: *Deleted

## 2016-06-13 ENCOUNTER — Encounter: Payer: Self-pay | Admitting: Radiation Oncology

## 2016-06-13 DIAGNOSIS — Z79899 Other long term (current) drug therapy: Secondary | ICD-10-CM | POA: Insufficient documentation

## 2016-06-13 DIAGNOSIS — R911 Solitary pulmonary nodule: Secondary | ICD-10-CM | POA: Diagnosis present

## 2016-06-13 DIAGNOSIS — Z923 Personal history of irradiation: Secondary | ICD-10-CM | POA: Insufficient documentation

## 2016-06-13 DIAGNOSIS — Z888 Allergy status to other drugs, medicaments and biological substances status: Secondary | ICD-10-CM | POA: Insufficient documentation

## 2016-06-13 DIAGNOSIS — R251 Tremor, unspecified: Secondary | ICD-10-CM | POA: Diagnosis not present

## 2016-06-13 MED ORDER — AMLODIPINE BESYLATE 2.5 MG PO TABS: 2.5000 mg | ORAL_TABLET | Freq: Every day | ORAL | 0 refills | Status: DC

## 2016-06-13 MED ORDER — METOPROLOL SUCCINATE ER 25 MG PO TB24: 25.0000 mg | ORAL_TABLET | Freq: Every day | ORAL | 0 refills | Status: DC

## 2016-06-13 NOTE — Progress Notes (Signed)
  Radiation Oncology         [phone] ________________________________  Name: Brenda Gardner MRN: [id number]  Date: 06/13/2016  DOB: 01-Aug-1933  Follow-Up Visit Note  CC: No PCP Per Patient  Brenda Gala, MD  Diagnosis:  PET positive pulmonary nodule in the left apical lung  Interval Since Last Radiation:  2 years, 3 months, 2 weeks  03/15/2014- 03/28/2014  Narrative:  The patient returns today for routine follow-up. I last saw her on 03/23/2015.  She reports having more tremors, especially with activity.She denies having shortness of breath, cough, chest pain, and fatigue. She reports having a good appetite. Her BP is high at 192/118. She reports that she has not been to see any doctors in a while and is out of her BP medications. She also canceled her CT scan on 05/22/16 due to not having transportation.  ALLERGIES:  is allergic to lisinopril.  Meds: Current Outpatient Prescriptions  Medication Sig Dispense Refill  . amLODipine (NORVASC) 2.5 MG tablet Take 1 tablet (2.5 mg total) by mouth daily. Reported on 11/16/2015 30 tablet 0  . aspirin 325 MG EC tablet Take 325 mg by mouth every 6 (six) hours as needed for pain or fever. Reported on 11/16/2015    . metoprolol succinate (TOPROL-XL) 25 MG 24 hr tablet Take 1 tablet (25 mg total) by mouth daily. Reported on 11/16/2015 30 tablet 0  . Multiple Vitamin (MULTIVITAMIN) tablet Take 1 tablet by mouth daily. Reported on 11/16/2015     No current facility-administered medications for this encounter.     Physical Findings: The patient is in no acute distress. Patient is alert and oriented X3.  height is '5\' 6"'$  (1.676 m) and weight is 98 lb 3.2 oz (44.5 kg). Her oral temperature is 97.5 F (36.4 C). Her blood pressure is 192/118 (abnormal) and her pulse is 96. Her oxygen saturation is 98%.    No palpable subclavicular adenopathy. The lungs are clear to auscultation. The heart has regular rhythm and rate. She is extremely ticklish/sensitive to  touch.   Lab Findings: Lab Results  Component Value Date   WBC 5.1 03/11/2014   HGB 13.6 03/11/2014   HCT 42.3 03/11/2014   MCV 97.5 03/11/2014   PLT 175 03/11/2014    Radiographic Findings: No results found.  Impression: Brenda Gardner is a 82 year old female here for follow up regarding a PET positive pulmonary nodule in the left apical lung, Status post hypo-fractionated radiation treatment (50 gray in 10 fractions). Patient is doing well. No clinical signs of recurrence.  Plan: Schedule a chest CT scan without contrast. I will follow up with her in 6 months. We will call the patient to schedule this appointment.  patient blood pressure medication refilled today in light of sign. elevation. Advised patient she will need to get future refills filled by a primary care physician.   ________________________________   Blair Promise, PhD, MD  This document serves as a record of services personally performed by Gery Pray, MD. It was created on his behalf by Bethann Humble, a trained medical scribe. The creation of this record is based on the scribe's personal observations and the provider's statements to them. This document has been checked and approved by the attending provider.

## 2016-06-13 NOTE — Telephone Encounter (Signed)
XXXX 

## 2016-06-13 NOTE — Progress Notes (Addendum)
Brenda Gardner here for follow up.  She reports feeling having more tremors, especially with activity.  She denies having shortness of breath, cough and fatigue.  She reports having a good appetite.  Her bp is high at 192/118.  She reports that she has not been to see any doctors in a while and is out of all her bp medications.  She also canceled her CT scan on 05/22/16 due to not having access to transportation.  BP (!) 192/118 (BP Location: Right Arm, Patient Position: Sitting, Cuff Size: Small)   Pulse 96   Temp 97.5 F (36.4 C) (Oral)   Ht '5\' 6"'$  (1.676 m)   Wt 98 lb 3.2 oz (44.5 kg)   SpO2 98%   BMI 15.85 kg/m    Wt Readings from Last 3 Encounters:  06/13/16 98 lb 3.2 oz (44.5 kg)  11/16/15 103 lb 12.8 oz (47.1 kg)  03/23/15 101 lb 14.4 oz (46.2 kg)

## 2016-06-13 NOTE — Telephone Encounter (Signed)
Called patient to inform of Ct on 06-20-16- arrival time - 3:15 pm, pt. To be NPO 4 hrs. Prior to test @ [facility], spoke with patient's daughter, [name] and she is aware of this test

## 2016-06-20 ENCOUNTER — Ambulatory Visit (HOSPITAL_COMMUNITY)
Admission: RE | Admit: 2016-06-20 | Discharge: 2016-06-20 | Disposition: A | Discharge status: Home / Self Care | Payer: Medicare Other | Source: Ambulatory Visit | Attending: Radiation Oncology | Admitting: Radiation Oncology

## 2016-06-20 DIAGNOSIS — R918 Other nonspecific abnormal finding of lung field: Secondary | ICD-10-CM | POA: Insufficient documentation

## 2016-06-20 DIAGNOSIS — R911 Solitary pulmonary nodule: Secondary | ICD-10-CM

## 2016-06-20 DIAGNOSIS — J439 Emphysema, unspecified: Secondary | ICD-10-CM | POA: Insufficient documentation

## 2016-06-20 DIAGNOSIS — I700 Atherosclerosis of aorta: Secondary | ICD-10-CM | POA: Insufficient documentation

## 2016-09-05 ENCOUNTER — Telehealth: Payer: Self-pay | Admitting: Oncology

## 2016-09-05 NOTE — Telephone Encounter (Signed)
Called Aedyn's home number and there was no answer and no voicemail set up.  Left a message for her daughter, [name], to call us back regarding scheduling a PET scan for Maame.

## 2016-09-09 ENCOUNTER — Other Ambulatory Visit: Payer: Self-pay | Admitting: Radiation Oncology

## 2016-09-09 DIAGNOSIS — R911 Solitary pulmonary nodule: Secondary | ICD-10-CM

## 2016-09-10 ENCOUNTER — Telehealth: Payer: Self-pay | Admitting: *Deleted

## 2016-09-10 NOTE — Telephone Encounter (Signed)
CALLED PATIENT TO INFORM OF PET SCAN FOR 09-24-16 - ARRIVAL TIME - 11:30 AM, PATIENT TO BE NPO - 6 HRS. PRIOR TO TEST, TEST TO BE @ [facility], SPOKE WITH PATIENT AND SHE IS AWARE OF THIS TEST.

## 2016-09-24 ENCOUNTER — Ambulatory Visit (HOSPITAL_COMMUNITY): Payer: Medicare Other

## 2016-10-04 ENCOUNTER — Ambulatory Visit (HOSPITAL_COMMUNITY): Payer: Medicare Other

## 2016-10-24 ENCOUNTER — Ambulatory Visit (INDEPENDENT_AMBULATORY_CARE_PROVIDER_SITE_OTHER): Payer: Medicare Other | Admitting: Family Medicine

## 2016-10-24 VITALS — BP 160/100 | HR 89 | Temp 98.3°F | Resp 16 | Ht 65.5 in | Wt 110.0 lb

## 2016-10-24 DIAGNOSIS — M7989 Other specified soft tissue disorders: Secondary | ICD-10-CM | POA: Diagnosis not present

## 2016-10-24 DIAGNOSIS — I10 Essential (primary) hypertension: Secondary | ICD-10-CM

## 2016-10-24 MED ORDER — RIVAROXABAN (XARELTO) VTE STARTER PACK (15 & 20 MG): ORAL_TABLET | ORAL | 0 refills | Status: DC

## 2016-10-24 NOTE — Progress Notes (Signed)
Brenda Gardner is a 83 y.o. female who presents to [place] [facility] today for Right leg swelling:  1.  Right leg swelling: Present for the past 3-4 days. She has chronic bilateral lower extremity edema of her ankles. However soreness if she noted increasing swelling of her right calf. She denies any pain or tenderness here. No difficulty walking. She has had no history of falls. The leg feels somewhat warm. It is more red on that side. She has history of unknown tongue lesion removed 2016. She also has history of radiation for a lung nodule also in 2016.  No trauma to legs, no injury.  She's had no recent long distance travel. No surgeries for the past year. She has never had a blood clot previously.  She takes an aspirin 325 mg but only if she has a headache. Once a week at the most.  Past medical history: Significant for osteoarthritis, cataracts, nodules as above, hypertension  she has had an appendectomy. She had a cataract removal. She had fracture repair of her right arm about 30 years ago.  Family history: No history of blood clots. Otherwise noncontributory.  Social: Current every day smoker. Denies any illicit drug use.  She lives with her daughter.  No falls at home in past year.   ROS as above.  No chest pain. No dyspnea. No palpitations. No nausea or vomiting. She is eating and drinking well.  PMH reviewed. Patient is a nonsmoker.   Past Medical History:  Diagnosis Date  . Anxiety    SINCE THIS IS COMING UP  . Arthritis   . Cancer ([place])   . Cataract   . Complication of anesthesia   . Hemochromatosis   . Hypertension   . Lesion of left lung    ONLY DOING RADIATION  . PONV (postoperative nausea and vomiting)    54 YRS AGO DURING CHILDBIRTH  . Radiation 03/15/14-03/28/14   left apical nodule 50 gray   Past Surgical History:  Procedure Laterality Date  . APPENDECTOMY    . EYE SURGERY     BIL CATARACTS  . GLOSSECTOMY Left 01/07/2014   Procedure: PARTIAL GLOSSECTOMY;   Surgeon: [name], MD;  Location: [place];  Service: ENT;  Laterality: Left;  . RADICAL NECK DISSECTION Left 01/07/2014   Procedure: LEFT  NECK DISSECTION WITH FROZEN SECTION;  Surgeon: [name], MD;  Location: [place];  Service: ENT;  Laterality: Left;  . TONSILLECTOMY    . TUBAL LIGATION    . VEIN LIGATION Bilateral 1980's    Medications reviewed. Current Outpatient Prescriptions  Medication Sig Dispense Refill  . amLODipine (NORVASC) 2.5 MG tablet Take 1 tablet (2.5 mg total) by mouth daily. Reported on 11/16/2015 30 tablet 0  . aspirin 325 MG EC tablet Take 325 mg by mouth every 6 (six) hours as needed for pain or fever. Reported on 11/16/2015    . metoprolol succinate (TOPROL-XL) 25 MG 24 hr tablet Take 1 tablet (25 mg total) by mouth daily. Reported on 11/16/2015 30 tablet 0   No current facility-administered medications for this visit.      Physical Exam:  BP (!) 180/104   Pulse 89   Temp 98.3 F (36.8 C) (Oral)   Resp 16   Ht 5' 5.5" (1.664 m)   Wt 110 lb (49.9 kg)   SpO2 95%   BMI 18.03 kg/m  Gen:  Alert, cooperative patient who appears stated age in no acute distress.  Vital signs  reviewed. HEENT: EOMI.  Arcus senilis present BL  MMM Pulm:  Clear to auscultation bilaterally with good air movement.  No wheezes or rales noted.   Cardiac:  Regular rate and rhythm without murmur auscultated.  Good S1/S2. Exts: +3 edema and hemosiderin changes noted bilateral lower extremities to mid shins. However on her right leg she has +4 edema that extends up to her knee. Does not extend beyond her knee. Her right ankle is more warm and red than her left. Her calf itself however does not have any warmth. She does have some mild tenderness to deep palpation of her right calf. Neuro: Alert and oriented 4. Ambulates slowly but well. Skin:  Scattered senile purpura arms and legs.  Varicose veins noted BL legs.   Assessment and Plan:  1.  LE edema: - Concern for Right leg DVT.   - plan  will be to send for U/S doppler to check for DVT. - If positive will start xarelto.   - she's had no falls at home in at least the past year, if not longer.  No history of GI bleeding.   - she is also on Norvasc, though she hasn't taken her BP meds for over a week.  This may have contributed to long-standing edema.  Longstanding nature obvious due to hemosiderin changes to skin  2.  HTN:   - elevated today.  No chest pain, headaches, dsypnea.  No other signs of end organ damage.   - as above, hasn't taken her meds for over a week.   - encouraged compliance.  To start taking this evening.

## 2016-10-24 NOTE — Patient Instructions (Addendum)
Take the Xarelto one pill tonight and again in the morning.    If your leg ultrasound is positive you will then take '15mg'$  tablet by mouth twice a day with food x 21 days.. On Day 22, switch to one '20mg'$  tablet once a day with food.  If it is negative, do not take anymore after tomorrow AM.  Do not take the aspirin if you are taking the Xarelto.    Your appt is :  10/25/16 1100am [facility] [address] Check in at admitting  IF you received an x-ray today, you will receive an invoice from [facility]. Please contact [facility] at [phone] with questions or concerns regarding your invoice.   IF you received labwork today, you will receive an invoice from [facility]. Please contact [facility] at [phone] with questions or concerns regarding your invoice.   Our billing staff will not be able to assist you with questions regarding bills from these companies.  You will be contacted with the lab results as soon as they are available. The fastest way to get your results is to activate your My Chart account. Instructions are located on the last page of this paperwork. If you have not heard from [place] regarding the results in 2 weeks, please contact this office.

## 2016-10-25 ENCOUNTER — Telehealth: Payer: Self-pay

## 2016-10-25 ENCOUNTER — Ambulatory Visit (HOSPITAL_COMMUNITY)
Admission: RE | Admit: 2016-10-25 | Discharge: 2016-10-25 | Disposition: A | Discharge status: Home / Self Care | Payer: Medicare Other | Source: Ambulatory Visit | Attending: Family Medicine | Admitting: Family Medicine

## 2016-10-25 DIAGNOSIS — M7989 Other specified soft tissue disorders: Secondary | ICD-10-CM | POA: Diagnosis not present

## 2016-10-25 DIAGNOSIS — L539 Erythematous condition, unspecified: Secondary | ICD-10-CM | POA: Insufficient documentation

## 2016-10-25 DIAGNOSIS — M79604 Pain in right leg: Secondary | ICD-10-CM | POA: Diagnosis present

## 2016-10-25 DIAGNOSIS — S8011XA Contusion of right lower leg, initial encounter: Secondary | ICD-10-CM | POA: Diagnosis not present

## 2016-10-25 NOTE — Telephone Encounter (Signed)
Called report on LE VS  Neg clot   Moderate interstitial fluid in LE  Patient sent home and told you would call if further info

## 2016-10-25 NOTE — Progress Notes (Signed)
VASCULAR LAB PRELIMINARY  PRELIMINARY  PRELIMINARY  PRELIMINARY  Right lower extremity venous duplex completed.    Preliminary report:  Right:  No evidence of DVT, superficial thrombosis, or Baker's cyst. Moderate interstitial fluid noted throughout the lower leg  Brenda Gardner, RVS 10/25/2016, 12:00 PM

## 2016-10-25 NOTE — Telephone Encounter (Signed)
Called and spoke with patient and reviewed ultrasound report.  Negative dopplers.  Patient is much relieved.  Still with BL ankle swelling and right calf swelling.  States she feels very well today.  No pain in leg.  No fevers/dyspnea.   Discussed she should return in next week if still with swelling in that leg.  We would need to think of other things that might be causing unilateral edema.   Patient states she does not keep her feet elevated and sits much of the day.  This likely contributes to her BL LE and possibly unilateral swelling.  Discussed she needs to keep feet elevated as much as possible and facilitate movement around the house.    With her history of recent radiation for presumed lung CA, would be concerned for some form of compression causing unilateral swelling.  Will work this up if doesn't resolve.

## 2017-02-19 ENCOUNTER — Other Ambulatory Visit: Payer: Self-pay | Admitting: Nurse Practitioner

## 2017-10-30 ENCOUNTER — Telehealth: Payer: Self-pay | Admitting: Family Medicine

## 2017-10-30 ENCOUNTER — Encounter: Payer: Self-pay | Admitting: Family Medicine

## 2017-10-30 ENCOUNTER — Ambulatory Visit (INDEPENDENT_AMBULATORY_CARE_PROVIDER_SITE_OTHER): Payer: Medicare Other | Admitting: Family Medicine

## 2017-10-30 ENCOUNTER — Other Ambulatory Visit: Payer: Self-pay

## 2017-10-30 ENCOUNTER — Ambulatory Visit (INDEPENDENT_AMBULATORY_CARE_PROVIDER_SITE_OTHER): Payer: Medicare Other

## 2017-10-30 VITALS — BP 132/84 | HR 91 | Temp 97.9°F | Ht 66.4 in | Wt 106.6 lb

## 2017-10-30 DIAGNOSIS — R05 Cough: Secondary | ICD-10-CM

## 2017-10-30 DIAGNOSIS — R9389 Abnormal findings on diagnostic imaging of other specified body structures: Secondary | ICD-10-CM

## 2017-10-30 DIAGNOSIS — R059 Cough, unspecified: Secondary | ICD-10-CM

## 2017-10-30 DIAGNOSIS — R911 Solitary pulmonary nodule: Secondary | ICD-10-CM

## 2017-10-30 DIAGNOSIS — R0602 Shortness of breath: Secondary | ICD-10-CM

## 2017-10-30 DIAGNOSIS — F17200 Nicotine dependence, unspecified, uncomplicated: Secondary | ICD-10-CM

## 2017-10-30 LAB — POCT CBC
Granulocyte percent: 77 %G (ref 37–80)
HCT, POC: 38.6 % (ref 37.7–47.9)
Hemoglobin: 12.6 g/dL (ref 12.2–16.2)
Lymph, poc: 2 (ref 0.6–3.4)
MCH, POC: 30.4 pg (ref 27–31.2)
MCHC: 32.6 g/dL (ref 31.8–35.4)
MCV: 93.3 fL (ref 80–97)
MID (cbc): 0.9 (ref 0–0.9)
MPV: 6.5 fL (ref 0–99.8)
POC Granulocyte: 9.5 — AB (ref 2–6.9)
POC LYMPH PERCENT: 16 %L (ref 10–50)
POC MID %: 7 %M (ref 0–12)
Platelet Count, POC: 337 10*3/uL (ref 142–424)
RBC: 4.14 M/uL (ref 4.04–5.48)
RDW, POC: 13.2 %
WBC: 12.3 10*3/uL — AB (ref 4.6–10.2)

## 2017-10-30 MED ORDER — AZITHROMYCIN 250 MG PO TABS: ORAL_TABLET | ORAL | 0 refills | Status: DC

## 2017-10-30 NOTE — Telephone Encounter (Signed)
Incoming call from [place] at [facility]. She states patient has a mass like opacity in right lung concerning for neoplasm. If Dr. Pamella Pert would like to know radiologist's recommendation for further eval, he advises a CT chest with Contrast.

## 2017-10-30 NOTE — Addendum Note (Signed)
Addended by: Rutherford Guys on: 10/30/2017 05:07 PM   Modules accepted: Orders

## 2017-10-30 NOTE — Patient Instructions (Signed)
     IF you received an x-ray today, you will receive an invoice from [facility]. Please contact [facility] at [phone] with questions or concerns regarding your invoice.   IF you received labwork today, you will receive an invoice from [facility]. Please contact [facility] at [phone] with questions or concerns regarding your invoice.   Our billing staff will not be able to assist you with questions regarding bills from these companies.  You will be contacted with the lab results as soon as they are available. The fastest way to get your results is to activate your My Chart account. Instructions are located on the last page of this paperwork. If you have not heard from us regarding the results in 2 weeks, please contact this office.     

## 2017-10-30 NOTE — Telephone Encounter (Signed)
Xray results discussed with patient in real time and ct chest ordered.

## 2017-10-30 NOTE — Progress Notes (Addendum)
2/28/20192:43 PM  Brenda Gardner Apr 27, 1933, 84 y.o. female [id number]  Chief Complaint  Patient presents with  . Cough    for the past few weeks and has gotten progressively worse    HPI:   Patient is a 84 y.o. female with past medical history significant for PET + lung nodule and tongue soft tissue mass s/p resection and radiation, tobacco use and HTN who presents today for worsening cough and SOB x 2 months  No PCP PET positive pulmonary nodule left apical lung, partial glossotomy, neck LN resection (left side) in 2015, treated with radiation Last seen by rad-onc 06/2016 Smokes, denies h/o COPD, CXR in 2015 shows COPD changes 2 months or progression non productive cough and SOB Gets winded when walking in her house Has not taken anything for this Denies any sputum, did not start with a URI Lives with her daughter   Depression screen [facility] 2/9 10/30/2017 10/24/2016 02/17/2014  Decreased Interest 0 0 0  Down, Depressed, Hopeless 0 0 0  PHQ - 2 Score 0 0 0    Allergies  Allergen Reactions  . Lisinopril Swelling    Swelling of tongue    Prior to Admission medications   Medication Sig Start Date End Date Taking? Authorizing Provider  amLODipine (NORVASC) 2.5 MG tablet Take 1 tablet (2.5 mg total) by mouth daily. Reported on 11/16/2015 Patient not taking: Reported on 10/30/2017 06/13/16   Gery Pray, MD  aspirin 325 MG EC tablet Take 325 mg by mouth every 6 (six) hours as needed for pain or fever. Reported on 11/16/2015    [provider]  metoprolol succinate (TOPROL-XL) 25 MG 24 hr tablet Take 1 tablet (25 mg total) by mouth daily. Reported on 11/16/2015 Patient not taking: Reported on 10/30/2017 06/13/16   Gery Pray, MD    Past Medical History:  Diagnosis Date  . Anxiety    SINCE THIS IS COMING UP  . Arthritis   . Cancer ([place])   . Cataract   . Complication of anesthesia   . Hemochromatosis   . Hypertension   . Lesion of left lung    ONLY DOING  RADIATION  . PONV (postoperative nausea and vomiting)    54 YRS AGO DURING CHILDBIRTH  . Radiation 03/15/14-03/28/14   left apical nodule 50 gray    Past Surgical History:  Procedure Laterality Date  . APPENDECTOMY    . EYE SURGERY     BIL CATARACTS  . GLOSSECTOMY Left 01/07/2014   Procedure: PARTIAL GLOSSECTOMY;  Surgeon: [name], MD;  Location: [place];  Service: ENT;  Laterality: Left;  . RADICAL NECK DISSECTION Left 01/07/2014   Procedure: LEFT  NECK DISSECTION WITH FROZEN SECTION;  Surgeon: [name], MD;  Location: [place];  Service: ENT;  Laterality: Left;  . TONSILLECTOMY    . TUBAL LIGATION    . VEIN LIGATION Bilateral 1980's    Social History   Tobacco Use  . Smoking status: Current Every Day Smoker    Packs/day: 0.50    Years: 25.00    Pack years: 12.50    Types: Cigarettes  . Smokeless tobacco: Never Used  Substance Use Topics  . Alcohol use: Yes    Alcohol/week: 3.5 oz    Types: 7 Standard drinks or equivalent per week    Family History  Problem Relation Age of Onset  . Mesothelioma Father     Review of Systems  Constitutional: Negative for chills, diaphoresis, fever, malaise/fatigue and weight loss.  HENT: Negative for congestion, ear pain and sore throat.   Respiratory: Positive for cough and shortness of breath. Negative for hemoptysis, sputum production and wheezing.   Cardiovascular: Negative for chest pain, palpitations and leg swelling.  Gastrointestinal: Negative for abdominal pain, nausea and vomiting.     OBJECTIVE:  Blood pressure 132/84, pulse 91, temperature 97.9 F (36.6 C), temperature source Oral, height 5' 6.4" (1.687 m), weight 106 lb 9.6 oz (48.4 kg), SpO2 96 %.  Physical Exam  Constitutional: She is oriented to person, place, and time and well-developed, well-nourished, and in no distress.  Frail and thin  HENT:  Head: Normocephalic and atraumatic.  Mouth/Throat: Oropharynx is clear and moist. No oropharyngeal exudate.  Eyes:  EOM are normal. Pupils are equal, round, and reactive to light. No scleral icterus.  Neck: Neck supple.  Cardiovascular: Normal rate, regular rhythm and normal heart sounds. Exam reveals no gallop and no friction rub.  No murmur heard. Pulmonary/Chest: Effort normal and breath sounds normal. She has no wheezes. She has no rales.  Musculoskeletal: She exhibits no edema.  Neurological: She is alert and oriented to person, place, and time. Gait normal.  Skin: Skin is warm and dry.    Results for orders placed or performed in visit on 10/30/17 (from the past 24 hour(s))  POCT CBC     Status: Abnormal   Collection Time: 10/30/17  4:05 PM  Result Value Ref Range   WBC 12.3 (A) 4.6 - 10.2 K/uL   Lymph, poc 2.0 0.6 - 3.4   POC LYMPH PERCENT 16.0 10 - 50 %L   MID (cbc) 0.9 0 - 0.9   POC MID % 7.0 0 - 12 %M   POC Granulocyte 9.5 (A) 2 - 6.9   Granulocyte percent 77.0 37 - 80 %G   RBC 4.14 4.04 - 5.48 M/uL   Hemoglobin 12.6 12.2 - 16.2 g/dL   HCT, POC 38.6 37.7 - 47.9 %   MCV 93.3 80 - 97 fL   MCH, POC 30.4 27 - 31.2 pg   MCHC 32.6 31.8 - 35.4 g/dL   RDW, POC 13.2 %   Platelet Count, POC 337 142 - 424 K/uL   MPV 6.5 0 - 99.8 fL    Dg Chest 2 View  Result Date: 10/30/2017 CLINICAL DATA:  Two months of worsening cough and shortness of breath, history of prior LEFT apical lung nodule is radiation therapy, history smoking, cancer of the tongue EXAM: CHEST  2 VIEW COMPARISON:  CT chest 06/20/2016 FINDINGS: Normal heart size, mediastinal contours, and pulmonary vascularity. Atherosclerotic calcification aorta. RIGHT hilar enlargement. Masslike opacity at RIGHT apex highly concerning for neoplasm, 6.7 x 7.1 x 6.0 cm. Emphysematous changes consistent with COPD. Minimal atelectasis or scarring in the RIGHT mid lung. No pulmonary infiltrate, pleural effusion or pneumothorax. Diffuse osseous demineralization. IMPRESSION: COPD changes with masslike opacity at the RIGHT apex 6.7 x 7.1 x 6.0 cm in size  highly concerning for neoplasm. Suspected RIGHT hilar adenopathy. CT chest with contrast recommended for further evaluation. These results will be called to the ordering clinician or representative by the Radiologist Assistant, and communication documented in the PACS or zVision Dashboard. Electronically Signed   By: Lavonia Dana M.D.   On: 10/30/2017 16:20     ASSESSMENT and PLAN  1. Cough - POCT CBC - DG Chest 2 View; Future - Comprehensive metabolic panel - CT Chest With Contrast; Future  2. SOB (shortness of breath) - POCT CBC - DG Chest  2 View; Future - Comprehensive metabolic panel - CT Chest With Contrast; Future  3. Tobacco use disorder - DG Chest 2 View; Future - CT Chest With Contrast; Future  4. Lung nodule - DG Chest 2 View; Future  5. Abnormal  Discussed abnormal CXR, treating empirically for CAP, ordering CT scan. RTC precautions reviewed. - CT Chest With Contrast; Future  Other orders - azithromycin (ZITHROMAX) 250 MG tablet; Take 2 tabs PO on first day, then 1 tab PO x 4 days  Return in about 2 weeks (around 11/13/2017).    Rutherford Guys, MD Primary Care at [facility], [place] [address] Ph.  [phone] Fax [phone]

## 2017-10-31 ENCOUNTER — Telehealth: Payer: Self-pay | Admitting: Family Medicine

## 2017-10-31 LAB — COMPREHENSIVE METABOLIC PANEL
ALT: 8 IU/L (ref 0–32)
AST: 19 IU/L (ref 0–40)
Albumin/Globulin Ratio: 1.4 (ref 1.2–2.2)
Albumin: 3.6 g/dL (ref 3.5–4.7)
Alkaline Phosphatase: 105 IU/L (ref 39–117)
BUN/Creatinine Ratio: 15 (ref 12–28)
BUN: 11 mg/dL (ref 8–27)
Bilirubin Total: 0.4 mg/dL (ref 0.0–1.2)
CO2: 23 mmol/L (ref 20–29)
Calcium: 9.1 mg/dL (ref 8.7–10.3)
Chloride: 97 mmol/L (ref 96–106)
Creatinine, Ser: 0.75 mg/dL (ref 0.57–1.00)
GFR calc Af Amer: 86 mL/min/{1.73_m2} (ref >59)
GFR calc non Af Amer: 74 mL/min/{1.73_m2} (ref >59)
Globulin, Total: 2.6 g/dL (ref 1.5–4.5)
Glucose: 96 mg/dL (ref 65–99)
Potassium: 4.9 mmol/L (ref 3.5–5.2)
Sodium: 136 mmol/L (ref 134–144)
Total Protein: 6.2 g/dL (ref 6.0–8.5)

## 2017-10-31 NOTE — Telephone Encounter (Signed)
Pt is scheduled to have CT scan on 11/04/17 at 2:30pm with an arrival time of 2:15pm at [facility]. She is to only have water after 10:30am so if she would like to eat, they advised pt to eat before 10:30am. Pt was unable to go to appt today, and Brenda Gardner next appt was Tuesday. Brenda Gardner also asked if pt had had recent creatinine labs done. They said if not, she will need to have BUN creatinine either done here prior to her exam, or if the doctor wants to place orders for it, they can do it there on Tuesday before her scan. Please advise. I told pt we will let her know whether we need her to come in for labs or if she will just do them Tuesday at [facility]. Pt advised of all other instructions above. Thanks!

## 2017-10-31 NOTE — Telephone Encounter (Signed)
I actually ordered a CMP knowing that it would be needed for the ct. Creatinine was normal. Please let patient know that no further labs are needed. Thanks!

## 2017-11-03 NOTE — Telephone Encounter (Signed)
Advised pt no further labs needed before CT. Thanks!

## 2017-11-04 ENCOUNTER — Ambulatory Visit (HOSPITAL_COMMUNITY)
Admission: RE | Admit: 2017-11-04 | Discharge: 2017-11-04 | Disposition: A | Discharge status: Home / Self Care | Payer: Medicare Other | Source: Ambulatory Visit | Attending: Family Medicine | Admitting: Family Medicine

## 2017-11-04 ENCOUNTER — Other Ambulatory Visit: Payer: Self-pay | Admitting: Family Medicine

## 2017-11-04 DIAGNOSIS — J90 Pleural effusion, not elsewhere classified: Secondary | ICD-10-CM | POA: Diagnosis not present

## 2017-11-04 DIAGNOSIS — I2510 Atherosclerotic heart disease of native coronary artery without angina pectoris: Secondary | ICD-10-CM | POA: Diagnosis not present

## 2017-11-04 DIAGNOSIS — R05 Cough: Secondary | ICD-10-CM

## 2017-11-04 DIAGNOSIS — F17200 Nicotine dependence, unspecified, uncomplicated: Secondary | ICD-10-CM

## 2017-11-04 DIAGNOSIS — I700 Atherosclerosis of aorta: Secondary | ICD-10-CM | POA: Insufficient documentation

## 2017-11-04 DIAGNOSIS — R059 Cough, unspecified: Secondary | ICD-10-CM

## 2017-11-04 DIAGNOSIS — R0602 Shortness of breath: Secondary | ICD-10-CM

## 2017-11-04 DIAGNOSIS — R918 Other nonspecific abnormal finding of lung field: Secondary | ICD-10-CM

## 2017-11-04 DIAGNOSIS — J439 Emphysema, unspecified: Secondary | ICD-10-CM | POA: Insufficient documentation

## 2017-11-04 DIAGNOSIS — J189 Pneumonia, unspecified organism: Secondary | ICD-10-CM | POA: Insufficient documentation

## 2017-11-04 DIAGNOSIS — K7460 Unspecified cirrhosis of liver: Secondary | ICD-10-CM | POA: Diagnosis not present

## 2017-11-04 DIAGNOSIS — R9389 Abnormal findings on diagnostic imaging of other specified body structures: Secondary | ICD-10-CM

## 2017-11-04 DIAGNOSIS — C760 Malignant neoplasm of head, face and neck: Secondary | ICD-10-CM | POA: Diagnosis not present

## 2017-11-04 MED ORDER — SODIUM CHLORIDE 0.9 % IJ SOLN
INTRAMUSCULAR | Status: AC
  Filled 2017-11-04: qty 50

## 2017-11-04 MED ORDER — IOPAMIDOL (ISOVUE-300) INJECTION 61%
INTRAVENOUS | Status: AC
  Filled 2017-11-04: qty 75

## 2017-11-04 MED ORDER — IOPAMIDOL (ISOVUE-300) INJECTION 61%: 75.0000 mL | Freq: Once | INTRAVENOUS | Status: DC | PRN

## 2017-11-13 ENCOUNTER — Ambulatory Visit: Payer: Medicare Other | Admitting: Family Medicine

## 2017-11-14 ENCOUNTER — Other Ambulatory Visit: Payer: Self-pay

## 2017-11-14 ENCOUNTER — Institutional Professional Consult (permissible substitution): Payer: Medicare Other | Admitting: Thoracic Surgery (Cardiothoracic Vascular Surgery)

## 2017-11-14 ENCOUNTER — Encounter: Payer: Self-pay | Admitting: Thoracic Surgery (Cardiothoracic Vascular Surgery)

## 2017-11-14 VITALS — BP 152/81 | HR 87 | Resp 20 | Ht 66.4 in | Wt 110.0 lb

## 2017-11-14 DIAGNOSIS — R590 Localized enlarged lymph nodes: Secondary | ICD-10-CM

## 2017-11-14 DIAGNOSIS — R918 Other nonspecific abnormal finding of lung field: Secondary | ICD-10-CM

## 2017-11-14 NOTE — Progress Notes (Signed)
[address]       [place] [address]             [phone]     HPI: Mrs. Saxby is sent for consultation regarding a right lung mass  Mrs. Ortner is an 84 year old woman with a past history of tobacco abuse, cancer of the tongue, stage IA lung cancer left upper lobe treated with stereotactic radiation, hypertension, hemochromatosis, and arthritis.  She recently presented with a cough.  This been going for about 2 months now.  It has been nonproductive.  She has also been feeling some shortness of breath with exertion.  She saw Dr. Pamella Pert.  A chest x-ray showed a right apical mass.  CT of the chest showed a 7 cm mass at the right apex with bulky hilar and mediastinal adenopathy.  There also was a small right pleural effusion.  She continues to smoke.  She does get short of breath with exertion but does not have any orthopnea.  Zubrod Score: At the time of surgery this patient's most appropriate activity status/level should be described as: []     0    Normal activity, no symptoms [x]     1    Restricted in physical strenuous activity but ambulatory, able to do out light work []     2    Ambulatory and capable of self care, unable to do work activities, up and about >50 % of waking hours                              []     3    Only limited self care, in bed greater than 50% of waking hours []     4    Completely disabled, no self care, confined to bed or chair []     5    Moribund   Past Medical History:  Diagnosis Date  . Anxiety    SINCE THIS IS COMING UP  . Arthritis   . Cancer ([place])   . Cataract   . Complication of anesthesia   . Hemochromatosis   . Hypertension   . Lesion of left lung    ONLY DOING RADIATION  . PONV (postoperative nausea and vomiting)    54 YRS AGO DURING CHILDBIRTH  . Radiation 03/15/14-03/28/14   left apical nodule 50 gray    Current Outpatient Medications  Medication Sig Dispense Refill  . acetaminophen (TYLENOL) 325 MG tablet Take  650 mg by mouth every 6 (six) hours as needed.    Marland Kitchen ibuprofen (ADVIL,MOTRIN) 100 MG chewable tablet Chew by mouth every 8 (eight) hours as needed.     No current facility-administered medications for this visit.     Physical Exam BP (!) 152/81 (BP Location: Left Arm, Patient Position: Sitting, Cuff Size: Normal)   Pulse 87   Resp 20   Ht 5' 6.4" (1.687 m)   Wt 110 lb (49.9 kg)   SpO2 (!) 89% Comment: RA, family states this is normal for her when she exerts  BMI 17.48 kg/m  84 year old woman in no acute distress Alert and oriented x3, mild resting tremor Thenar wasting No cervical or subclavicular adenopathy Cardiac regular rate and rhythm Lungs diminished breath sounds at right base, no rales or wheezing Abdomen soft nontender Extremities with clubbing  Diagnostic Tests: CT CHEST WITHOUT CONTRAST  TECHNIQUE: Multidetector CT imaging of the chest was performed following the standard protocol without  IV contrast.  COMPARISON:  Chest radiograph 10/30/2017 and CT chest 06/20/2016.  FINDINGS: Cardiovascular: Atherosclerotic calcification of the arterial vasculature, including coronary arteries and aortic valve. Heart is at the upper limits of normal in size. No pericardial effusion.  Mediastinum/Nodes: Mediastinal adenopathy measures up to 3.2 cm in short axis in the low right paratracheal station. Right hilar adenopathy measures approximately 2.5 cm in short axis. No axillary adenopathy. Esophagus is grossly unremarkable.  Lungs/Pleura: Right upper lobe mass measures 5.4 x 6.8 cm and has a long border of pleural contact. Associated peribronchial thickening extending to the right hilum with patchy ground-glass in the posterior segment right upper lobe. Moderate centrilobular emphysema. Small to moderate right pleural effusion. Subpleural thickening and focal bullous disease in the medial aspect of the left upper lobe, unchanged. Probable subpleural atelectasis in  the left lower lobe (image 121). Extrinsic compression on the right mainstem bronchus, bronchus intermedius and right upper and right middle lobe bronchi. Airway is otherwise unremarkable.  Upper Abdomen: Liver margin is irregular. Visualized portions of the liver, adrenal glands and right kidney are unremarkable. Left renal stone versus renal vascular calcification. Visualized portions of the spleen, pancreas, stomach and bowel are grossly unremarkable. No upper abdominal adenopathy.  Musculoskeletal: Degenerative changes in the spine. Large area of partially imaged lucency in the right humeral head is unchanged. No worrisome lytic or sclerotic lesions. Mild compression of the inferior endplate of L2 is unchanged.  IMPRESSION: 1. Right upper lobe mass with postobstructive pneumonitis in the right upper lobe and bulky right hilar/ipsilateral mediastinal adenopathy, most consistent with primary bronchogenic carcinoma and at least stage IIIA disease. If the small to moderate right pleural effusion is malignant, then findings are consistent with stage IV disease. 2. Aortic atherosclerosis (ICD10-170.0). Coronary artery calcification. 3.  Emphysema (ICD10-J43.9). 4. Cirrhosis. 5. Left renal stone versus vascular calcification.   Electronically Signed   By: Lorin Picket M.D.   On: 11/05/2017 08:05 I personally reviewed the CT images and concur with the findings noted above  Impression: Mrs. Brower is an 84 year old woman with a history of tobacco abuse.  She has history of previous tobacco related cancer with a cancer of the tongue and a stage Ia non-small cell carcinoma of the left upper lobe.  She had stereotactic radiation for the left upper lobe nodule with Dr. Sondra Come.  She now presents with a cough and workup has revealed a large right apical mass with bulky hilar and mediastinal adenopathy and a pleural effusion.  This almost certainly is a new primary bronchogenic  carcinoma. Clinical stage IV (T3, N2, M1).  Surgery is not an option for treatment.  I reviewed the CT images with Mrs. Blakley and her children.  I explained to them that she needs to complete a metastatic workup with a PET/CT and an MRI of the brain.  She will need a tissue diagnosis.  I think the easiest way to get that would be a CT-guided biopsy of the right apical mass.  Thoracentesis is also an option if the PET shows findings suspicious for a malignant effusion.  She is concerned about having to have a biopsy done.  I did encourage her to think about whether she would be willing to undergo treatment before doing the biopsies.  She should talk these issues over with her family.  We will go ahead and schedule the test in the meantime.  I am going to refer her to our multidisciplinary thoracic oncology clinic to see oncology and radiation  oncology.  Plan: PET/CT to guide an initial diagnostic workup MR brain to rule out metastases CT-guided biopsy after PET Referral to [place] to see Dr. Julien Nordmann and Dr. Darrick Penna, MD [facility] [phone]

## 2017-11-17 ENCOUNTER — Other Ambulatory Visit: Payer: Self-pay | Admitting: Thoracic Surgery (Cardiothoracic Vascular Surgery)

## 2017-11-17 DIAGNOSIS — R911 Solitary pulmonary nodule: Secondary | ICD-10-CM

## 2017-11-17 DIAGNOSIS — R918 Other nonspecific abnormal finding of lung field: Secondary | ICD-10-CM

## 2017-11-17 DIAGNOSIS — R51 Headache: Principal | ICD-10-CM

## 2017-11-17 DIAGNOSIS — R519 Headache, unspecified: Secondary | ICD-10-CM

## 2017-11-17 NOTE — Progress Notes (Unsigned)
ct 

## 2017-11-17 NOTE — Progress Notes (Unsigned)
Mri brain  

## 2017-11-18 ENCOUNTER — Encounter: Payer: Self-pay | Admitting: *Deleted

## 2017-11-18 ENCOUNTER — Telehealth: Payer: Self-pay

## 2017-11-18 ENCOUNTER — Telehealth: Payer: Self-pay | Admitting: *Deleted

## 2017-11-18 DIAGNOSIS — R911 Solitary pulmonary nodule: Secondary | ICD-10-CM

## 2017-11-18 NOTE — Telephone Encounter (Signed)
Oncology Nurse Navigator Documentation  Oncology Nurse Navigator Flowsheets 11/18/2017  Navigator Location [facility]  Referral date to RadOnc/MedOnc 11/18/2017  Navigator Encounter Type Telephone/I received referral on Brenda Gardner. I called and updated on appt time and place.   Telephone Outgoing Call  Treatment Phase Abnormal Scans  Barriers/Navigation Needs Coordination of Care;Education  Education Other  Interventions Coordination of Care;Education  Coordination of Care Appts  Education Method Verbal  Acuity Level 2  Acuity Level 2 Assistance expediting appointments;Educational needs  Time Spent with Patient 30

## 2017-11-18 NOTE — Telephone Encounter (Signed)
[name], Ms. Skiff's daughter called concerned about Ms. Bohnet's breathing that has worsened over the last 3-4 days.  She stated that it is worse in the mornings and tends to get better once she is up and about.  She felt she should be prescribed a breathing treatment.  I advised her to call Ms. Lacount's PCP and make them aware of patient's symptoms to decide which course of action should be taken.  She acknowledged receipt.

## 2017-11-19 ENCOUNTER — Encounter: Payer: Self-pay | Admitting: Radiation Oncology

## 2017-11-20 ENCOUNTER — Other Ambulatory Visit: Payer: Self-pay

## 2017-11-20 ENCOUNTER — Encounter: Payer: Self-pay | Admitting: Family Medicine

## 2017-11-20 ENCOUNTER — Ambulatory Visit: Payer: Medicare Other | Admitting: Family Medicine

## 2017-11-20 VITALS — BP 126/72 | HR 100 | Temp 97.9°F | Ht 66.0 in | Wt 107.8 lb

## 2017-11-20 DIAGNOSIS — Z720 Tobacco use: Secondary | ICD-10-CM | POA: Diagnosis not present

## 2017-11-20 DIAGNOSIS — R0602 Shortness of breath: Secondary | ICD-10-CM

## 2017-11-20 DIAGNOSIS — R918 Other nonspecific abnormal finding of lung field: Secondary | ICD-10-CM

## 2017-11-20 NOTE — Patient Instructions (Addendum)
1. Please monitor oxygenation at home. Please consider smoking cessation as I believe that supplemental oxygen would be beneficial if needed.     IF you received an x-ray today, you will receive an invoice from [facility]. Please contact [facility] at [phone] with questions or concerns regarding your invoice.   IF you received labwork today, you will receive an invoice from [facility]. Please contact [facility] at [phone] with questions or concerns regarding your invoice.   Our billing staff will not be able to assist you with questions regarding bills from these companies.  You will be contacted with the lab results as soon as they are available. The fastest way to get your results is to activate your My Chart account. Instructions are located on the last page of this paperwork. If you have not heard from [place] regarding the results in 2 weeks, please contact this office.

## 2017-11-20 NOTE — Progress Notes (Signed)
3/21/20193:05 PM  Brenda Gardner Apr 28, 1933, 84 y.o. female [id number]  Chief Complaint  Patient presents with  . Shortness of Breath    having trouble breathing. Says she can usually smoke up to 15 cigarettes a day. Now she can only smoke four. [place] is asking for referral for Pulmonary doctor    HPI:   Patient is a 84 y.o. female with past medical history significant for recent diagnosis of lung mass highly suspicious for cancer with concern for metastasis who presents today with her daughter.   Patient recently treated for pna on 10/30/17. Reports felling much better after completing treatment. They have meet with ct surg and have upcoming appts for staging radiographs, rad onc and med onc.  Daughter concerned as patient is tired, feeling SOB when walking short distances in her house, it seems to be worse in the mornings. Patient continues to smoke but even that has become harder for her.  No fever or chills. No productive cough. No chest pain or palpitations. No nausea , vomiting or diarrhea, No dysuria. No edema.     Depression screen [facility] 2/9 11/20/2017 10/30/2017 10/24/2016  Decreased Interest 0 0 0  Down, Depressed, Hopeless 0 0 0  PHQ - 2 Score 0 0 0    Allergies  Allergen Reactions  . Lisinopril Swelling    Swelling of tongue    Prior to Admission medications   Medication Sig Start Date End Date Taking? Authorizing Provider  acetaminophen (TYLENOL) 325 MG tablet Take 650 mg by mouth every 6 (six) hours as needed.   Yes [provider]  ibuprofen (ADVIL,MOTRIN) 100 MG chewable tablet Chew by mouth every 8 (eight) hours as needed.   Yes [provider]    Past Medical History:  Diagnosis Date  . Anxiety    SINCE THIS IS COMING UP  . Arthritis   . Cancer ([place])   . Cataract   . Complication of anesthesia   . Hemochromatosis   . Hypertension   . Lesion of left lung    ONLY DOING RADIATION  . PONV (postoperative nausea and vomiting)    56 YRS  [place] DURING CHILDBIRTH  . Radiation 03/15/14-03/28/14   left apical nodule 50 gray    Past Surgical History:  Procedure Laterality Date  . APPENDECTOMY    . EYE SURGERY     BIL CATARACTS  . GLOSSECTOMY Left 01/07/2014   Procedure: PARTIAL GLOSSECTOMY;  Surgeon: [name], MD;  Location: [facility];  Service: ENT;  Laterality: Left;  . RADICAL NECK DISSECTION Left 01/07/2014   Procedure: LEFT  NECK DISSECTION WITH FROZEN SECTION;  Surgeon: [name], MD;  Location: [facility];  Service: ENT;  Laterality: Left;  . TONSILLECTOMY    . TUBAL LIGATION    . VEIN LIGATION Bilateral 1980's    Social History   Tobacco Use  . Smoking status: Current Every Day Smoker    Packs/day: 0.50    Years: 25.00    Pack years: 12.50    Types: Cigarettes  . Smokeless tobacco: Never Used  Substance Use Topics  . Alcohol use: Yes    Alcohol/week: 3.5 oz    Types: 7 Standard drinks or equivalent per week    Family History  Problem Relation Age of Onset  . Mesothelioma Father     ROS Per hpi  OBJECTIVE:  Blood pressure 126/72, pulse 100, temperature 97.9 F (36.6 C), temperature source Oral, height 5\' 6"  (1.676 m), weight 107 lb 12.8 oz (  48.9 kg), SpO2 94 %.  Physical Exam  Constitutional: She is oriented to person, place, and time and well-developed, well-nourished, and in no distress.  Frail and thin  HENT:  Head: Normocephalic and atraumatic.  Mouth/Throat: Oropharynx is clear and moist. No oropharyngeal exudate.  Eyes: Pupils are equal, round, and reactive to light. EOM are normal. No scleral icterus.  Neck: Neck supple.  Cardiovascular: Normal rate, regular rhythm and normal heart sounds. Exam reveals no gallop and no friction rub.  No murmur heard. Pulmonary/Chest: Effort normal and breath sounds normal. No respiratory distress. She has no wheezes. She has no rales.  Musculoskeletal: She exhibits no edema.  Neurological: She is alert and oriented to person, place, and time.  Skin: Skin is  warm and dry.    SIX MIN WALK 11/20/2017  Baseline BP (sitting) 126/72  Baseline Heartrate 106  Baseline SPO2 94  Heartrate 123  SPO2 91     ASSESSMENT and PLAN  1. SOB (shortness of breath) on exertion VSS and exam unremarkable. Normal 6 minute walk. Discussed this might be related to lung mass. Patient is scheduled next week for significant amount of imaging and appts with specialist. There does not seem to be an indication for oxygen as of today. Patient not interested in quiting smoking. They would like referral to pulmonology. RTC precautions reviewed.  - 6 minute walk - Ambulatory referral to Pulmonology  2. Mass of upper lobe of right lung - Ambulatory referral to Pulmonology  3. Tobacco abuse - Ambulatory referral to Pulmonology  Return if symptoms worsen or fail to improve.    Rutherford Guys, MD [facility] at [place], [place] [address] Ph.  [phone] Fax [phone]

## 2017-11-24 ENCOUNTER — Encounter (HOSPITAL_COMMUNITY): Payer: Self-pay | Admitting: Emergency Medicine

## 2017-11-24 ENCOUNTER — Emergency Department (HOSPITAL_COMMUNITY): Payer: Medicare Other

## 2017-11-24 ENCOUNTER — Inpatient Hospital Stay (HOSPITAL_COMMUNITY)
Admission: EM | Admit: 2017-11-24 | Discharge: 2017-11-26 | DRG: 193 | Disposition: A | Discharge status: Home Health | Payer: Medicare Other | Attending: Internal Medicine | Admitting: Internal Medicine

## 2017-11-24 ENCOUNTER — Other Ambulatory Visit: Payer: Self-pay

## 2017-11-24 DIAGNOSIS — R590 Localized enlarged lymph nodes: Secondary | ICD-10-CM | POA: Diagnosis present

## 2017-11-24 DIAGNOSIS — Z888 Allergy status to other drugs, medicaments and biological substances status: Secondary | ICD-10-CM | POA: Diagnosis not present

## 2017-11-24 DIAGNOSIS — Z791 Long term (current) use of non-steroidal anti-inflammatories (NSAID): Secondary | ICD-10-CM

## 2017-11-24 DIAGNOSIS — I4891 Unspecified atrial fibrillation: Secondary | ICD-10-CM | POA: Diagnosis not present

## 2017-11-24 DIAGNOSIS — I878 Other specified disorders of veins: Secondary | ICD-10-CM | POA: Diagnosis present

## 2017-11-24 DIAGNOSIS — J188 Other pneumonia, unspecified organism: Secondary | ICD-10-CM | POA: Diagnosis not present

## 2017-11-24 DIAGNOSIS — J90 Pleural effusion, not elsewhere classified: Secondary | ICD-10-CM | POA: Diagnosis present

## 2017-11-24 DIAGNOSIS — R0902 Hypoxemia: Secondary | ICD-10-CM

## 2017-11-24 DIAGNOSIS — F17210 Nicotine dependence, cigarettes, uncomplicated: Secondary | ICD-10-CM | POA: Diagnosis not present

## 2017-11-24 DIAGNOSIS — I10 Essential (primary) hypertension: Secondary | ICD-10-CM | POA: Diagnosis not present

## 2017-11-24 DIAGNOSIS — I313 Pericardial effusion (noninflammatory): Secondary | ICD-10-CM | POA: Diagnosis not present

## 2017-11-24 DIAGNOSIS — Z9889 Other specified postprocedural states: Secondary | ICD-10-CM

## 2017-11-24 DIAGNOSIS — Z9842 Cataract extraction status, left eye: Secondary | ICD-10-CM

## 2017-11-24 DIAGNOSIS — J440 Chronic obstructive pulmonary disease with acute lower respiratory infection: Secondary | ICD-10-CM | POA: Diagnosis present

## 2017-11-24 DIAGNOSIS — E43 Unspecified severe protein-calorie malnutrition: Secondary | ICD-10-CM | POA: Diagnosis not present

## 2017-11-24 DIAGNOSIS — Z85810 Personal history of malignant neoplasm of tongue: Secondary | ICD-10-CM | POA: Diagnosis not present

## 2017-11-24 DIAGNOSIS — R918 Other nonspecific abnormal finding of lung field: Secondary | ICD-10-CM | POA: Diagnosis not present

## 2017-11-24 DIAGNOSIS — R05 Cough: Secondary | ICD-10-CM | POA: Diagnosis not present

## 2017-11-24 DIAGNOSIS — A419 Sepsis, unspecified organism: Secondary | ICD-10-CM

## 2017-11-24 DIAGNOSIS — Z923 Personal history of irradiation: Secondary | ICD-10-CM | POA: Diagnosis not present

## 2017-11-24 DIAGNOSIS — E871 Hypo-osmolality and hyponatremia: Secondary | ICD-10-CM | POA: Diagnosis not present

## 2017-11-24 DIAGNOSIS — L8990 Pressure ulcer of unspecified site, unspecified stage: Secondary | ICD-10-CM | POA: Diagnosis present

## 2017-11-24 DIAGNOSIS — C3411 Malignant neoplasm of upper lobe, right bronchus or lung: Secondary | ICD-10-CM | POA: Diagnosis not present

## 2017-11-24 DIAGNOSIS — Z6820 Body mass index (BMI) 20.0-20.9, adult: Secondary | ICD-10-CM

## 2017-11-24 DIAGNOSIS — I700 Atherosclerosis of aorta: Secondary | ICD-10-CM | POA: Diagnosis not present

## 2017-11-24 DIAGNOSIS — Z9841 Cataract extraction status, right eye: Secondary | ICD-10-CM | POA: Diagnosis not present

## 2017-11-24 DIAGNOSIS — Z85110 Personal history of malignant carcinoid tumor of bronchus and lung: Secondary | ICD-10-CM | POA: Diagnosis not present

## 2017-11-24 DIAGNOSIS — J181 Lobar pneumonia, unspecified organism: Principal | ICD-10-CM | POA: Diagnosis present

## 2017-11-24 DIAGNOSIS — L89529 Pressure ulcer of left ankle, unspecified stage: Secondary | ICD-10-CM | POA: Diagnosis not present

## 2017-11-24 DIAGNOSIS — I444 Left anterior fascicular block: Secondary | ICD-10-CM | POA: Diagnosis not present

## 2017-11-24 DIAGNOSIS — J189 Pneumonia, unspecified organism: Secondary | ICD-10-CM | POA: Diagnosis not present

## 2017-11-24 DIAGNOSIS — R0600 Dyspnea, unspecified: Secondary | ICD-10-CM | POA: Diagnosis not present

## 2017-11-24 DIAGNOSIS — R0602 Shortness of breath: Secondary | ICD-10-CM | POA: Diagnosis not present

## 2017-11-24 DIAGNOSIS — J9601 Acute respiratory failure with hypoxia: Secondary | ICD-10-CM | POA: Diagnosis not present

## 2017-11-24 DIAGNOSIS — M6250 Muscle wasting and atrophy, not elsewhere classified, unspecified site: Secondary | ICD-10-CM | POA: Diagnosis not present

## 2017-11-24 DIAGNOSIS — F419 Anxiety disorder, unspecified: Secondary | ICD-10-CM | POA: Diagnosis present

## 2017-11-24 DIAGNOSIS — Z720 Tobacco use: Secondary | ICD-10-CM | POA: Diagnosis present

## 2017-11-24 LAB — COMPREHENSIVE METABOLIC PANEL
ALK PHOS: 95 U/L (ref 38–126)
ALT: 8 U/L — ABNORMAL LOW (ref 14–54)
AST: 25 U/L (ref 15–41)
Albumin: 2.9 g/dL — ABNORMAL LOW (ref 3.5–5.0)
Anion gap: 13 (ref 5–15)
BUN: 14 mg/dL (ref 6–20)
CALCIUM: 8.8 mg/dL — AB (ref 8.9–10.3)
CO2: 22 mmol/L (ref 22–32)
Chloride: 96 mmol/L — ABNORMAL LOW (ref 101–111)
Creatinine, Ser: 0.81 mg/dL (ref 0.44–1.00)
GFR calc Af Amer: >60 mL/min (ref >60)
GFR calc non Af Amer: >60 mL/min (ref >60)
Glucose, Bld: 97 mg/dL (ref 65–99)
Potassium: 4.5 mmol/L (ref 3.5–5.1)
SODIUM: 131 mmol/L — AB (ref 135–145)
TOTAL PROTEIN: 6.7 g/dL (ref 6.5–8.1)
Total Bilirubin: 0.9 mg/dL (ref 0.3–1.2)

## 2017-11-24 LAB — CBC
HCT: 35.8 % — ABNORMAL LOW (ref 36.0–46.0)
HEMOGLOBIN: 12 g/dL (ref 12.0–15.0)
MCH: 30.8 pg (ref 26.0–34.0)
MCHC: 33.5 g/dL (ref 30.0–36.0)
MCV: 92 fL (ref 78.0–100.0)
Platelets: 343 10*3/uL (ref 150–400)
RBC: 3.89 MIL/uL (ref 3.87–5.11)
RDW: 13.6 % (ref 11.5–15.5)
WBC: 14.2 10*3/uL — ABNORMAL HIGH (ref 4.0–10.5)

## 2017-11-24 LAB — I-STAT TROPONIN, ED: TROPONIN I, POC: 0.01 ng/mL (ref 0.00–0.08)

## 2017-11-24 LAB — BRAIN NATRIURETIC PEPTIDE: B NATRIURETIC PEPTIDE 5: 418.6 pg/mL — AB (ref 0.0–100.0)

## 2017-11-24 LAB — I-STAT CG4 LACTIC ACID, ED
LACTIC ACID, VENOUS: 1.26 mmol/L (ref 0.5–1.9)
LACTIC ACID, VENOUS: 1.31 mmol/L (ref 0.5–1.9)

## 2017-11-24 LAB — TSH: TSH: 3.479 u[IU]/mL (ref 0.350–4.500)

## 2017-11-24 MED ORDER — TRAZODONE HCL 50 MG PO TABS: 25.0000 mg | ORAL_TABLET | Freq: Every evening | ORAL | Status: DC | PRN

## 2017-11-24 MED ORDER — DILTIAZEM LOAD VIA INFUSION
10.0000 mg | Freq: Once | INTRAVENOUS | Status: AC
  Administered 2017-11-24: 10 mg via INTRAVENOUS
  Filled 2017-11-24: qty 10

## 2017-11-24 MED ORDER — BENZONATATE 100 MG PO CAPS
200.0000 mg | ORAL_CAPSULE | Freq: Three times a day (TID) | ORAL | Status: DC | PRN
  Administered 2017-11-25 – 2017-11-26 (×2): 200 mg via ORAL
  Filled 2017-11-24 (×2): qty 2

## 2017-11-24 MED ORDER — ACETAMINOPHEN 325 MG PO TABS: 650.0000 mg | ORAL_TABLET | Freq: Four times a day (QID) | ORAL | Status: DC | PRN

## 2017-11-24 MED ORDER — SODIUM CHLORIDE 0.9 % IV SOLN
500.0000 mg | Freq: Once | INTRAVENOUS | Status: AC
  Administered 2017-11-24: 500 mg via INTRAVENOUS
  Filled 2017-11-24: qty 500

## 2017-11-24 MED ORDER — SENNOSIDES-DOCUSATE SODIUM 8.6-50 MG PO TABS: 1.0000 | ORAL_TABLET | Freq: Every evening | ORAL | Status: DC | PRN

## 2017-11-24 MED ORDER — DILTIAZEM HCL-DEXTROSE 100-5 MG/100ML-% IV SOLN (PREMIX)
5.0000 mg/h | INTRAVENOUS | Status: DC
  Administered 2017-11-24: 5 mg/h via INTRAVENOUS
  Filled 2017-11-24: qty 100

## 2017-11-24 MED ORDER — ONDANSETRON HCL 4 MG/2ML IJ SOLN: 4.0000 mg | Freq: Four times a day (QID) | INTRAMUSCULAR | Status: DC | PRN

## 2017-11-24 MED ORDER — ONDANSETRON HCL 4 MG PO TABS: 4.0000 mg | ORAL_TABLET | Freq: Four times a day (QID) | ORAL | Status: DC | PRN

## 2017-11-24 MED ORDER — SODIUM CHLORIDE 0.9 % IV SOLN
1.0000 g | Freq: Once | INTRAVENOUS | Status: AC
  Administered 2017-11-24: 1 g via INTRAVENOUS
  Filled 2017-11-24: qty 10

## 2017-11-24 MED ORDER — ENSURE ENLIVE PO LIQD
237.0000 mL | Freq: Two times a day (BID) | ORAL | Status: DC
  Administered 2017-11-25: 237 mL via ORAL

## 2017-11-24 MED ORDER — IPRATROPIUM-ALBUTEROL 0.5-2.5 (3) MG/3ML IN SOLN
3.0000 mL | Freq: Four times a day (QID) | RESPIRATORY_TRACT | Status: DC | PRN
Start: 2017-11-24 — End: 2017-11-25
  Administered 2017-11-25: 3 mL via RESPIRATORY_TRACT
  Filled 2017-11-24: qty 3

## 2017-11-24 MED ORDER — ENOXAPARIN SODIUM 40 MG/0.4ML ~~LOC~~ SOLN
40.0000 mg | SUBCUTANEOUS | Status: DC
  Filled 2017-11-24 (×2): qty 0.4

## 2017-11-24 MED ORDER — ACETAMINOPHEN 650 MG RE SUPP: 650.0000 mg | Freq: Four times a day (QID) | RECTAL | Status: DC | PRN

## 2017-11-24 MED ORDER — PIPERACILLIN-TAZOBACTAM 3.375 G IVPB
3.3750 g | Freq: Three times a day (TID) | INTRAVENOUS | Status: DC
  Administered 2017-11-24 – 2017-11-26 (×4): 3.375 g via INTRAVENOUS
  Filled 2017-11-24 (×6): qty 50

## 2017-11-24 NOTE — ED Triage Notes (Signed)
Per EMS: pt from home with c/o sob and non-productive cough x weeks. Pt recently dx with Right lung cancer.  Upon EMS arrival pt Sp02 in the low 80's, placed on non-rebreather 4L and spo2 returned to 96%.  PTA vitals: 169/98, 102 pulse, 94-96% on RA.

## 2017-11-24 NOTE — Progress Notes (Signed)
Pharmacy Antibiotic Note  Brenda Gardner is a 84 y.o. female admitted on 11/24/2017 with SOB.  Patient has lung cancer being treated with radiation. Pharmacy has been consulted for Zosyn dosing for post-obstructive pneumonitis.  Patient received azithromycin and ceftriaxone in the ED.  Renal function stable.  Afebrile, WBC 14.2, LA 1.31.   Plan: Zosyn EID 3.375gm IV Q8H Pharmacy will sign off as dosage adjustment is likely unnecessary.  Thank you for the consult!  Height: 5\' 6"  (167.6 cm) Weight: 110 lb 3.7 oz (50 kg) IBW/kg (Calculated) : 59.3  Temp (24hrs), Avg:97.9 F (36.6 C), Min:97.5 F (36.4 C), Max:98.3 F (36.8 C)  Recent Labs  Lab 11/24/17 1054 11/24/17 1122 11/24/17 1414  WBC 14.2*  --   --   CREATININE 0.81  --   --   LATICACIDVEN  --  1.26 1.31    Estimated Creatinine Clearance: 42.3 mL/min (by C-G formula based on SCr of 0.81 mg/dL).    Allergies  Allergen Reactions  . Lisinopril Swelling    Swelling of tongue    Zosyn 3/25 >>    Windi Toro D. Mina Marble, PharmD, BCPS Pager:  [phone] 11/24/2017, 10:21 PM

## 2017-11-24 NOTE — Progress Notes (Signed)
Pharmacy Antibiotic Note  Brenda Gardner is a 84 y.o. female admitted on 11/24/2017 with pneumonia.  Pharmacy has been consulted for zosyn dosing.  Plan: Zosyn 3.375g IV q8h (4 hour infusion).  F/u renal function, cultures and clinical course  Height: 5\' 6"  (167.6 cm) Weight: 110 lb 3.7 oz (50 kg) IBW/kg (Calculated) : 59.3  Temp (24hrs), Avg:97.9 F (36.6 C), Min:97.5 F (36.4 C), Max:98.3 F (36.8 C)  Recent Labs  Lab 11/24/17 1054 11/24/17 1122 11/24/17 1414  WBC 14.2*  --   --   CREATININE 0.81  --   --   LATICACIDVEN  --  1.26 1.31    Estimated Creatinine Clearance: 42.3 mL/min (by C-G formula based on SCr of 0.81 mg/dL).    Allergies  Allergen Reactions  . Lisinopril Swelling    Swelling of tongue    Antimicrobials this admission: rocephin 3/25 x 1 dose azithromycin 3/25 x 1 dose Zosyn  3/25>>  Thank you for allowing pharmacy to be a part of this patient's care.  Beverlee Nims 11/24/2017 10:17 PM

## 2017-11-24 NOTE — ED Notes (Signed)
Ordered diet tray for pt  

## 2017-11-24 NOTE — Progress Notes (Signed)
Date: 11/24/2017               Patient Name:  Brenda Gardner MRN: [id number]  DOB: September 24, 1932 Age / Sex: 84 y.o., female   PCP: Patient, No Pcp Per              Medical Service: Internal Medicine Teaching Service              Attending Physician: Dr. Noemi Chapel, MD    First Contact: Dr Rochele Pages, MD Pager: [phone]  Second Contact: Dr. Einar Gip, MD Pager: [phone]            After Hours (After 5p/  First Contact Pager: [phone]  weekends / holidays): Second Contact Pager: [phone]   Chief Complaint: Shortness of breath  History of Present Illness:   Brenda Gardner is a 84 y.o. female with past medical history significant for PET + lung nodule and tongue soft tissue mass s/p resection and radiation, tobacco use and HTN who presents today to the ED due to shortness of breath for the past 1.5 weeks that has acutely worsened and a chronic cough.   The shortness of breath is worse when laying flat so she sleeps with 3 pillows underneath her which provides some relief. Her shortness of breath is worse with exertion. The daughter reports that the patient's O2 saturation has fallen into the low 80% range with exertion.   The patient has also been dealing with a chronic non-productive cough that has been going for about 2 months now.  It has been nonproductive. The patient went and saw her PCP on 2/28 for the chronic cough chronic cough. As part of the work-up, the PCP scheduled a CXR which showed a right apical mass concerning for malignancy given her past medical history of lung cancer. Further work-up was done and a CT of the chest showed a 7 cm mass at the right apex with bulky hilar and mediastinal adenopathy.  There also was a small right pleural effusion. She was seen by a cardiothoracic surgeon on 3/15 who reviewed her imaging and concluded that her presentation was most consistent with primary bronchogenic carcinoma and at least stage IIIA disease. If the small to moderate  right pleural effusion is malignant, then findings are consistent with stage IV disease. He also concluded that surgery is not an option for treatment and recommended a complete a metastatic workup with a PET/CT, brain MRI, and CT-guided biopsy.  She was subsequently referred to the multidisciplinary thoracic oncology clinic and was scheduled to receive the metastatic workup later this week.    The patient's daughter reports that Ms. Berkery continues to smoke between 15-20 cigarettes per day. The daughter also reports that her mother drinks about 1-2 drinks per night. The patient denies any changes in vision, muscular weakness, numbness, or loss of sensation. She denies any chest pain or palpitations. She denies any runny nose, stuffy nose, sore throat, sneezing. She denies any vomiting, nausea, abdominal pain, or diarrhea. She denies any dysuria, hematuria, or incontinence. The daughter reports that patient's diet has deteriorated over the last few weeks. She has poor PO intake. Daughter usually gives her ensure shakes.   ED Course  Upon EMS arrival the patient's Sp02 were in the low 80's on RA. She was placed on 4L of nasal cannula  and her Spo2 increased to 96%. Patient's blood pressure was 169/98 and pulse 102 .  Labs were remarkable for BNP 418.6, moderate leukocytosis (14.2), slightly low hct (  35.8), Na 131, Cl 96, albumin 2.9 An EKG was ordered and showed atrial fibrillation. Patient was subsequently started on diltiazem drip. She is now in sinus rhythm. The patient was also started on empiric abx due to high clinical suspicion for pneumonia given her worsening cough and elevated WBC.    Meds: Current Facility-Administered Medications  Medication Dose Route Frequency Provider Last Rate Last Dose  . diltiazem (CARDIZEM) 100 mg in dextrose 5% 187mL (1 mg/mL) infusion  5-15 mg/hr Intravenous Continuous McDonald, Mia A, PA-C 5 mL/hr at 11/24/17 1126 5 mg/hr at 11/24/17 1126   Current Outpatient  Medications  Medication Sig Dispense Refill  . acetaminophen (TYLENOL) 325 MG tablet Take 650 mg by mouth every 6 (six) hours as needed for mild pain or moderate pain.     Marland Kitchen ENSURE PLUS (ENSURE PLUS) LIQD Take 237 mLs by mouth daily.    Marland Kitchen ibuprofen (ADVIL,MOTRIN) 100 MG chewable tablet Chew 200 mg by mouth every 8 (eight) hours as needed for mild pain.       Allergies: Allergies as of 11/24/2017 - Review Complete 11/24/2017  Allergen Reaction Noted  . Lisinopril Swelling 08/12/2011   Past Medical History:  Diagnosis Date  . Anxiety    SINCE THIS IS COMING UP  . Arthritis   . Cancer ([place])   . Cataract   . Complication of anesthesia   . Hemochromatosis   . Hypertension   . Lesion of left lung    ONLY DOING RADIATION  . PONV (postoperative nausea and vomiting)    54 YRS AGO DURING CHILDBIRTH  . Radiation 03/15/14-03/28/14   left apical nodule 50 gray   Past Surgical History:  Procedure Laterality Date  . APPENDECTOMY    . EYE SURGERY     BIL CATARACTS  . GLOSSECTOMY Left 01/07/2014   Procedure: PARTIAL GLOSSECTOMY;  Surgeon: [name], MD;  Location: [facility];  Service: ENT;  Laterality: Left;  . RADICAL NECK DISSECTION Left 01/07/2014   Procedure: LEFT  NECK DISSECTION WITH FROZEN SECTION;  Surgeon: [name], MD;  Location: [facility];  Service: ENT;  Laterality: Left;  . TONSILLECTOMY    . TUBAL LIGATION    . VEIN LIGATION Bilateral 1980's   Family History  Problem Relation Age of Onset  . Mesothelioma Father    Social History   Socioeconomic History  . Marital status: Divorced    Spouse name: Not on file  . Number of children: 5  . Years of education: Not on file  . Highest education level: Not on file  Occupational History  . Not on file  Social Needs  . Financial resource strain: Not on file  . Food insecurity:    Worry: Not on file    Inability: Not on file  . Transportation needs:    Medical: Not on file    Non-medical: Not on file  Tobacco Use  . Smoking  status: Current Every Day Smoker    Packs/day: 0.50    Years: 25.00    Pack years: 12.50    Types: Cigarettes  . Smokeless tobacco: Never Used  Substance and Sexual Activity  . Alcohol use: Yes    Alcohol/week: 3.5 oz    Types: 7 Standard drinks or equivalent per week  . Drug use: No  . Sexual activity: Not on file  Lifestyle  . Physical activity:    Days per week: Not on file    Minutes per session: Not on file  . Stress: Not on  file  Relationships  . Social connections:    Talks on phone: Not on file    Gets together: Not on file    Attends religious service: Not on file    Active member of club or organization: Not on file    Attends meetings of clubs or organizations: Not on file    Relationship status: Not on file  . Intimate partner violence:    Fear of current or ex partner: Not on file    Emotionally abused: Not on file    Physically abused: Not on file    Forced sexual activity: Not on file  Other Topics Concern  . Not on file  Social History Narrative  . Not on file    Review of Systems: Pertinent items noted in HPI and remainder of comprehensive ROS otherwise negative.  Physical Exam: Blood pressure (!) 128/59, pulse 74, temperature (!) 97.5 F (36.4 C), temperature source Oral, resp. rate (!) 28, SpO2 98 %. BP 132/63   Pulse 75   Temp (!) 97.5 F (36.4 C) (Oral)   Resp (!) 28   SpO2 99%  General appearance: alert, cooperative, appears stated age and no distress Head: Normocephalic, without obvious abnormality, atraumatic Eyes: conjunctivae/corneas clear. PERRL, EOM's intact. Fundi benign. Ears: normal TM's and external ear canals both ears Nose: Nares normal. Septum midline. Mucosa normal. No drainage or sinus tenderness. Throat: lips, mucosa, and tongue normal; teeth and gums normal Lungs: clear to auscultation bilaterally Heart: regular rate and rhythm, S1, S2 normal, no murmur, click, rub or gallop Abdomen: soft, non-tender; bowel sounds normal;  no masses,  no organomegaly Pulses: 2+ and symmetric Skin: Skin color, texture, turgor normal. No rashes or lesions except for pressure wound on left ankle Neurologic: Alert and oriented X 3, normal strength and tone. Normal symmetric reflexes. Normal coordination and gait  Lab results: @LABTEST @  Imaging results:  Dg Chest 2 View  Result Date: 11/24/2017 CLINICAL DATA:  Shortness of breath and cough EXAM: CHEST - 2 VIEW COMPARISON:  10/30/2017 plain film, 11/04/2017 CT of the chest FINDINGS: Known right upper lobe mass lesion is again identified. Right hilar adenopathy is noted. Right pleural effusion is noted which appears larger than that seen on recent CT. New small left pleural effusion is noted. The remainder of the left lung is clear. Cardiac shadow is within normal limits. IMPRESSION: Known right upper lobe lung mass with associated effusion. New left pleural effusion is noted. Electronically Signed   By: Inez Catalina M.D.   On: 11/24/2017 13:17    Other results: EKG: atrial fibrillation, rate 116 BPM upon arrival to the ED  Assessment & Plan by Problem:  Brenda Gardner is a 84 y.o. female with past medical history significant for PET + lung nodule and tongue soft tissue mass s/p resection and radiation, tobacco use and HTN who presents today to the ED due to shortness of breath for the past 1.5 weeks that has acutely worsened and a chronic cough. Patient was recently seen by cardiothoracic surgery on 3/15 due to a right apical mass seen on CXR. Work-up revealed a large right apical mass with bulky hilar and mediastinal adenopathy and a pleural effusion. Per Dr. Roxan Hockey, this almost certainly is a new primary bronchogenic carcinoma. Clinical stage IV (T3, N2, M1).   Shortness of breath/Chronic non-productive cough The patient's acute worsening shortness of breath began 1.5 weeks ago. The shortness of breath is continuous, worsens with exertion, and does not resolve with rest. This  is  likely due to extrinsic compression on the right mainstem bronchus, bronchus intermedius and right upper and right middle lobe bronchi by the right apical mass. Development of a treatment plan for new cancer diagnosis is the most appropriate next step:  -Discontinue IV azithromycin -PET/CT to guide an initial diagnostic workup. -MR brain to rule out metastases. -CT-guided biopsy after PET. -Referral to multidisciplinary thoracic oncology clinic to see Dr. Julien Nordmann and Dr. Sondra Come.  Atrial fibrillation EKG was done on arrival to the ED and showed atrial fibrillation. Patient was subsequently started on diltiazem drip. She is now in sinus rhythm. Patient has no prior history of atrial fibrillation or arrhythmias.  -Discontinue diltiazem drip and continue to monitor via telemetry.  This is a Careers information officer Note.  The care of the patient was discussed with Dr. Aggie Hacker and the assessment and plan was formulated with their assistance.  Please see their note for official documentation of the patient encounter.   Signed: Jenell Milliner, Medical Student 11/24/2017, 3:53 PM

## 2017-11-24 NOTE — ED Notes (Signed)
Pt placed on hospital bed

## 2017-11-24 NOTE — ED Provider Notes (Signed)
The patient is an 84 year old female, recently diagnosed with lung cancer, presents with worsening coughing and shortness of breath and on my exam does not fact have rales at the left base.  She does have bilateral symmetrical mild edema which is chronic with some stasis dermatitis as well.  Her abdomen is soft, her heart sounds are irregular and she is in atrial fibrillation with a rapid ventricular rate.  She will need the following test  X-ray of the chest Blood work EKG Cardiac monitoring Cardiac medications including diltiazem to slow her rate   I suspect she will need antibiotics if she has a pneumonia, if there is no pneumonia we would consider pulmonary embolism as well.  .Critical Care Performed by: Noemi Chapel, MD Authorized by: Noemi Chapel, MD   Critical care provider statement:    Critical care time (minutes):  35   Critical care time was exclusive of:  Separately billable procedures and treating other patients and teaching time   Critical care was necessary to treat or prevent imminent or life-threatening deterioration of the following conditions:  Cardiac failure   Critical care was time spent personally by me on the following activities:  Blood draw for specimens, development of treatment plan with patient or surrogate, discussions with consultants, evaluation of patient's response to treatment, examination of patient, obtaining history from patient or surrogate, ordering and performing treatments and interventions, ordering and review of laboratory studies, ordering and review of radiographic studies, pulse oximetry, re-evaluation of patient's condition and review of old charts    EKG Interpretation  Date/Time:  Monday November 24 2017 10:32:51 EDT Ventricular Rate:  116 PR Interval:    QRS Duration: 90 QT Interval:  342 QTC Calculation: 476 R Axis:   -59 Text Interpretation:  Atrial fibrillation Left anterior fascicular block Abnormal R-wave progression, early  transition Repol abnrm suggests ischemia, anterolateral Since last tracing rate and rhythm have changed, no hx of afib Confirmed by Noemi Chapel [phone]) on 11/24/2017 11:05:13 AM        Medical screening examination/treatment/procedure(s) were conducted as a shared visit with non-physician practitioner(s) and myself.  I personally evaluated the patient during the encounter.  Clinical Impression:   Final diagnoses:  Sepsis, due to unspecified organism [facility])  Community acquired pneumonia of left lower lobe of lung ([place])  Atrial fibrillation with rapid ventricular response ([place])  Hypoxia          Noemi Chapel, MD 11/24/17 2114

## 2017-11-24 NOTE — ED Notes (Signed)
Pt has dinner tray at bedside.

## 2017-11-24 NOTE — ED Notes (Signed)
Attempted report x1. 

## 2017-11-24 NOTE — ED Notes (Signed)
ED Provider at bedside. 

## 2017-11-24 NOTE — ED Provider Notes (Signed)
[facility] EMERGENCY DEPARTMENT Provider Note   CSN: [id number] Arrival date & time: 11/24/17  1021     History   Chief Complaint Chief Complaint  Patient presents with  . Shortness of Breath  . Cough    HPI Brenda Gardner is a 84 y.o. female with a h/o of HTN, RUL mass, hemochromatosis presenting with dyspnea.   She reports constant dyspnea over that last week, significantly worsening over the last 2 days. Dyspnea is somewhat improved with sitting still and worse with activity. She also endorses a non-productive cough over the last few weeks.   No fever, chills, chest pain, palpitations, rash, dizziness, or lightheadedness.   EMS reports SaO2 in the low 80s on arrival, improving to 96% after being placed on a 4L non-rebreather. In the ED, she is tachypneic and tachycardic in the 110-120s.   She was seen by her PCP on 3/21 for dyspnea. She was diagnosed with RUL mass earlier this month and is scheduled for a PET scan and MRI brain in the next week.   The history is provided by the patient. No language interpreter was used.  Shortness of Breath  This is a new problem. The average episode lasts 1 week. The problem occurs continuously.The current episode started more than 2 days ago. The problem has been rapidly worsening. Associated symptoms include cough. Pertinent negatives include no fever, no headaches, no neck pain, no chest pain, no vomiting, no abdominal pain and no rash.  Cough  Associated symptoms include shortness of breath. Pertinent negatives include no chest pain, no chills and no headaches.    Past Medical History:  Diagnosis Date  . Anxiety    SINCE THIS IS COMING UP  . Arthritis   . Cancer ([place])   . Cataract   . Complication of anesthesia   . Hemochromatosis   . Hypertension   . Lesion of left lung    ONLY DOING RADIATION  . PONV (postoperative nausea and vomiting)    54 YRS AGO DURING CHILDBIRTH  . Radiation 03/15/14-03/28/14   left  apical nodule 50 gray    Patient Active Problem List   Diagnosis Date Noted  . Protein-calorie malnutrition, severe ([employer]) 01/09/2014  . Tongue cancer ([place]) 01/07/2014  . Hypertension 08/06/2013  . Tobacco abuse 08/06/2013  . Nodule of left lung 12/15/2012  . Weight loss 09/29/2012  . Cough 09/29/2012  . Hemochromatosis, hereditary ([place]) 08/14/2011    Past Surgical History:  Procedure Laterality Date  . APPENDECTOMY    . EYE SURGERY     BIL CATARACTS  . GLOSSECTOMY Left 01/07/2014   Procedure: PARTIAL GLOSSECTOMY;  Surgeon: [name], MD;  Location: [facility];  Service: ENT;  Laterality: Left;  . RADICAL NECK DISSECTION Left 01/07/2014   Procedure: LEFT  NECK DISSECTION WITH FROZEN SECTION;  Surgeon: [name], MD;  Location: [facility];  Service: ENT;  Laterality: Left;  . TONSILLECTOMY    . TUBAL LIGATION    . VEIN LIGATION Bilateral 1980's     OB History   None      Home Medications    Prior to Admission medications   Medication Sig Start Date End Date Taking? Authorizing Provider  acetaminophen (TYLENOL) 325 MG tablet Take 650 mg by mouth every 6 (six) hours as needed for mild pain or moderate pain.    Yes [provider]  ENSURE PLUS (ENSURE PLUS) LIQD Take 237 mLs by mouth daily.   Yes [provider]  ibuprofen (ADVIL,MOTRIN) 100 MG chewable tablet Chew 200 mg by mouth every 8 (eight) hours as needed for mild pain.    Yes [provider]    Family History Family History  Problem Relation Age of Onset  . Mesothelioma Father     Social History Social History   Tobacco Use  . Smoking status: Current Every Day Smoker    Packs/day: 0.50    Years: 25.00    Pack years: 12.50    Types: Cigarettes  . Smokeless tobacco: Never Used  Substance Use Topics  . Alcohol use: Yes    Alcohol/week: 3.5 oz    Types: 7 Standard drinks or equivalent per week  . Drug use: No     Allergies   Lisinopril   Review of Systems Review of Systems    Constitutional: Negative for activity change, chills and fever.  HENT: Negative for congestion, sinus pressure and sinus pain.   Eyes: Negative for visual disturbance.  Respiratory: Positive for cough and shortness of breath.   Cardiovascular: Negative for chest pain.  Gastrointestinal: Negative for abdominal pain, diarrhea, nausea and vomiting.  Genitourinary: Negative for dysuria.  Musculoskeletal: Negative for back pain, neck pain and neck stiffness.  Skin: Negative for rash.  Allergic/Immunologic: Negative for immunocompromised state.  Neurological: Negative for weakness, numbness and headaches.  Psychiatric/Behavioral: Negative for confusion.   Physical Exam Updated Vital Signs BP 132/63   Pulse 75   Temp (!) 97.5 F (36.4 C) (Oral)   Resp (!) 28   SpO2 99%   Physical Exam  Constitutional: She is oriented to person, place, and time. No distress.  Cachetic appearing elderly female   HENT:  Head: Normocephalic.  Right Ear: External ear normal.  Left Ear: External ear normal.  Mouth/Throat: Oropharynx is clear and moist.  Eyes: Pupils are equal, round, and reactive to light. Conjunctivae and EOM are normal. Right eye exhibits no discharge. Left eye exhibits no discharge. No scleral icterus.  Neck: Normal range of motion. Neck supple. No JVD present.  Cardiovascular: Normal rate, regular rhythm, normal heart sounds and intact distal pulses. Exam reveals no gallop and no friction rub.  No murmur heard. Pulmonary/Chest: Effort normal. No stridor. No respiratory distress. She has no wheezes.  Crackles auscultated in the base of the left lower lung. Conversational dyspnea.   Abdominal: Soft. She exhibits no distension and no mass. There is no tenderness. There is no rebound and no guarding. No hernia.  Musculoskeletal: Normal range of motion. She exhibits no edema, tenderness or deformity.  Neurological: She is alert and oriented to person, place, and time.  Skin: Skin is warm.  Capillary refill takes less than 2 seconds.  Venous stasis dermatitis on the bilateral lower extremities. There is a wound on the left lateral ankle with 3 cm of erythema and a small amount of purulence drained.   Psychiatric: Her behavior is normal.  Nursing note and vitals reviewed.    ED Treatments / Results  Labs (all labs ordered are listed, but only abnormal results are displayed) Labs Reviewed  COMPREHENSIVE METABOLIC PANEL - Abnormal; Notable for the following components:      Result Value   Sodium 131 (*)    Chloride 96 (*)    Calcium 8.8 (*)    Albumin 2.9 (*)    ALT 8 (*)    All other components within normal limits  CBC - Abnormal; Notable for the following components:   WBC 14.2 (*)    HCT 35.8 (*)  All other components within normal limits  BRAIN NATRIURETIC PEPTIDE - Abnormal; Notable for the following components:   B Natriuretic Peptide 418.6 (*)    All other components within normal limits  URINALYSIS, ROUTINE W REFLEX MICROSCOPIC  I-STAT TROPONIN, ED  I-STAT CG4 LACTIC ACID, ED  I-STAT CG4 LACTIC ACID, ED    EKG EKG Interpretation  Date/Time:  Monday November 24 2017 10:32:51 EDT Ventricular Rate:  116 PR Interval:    QRS Duration: 90 QT Interval:  342 QTC Calculation: 476 R Axis:   -59 Text Interpretation:  Atrial fibrillation Left anterior fascicular block Abnormal R-wave progression, early transition Repol abnrm suggests ischemia, anterolateral Since last tracing rate and rhythm have changed, no hx of afib Confirmed by Noemi Chapel [phone]) on 11/24/2017 11:05:13 AM   Radiology Dg Chest 2 View  Result Date: 11/24/2017 CLINICAL DATA:  Shortness of breath and cough EXAM: CHEST - 2 VIEW COMPARISON:  10/30/2017 plain film, 11/04/2017 CT of the chest FINDINGS: Known right upper lobe mass lesion is again identified. Right hilar adenopathy is noted. Right pleural effusion is noted which appears larger than that seen on recent CT. New small left pleural  effusion is noted. The remainder of the left lung is clear. Cardiac shadow is within normal limits. IMPRESSION: Known right upper lobe lung mass with associated effusion. New left pleural effusion is noted. Electronically Signed   By: Inez Catalina M.D.   On: 11/24/2017 13:17    Procedures .Critical Care Performed by: Joanne Gavel, PA-C Authorized by: Joanne Gavel, PA-C   Critical care provider statement:    Critical care time (minutes):  35   Critical care time was exclusive of:  Separately billable procedures and treating other patients and teaching time   Critical care was necessary to treat or prevent imminent or life-threatening deterioration of the following conditions:  Cardiac failure   Critical care was time spent personally by me on the following activities:  Ordering and review of radiographic studies, ordering and review of laboratory studies, pulse oximetry, re-evaluation of patient's condition, obtaining history from patient or surrogate, examination of patient, evaluation of patient's response to treatment and ordering and performing treatments and interventions    Medications Ordered in ED Medications  diltiazem (CARDIZEM) 1 mg/mL load via infusion 10 mg (10 mg Intravenous Bolus from Bag 11/24/17 1126)    And  diltiazem (CARDIZEM) 100 mg in dextrose 5% 162mL (1 mg/mL) infusion (5 mg/hr Intravenous New Bag/Given 11/24/17 1126)  cefTRIAXone (ROCEPHIN) 1 g in sodium chloride 0.9 % 100 mL IVPB (0 g Intravenous Stopped 11/24/17 1440)  azithromycin (ZITHROMAX) 500 mg in sodium chloride 0.9 % 250 mL IVPB (0 mg Intravenous Stopped 11/24/17 1629)     Initial Impression / Assessment and Plan / ED Course  I have reviewed the triage vital signs and the nursing notes.  Pertinent labs & imaging results that were available during my care of the patient were reviewed by me and considered in my medical decision making (see chart for details).     84 year old female with a h/o of HTN,  RUL mass, hemochromatosis presenting with dyspnea. She has conversational dyspnea and crackles in the LLQ.   EKG with Atrial Fibrillation. No h/o of prior. 10 mg loading dose of diltiazem and 100 mg infusion given with resolution of A fib. Troponin is negative. Tachycardia improved from 120s to 70s. She is hemodynamically stable.   WBC with 14.2. Mild hyponatremia of 131. Mildly elevated BNP at 419. CXR  with new small left pleural effusion. Azithromycin and Ceftriaxone given in the ED. Will admit the patient for sepsis secondary to pulmonary source. She was seen and evaluated with Dr. Sabra Heck, attending physician who is in agreement with the workup and plan.  Spoke with Dr. Danford Bad, internal medicine, who will accept the patient for admission.The patient appears reasonably stabilized for admission considering the current resources, flow, and capabilities available in the ED at this time, and I doubt any other [facility] requiring further screening and/or treatment in the ED prior to admission.  Final Clinical Impressions(s) / ED Diagnoses   Final diagnoses:  Sepsis, due to unspecified organism [facility])  Community acquired pneumonia of left lower lobe of lung [facility])  Atrial fibrillation with rapid ventricular response [facility])    ED Discharge Orders    None       Joanne Gavel, PA-C 11/24/17 1655    Noemi Chapel, MD 11/24/17 2117

## 2017-11-24 NOTE — H&P (Addendum)
Date: 11/24/2017               Patient Name:  Brenda Gardner MRN: [id number]  DOB: June 21, 1933 Age / Sex: 84 y.o., female   PCP: Patient, No Pcp Per         Medical Service: Internal Medicine Teaching Service         Attending Physician: Dr. Rebeca Alert, Raynaldo Opitz, MD    First Contact: Dr. Aggie Hacker Pager: [phone]  Second Contact: Dr. Danford Bad Pager: [phone]       After Hours (After 5p/  First Contact Pager: [phone]  weekends / holidays): Second Contact Pager: [phone]   Chief Complaint: SOB  History of Present Illness:  84 yo female with PMH significant for tobacco use, cancer of the tongue, stage IA lung cancer left upper lobe treated with stereotactic radiation, and hypertension presenting with the chief  complaint of shortness of breath. Patient is accompanied by her daughter. Per patient's daughter her mother has acutely decompensated within the last week or so. She states she has had increasing DOE since last week. She has had to place a bedside commode for the patient because she has been experiencing generalized weakness and shortness of breath on exertion. The patient's daughter had been monitoring the patient's O2 sats with a home pulse ox, which showed the patient desaturating into the 70s-80s with exertion or coughing.   The patient has also been experiencing a dry cough for several months. She went to her primary care provider who empirically treated  Her for CAP and got a CXR. CXR revealed a large right upper lobe mass with right hilar lymphadenopathy. A follow up CT scan of the chest was performed and showed right upper lobe mass with postobstructive pneumonitis in the right upper lobe and bulky right hilar/ipsilateral mediastinal adenopathy, most consistent with primary bronchogenic carcinoma and at least stage IIIA disease. She was referred to CT surgery, who deemed the patient not a surgical candidate. She has an appointment with Oncology in beginning of April. She was scheduled  for brain MRI and PET this week.   She also endorses decrease appetite and PO intake. She denies dysphagia or odynophagia. Denies nausea, vomiting, diarrhea, fevers, chills, dysuria, or hematuria. She denies chest pain. She denies palpitations or sensation of her heart racing. No history of afib or CHF.    ED Course: Vitals: Blood pressure 172/93, pulse 121, temperature (!) 97.5 F (36.4 C), resp. rate (!) 23, SpO2 99 %. (reportedly hypoxic to 80s en route with EMS) - patient in afib with RVR Labs: Na 131, K 4.5, Cr 0.81 Ca 8.8, Albumin 2.9, AST 25, ALT 8, ALK 95; WBC 14.2, Hgb 12.0; I-stat troponin negative, BNP 418.6, LA 1.26 Meds: azithro, cftx, diltiazem gtt    Meds:  Current Meds  Medication Sig  . acetaminophen (TYLENOL) 325 MG tablet Take 650 mg by mouth every 6 (six) hours as needed for mild pain or moderate pain.   Marland Kitchen ENSURE PLUS (ENSURE PLUS) LIQD Take 237 mLs by mouth daily.  Marland Kitchen ibuprofen (ADVIL,MOTRIN) 100 MG chewable tablet Chew 200 mg by mouth every 8 (eight) hours as needed for mild pain.      Allergies: Allergies as of 11/24/2017 - Review Complete 11/24/2017  Allergen Reaction Noted  . Lisinopril Swelling 08/12/2011   Past Medical History:  Diagnosis Date  . Anxiety    SINCE THIS IS COMING UP  . Arthritis   . Cancer ([place])   . Cataract   .  Complication of anesthesia   . Hemochromatosis   . Hypertension   . Lesion of left lung    ONLY DOING RADIATION  . PONV (postoperative nausea and vomiting)    54 YRS AGO DURING CHILDBIRTH  . Radiation 03/15/14-03/28/14   left apical nodule 50 gray    Family History:  Family History  Problem Relation Age of Onset  . Mesothelioma Father     Social History:  Social History   Tobacco Use  . Smoking status: Current Every Day Smoker    Packs/day: 0.50    Years: 25.00    Pack years: 12.50    Types: Cigarettes  . Smokeless tobacco: Never Used  Substance Use Topics  . Alcohol use: Yes    Alcohol/week: 3.5 oz    Types:  7 Standard drinks or equivalent per week  . Drug use: No    Review of Systems: A complete ROS was negative except as per HPI.   Physical Exam: Blood pressure (!) 141/65, pulse 80, temperature (!) 97.5 F (36.4 C), temperature source Oral, resp. rate (!) 23, SpO2 96 %. Physical Exam  Constitutional: She is oriented to person, place, and time. She appears well-developed and well-nourished. No distress.  Eyes: Conjunctivae are normal. No scleral icterus.  Neck: Normal range of motion. Neck supple.  Cardiovascular: Normal rate, regular rhythm, normal heart sounds and intact distal pulses.  Pulmonary/Chest: No accessory muscle usage. No respiratory distress. She has decreased breath sounds (throughout all lung fields, worse at the  bases bilaterally ). She has no wheezes. She has no rales.  Abdominal: Soft. Bowel sounds are normal. She exhibits no distension. There is no tenderness.  Musculoskeletal: Normal range of motion. She exhibits no edema.  Lymphadenopathy:    She has no cervical adenopathy.  Neurological: She is alert and oriented to person, place, and time. No cranial nerve deficit.  Skin: Skin is warm and dry.   EKG: personally reviewed my interpretation is atrial fibrillation, HR 116  CXR: personally reviewed my interpretation is right upper lobe mass, right hilar lymphadenopathy, bilateral pleural effusions, R>L, right larger than seen on recent CT  Assessment & Plan by Problem: Active Problems:   Mass of upper lobe of right lung  Right Upper Lobe Mass Dyspnea on Exertion CT scan of the chest performed on 3/6 showed right upper lobe mass with postobstructive pneumonitis in the right upper lobe and bulky right hilar/ipsilateral mediastinal adenopathy, most consistent with primary bronchogenic carcinoma. I believe this is contributing to her shortness of breath as the mass is extrinsically compressing on the rightmainstem bronchus, bronchus intermedius and right upper and right  middle lobe bronchi. She is also a long term cigarette smoker with likely COPD given emphysematous changes seen on imaging. The large mass and lack of lung reserve are likely the cause of the patient's hypoxia and shortness of breath. She also has a interval enlargement of her right pleural effusion and a new left small pleural effusion.  -Started on IV abx in ED for CAP, will discontinue azithromycin and ceftriaxone  -Will start Zosyn, concern for post obstructive pneumonia - low grade leukocytosis, cough -Repeat CT chest with contrast to evaluate mass better -Will try and get MRI of brain to evaluate for mets - patient scheduled for work up as outpatient -?If pleural effusion can be tapped - may be therapeutic and diagnostic, may benefit from POCUS tomorrow  -Patient seems deconditioned in setting of lung cancer, will gently hydrate overnight as she appeared dry on  exam and has had decreased PO intake  -Continuous pulse ox; maintain O2 sats > 90% with supplemental O2 -PT/OT  -Tessalon pearls for cough suppresion  Atrial fibrillation with RVR New onset, presented with RVR. No history of atrial fibrillation. Converted to sinus rhythm. CHA2DS2VASC score of 3.  -Cardiac monitoring -If converts back to atrial fibrillation with RVR can try IV metoprolol if HR can tolerate, if sustained HR > 120 can consider restarting diltiazem -On prophylactic Lovenox for now -TSH pending f/u -Will discuss options for anticoagulation   Code Status: Full Code confirmed on admission  Dispo: Admit patient to Inpatient with expected length of stay greater than 2 midnights.  Signed: Melanee Spry, MD 11/24/2017, 6:07 PM  Pager: [phone]

## 2017-11-25 ENCOUNTER — Other Ambulatory Visit: Payer: Self-pay

## 2017-11-25 ENCOUNTER — Observation Stay (HOSPITAL_COMMUNITY): Payer: Medicare Other

## 2017-11-25 ENCOUNTER — Inpatient Hospital Stay (HOSPITAL_COMMUNITY): Payer: Medicare Other

## 2017-11-25 ENCOUNTER — Encounter (HOSPITAL_COMMUNITY): Payer: Self-pay | Admitting: Student

## 2017-11-25 ENCOUNTER — Observation Stay: Admission: RE | Admit: 2017-11-25 | Payer: Medicare Other | Source: Ambulatory Visit

## 2017-11-25 DIAGNOSIS — Z888 Allergy status to other drugs, medicaments and biological substances status: Secondary | ICD-10-CM | POA: Diagnosis not present

## 2017-11-25 DIAGNOSIS — I2510 Atherosclerotic heart disease of native coronary artery without angina pectoris: Secondary | ICD-10-CM | POA: Insufficient documentation

## 2017-11-25 DIAGNOSIS — Z791 Long term (current) use of non-steroidal anti-inflammatories (NSAID): Secondary | ICD-10-CM | POA: Diagnosis not present

## 2017-11-25 DIAGNOSIS — L89529 Pressure ulcer of left ankle, unspecified stage: Secondary | ICD-10-CM | POA: Diagnosis present

## 2017-11-25 DIAGNOSIS — E43 Unspecified severe protein-calorie malnutrition: Secondary | ICD-10-CM | POA: Diagnosis present

## 2017-11-25 DIAGNOSIS — I700 Atherosclerosis of aorta: Secondary | ICD-10-CM

## 2017-11-25 DIAGNOSIS — Z85110 Personal history of malignant carcinoid tumor of bronchus and lung: Secondary | ICD-10-CM | POA: Diagnosis not present

## 2017-11-25 DIAGNOSIS — J188 Other pneumonia, unspecified organism: Secondary | ICD-10-CM | POA: Diagnosis not present

## 2017-11-25 DIAGNOSIS — J9601 Acute respiratory failure with hypoxia: Secondary | ICD-10-CM | POA: Diagnosis present

## 2017-11-25 DIAGNOSIS — R911 Solitary pulmonary nodule: Secondary | ICD-10-CM | POA: Diagnosis not present

## 2017-11-25 DIAGNOSIS — Z6820 Body mass index (BMI) 20.0-20.9, adult: Secondary | ICD-10-CM | POA: Diagnosis not present

## 2017-11-25 DIAGNOSIS — R0902 Hypoxemia: Secondary | ICD-10-CM | POA: Diagnosis present

## 2017-11-25 DIAGNOSIS — J90 Pleural effusion, not elsewhere classified: Secondary | ICD-10-CM | POA: Diagnosis not present

## 2017-11-25 DIAGNOSIS — Z923 Personal history of irradiation: Secondary | ICD-10-CM

## 2017-11-25 DIAGNOSIS — R0600 Dyspnea, unspecified: Secondary | ICD-10-CM | POA: Diagnosis not present

## 2017-11-25 DIAGNOSIS — R590 Localized enlarged lymph nodes: Secondary | ICD-10-CM | POA: Diagnosis present

## 2017-11-25 DIAGNOSIS — Z85810 Personal history of malignant neoplasm of tongue: Secondary | ICD-10-CM

## 2017-11-25 DIAGNOSIS — R918 Other nonspecific abnormal finding of lung field: Secondary | ICD-10-CM

## 2017-11-25 DIAGNOSIS — Z9841 Cataract extraction status, right eye: Secondary | ICD-10-CM | POA: Diagnosis not present

## 2017-11-25 DIAGNOSIS — F17210 Nicotine dependence, cigarettes, uncomplicated: Secondary | ICD-10-CM

## 2017-11-25 DIAGNOSIS — I351 Nonrheumatic aortic (valve) insufficiency: Secondary | ICD-10-CM

## 2017-11-25 DIAGNOSIS — F419 Anxiety disorder, unspecified: Secondary | ICD-10-CM | POA: Diagnosis present

## 2017-11-25 DIAGNOSIS — Z9842 Cataract extraction status, left eye: Secondary | ICD-10-CM | POA: Diagnosis not present

## 2017-11-25 DIAGNOSIS — M6250 Muscle wasting and atrophy, not elsewhere classified, unspecified site: Secondary | ICD-10-CM

## 2017-11-25 DIAGNOSIS — R846 Abnormal cytological findings in specimens from respiratory organs and thorax: Secondary | ICD-10-CM | POA: Diagnosis not present

## 2017-11-25 DIAGNOSIS — I878 Other specified disorders of veins: Secondary | ICD-10-CM | POA: Diagnosis present

## 2017-11-25 DIAGNOSIS — I4891 Unspecified atrial fibrillation: Secondary | ICD-10-CM | POA: Diagnosis present

## 2017-11-25 DIAGNOSIS — E871 Hypo-osmolality and hyponatremia: Secondary | ICD-10-CM | POA: Diagnosis present

## 2017-11-25 DIAGNOSIS — I10 Essential (primary) hypertension: Secondary | ICD-10-CM | POA: Diagnosis present

## 2017-11-25 DIAGNOSIS — I313 Pericardial effusion (noninflammatory): Secondary | ICD-10-CM | POA: Diagnosis present

## 2017-11-25 DIAGNOSIS — I444 Left anterior fascicular block: Secondary | ICD-10-CM | POA: Diagnosis present

## 2017-11-25 DIAGNOSIS — J181 Lobar pneumonia, unspecified organism: Secondary | ICD-10-CM | POA: Diagnosis present

## 2017-11-25 DIAGNOSIS — C3411 Malignant neoplasm of upper lobe, right bronchus or lung: Secondary | ICD-10-CM | POA: Diagnosis present

## 2017-11-25 DIAGNOSIS — Z85118 Personal history of other malignant neoplasm of bronchus and lung: Secondary | ICD-10-CM | POA: Diagnosis not present

## 2017-11-25 DIAGNOSIS — J440 Chronic obstructive pulmonary disease with acute lower respiratory infection: Secondary | ICD-10-CM | POA: Diagnosis present

## 2017-11-25 DIAGNOSIS — L8990 Pressure ulcer of unspecified site, unspecified stage: Secondary | ICD-10-CM | POA: Diagnosis present

## 2017-11-25 HISTORY — PX: IR THORACENTESIS ASP PLEURAL SPACE W/IMG GUIDE: IMG5380

## 2017-11-25 LAB — COMPREHENSIVE METABOLIC PANEL
ALBUMIN: 2.5 g/dL — AB (ref 3.5–5.0)
ALK PHOS: 83 U/L (ref 38–126)
ALT: 9 U/L — ABNORMAL LOW (ref 14–54)
ANION GAP: 10 (ref 5–15)
AST: 20 U/L (ref 15–41)
BUN: 16 mg/dL (ref 6–20)
CALCIUM: 8.1 mg/dL — AB (ref 8.9–10.3)
CHLORIDE: 101 mmol/L (ref 101–111)
CO2: 22 mmol/L (ref 22–32)
Creatinine, Ser: 0.81 mg/dL (ref 0.44–1.00)
GFR calc non Af Amer: >60 mL/min (ref >60)
GLUCOSE: 85 mg/dL (ref 65–99)
Potassium: 4 mmol/L (ref 3.5–5.1)
SODIUM: 133 mmol/L — AB (ref 135–145)
Total Bilirubin: 0.8 mg/dL (ref 0.3–1.2)
Total Protein: 5.7 g/dL — ABNORMAL LOW (ref 6.5–8.1)

## 2017-11-25 LAB — CBC
HEMATOCRIT: 30.4 % — AB (ref 36.0–46.0)
HEMOGLOBIN: 9.8 g/dL — AB (ref 12.0–15.0)
MCH: 29.4 pg (ref 26.0–34.0)
MCHC: 32.2 g/dL (ref 30.0–36.0)
MCV: 91.3 fL (ref 78.0–100.0)
Platelets: 311 10*3/uL (ref 150–400)
RBC: 3.33 MIL/uL — AB (ref 3.87–5.11)
RDW: 13.4 % (ref 11.5–15.5)
WBC: 13.4 10*3/uL — ABNORMAL HIGH (ref 4.0–10.5)

## 2017-11-25 LAB — PROTEIN, PLEURAL OR PERITONEAL FLUID: Total protein, fluid: 3 g/dL

## 2017-11-25 LAB — GLUCOSE, PLEURAL OR PERITONEAL FLUID: Glucose, Fluid: 116 mg/dL

## 2017-11-25 LAB — ECHOCARDIOGRAM COMPLETE
Height: 66 in
Weight: 2024.7 oz

## 2017-11-25 LAB — LACTATE DEHYDROGENASE: LDH: 488 U/L — ABNORMAL HIGH (ref 98–192)

## 2017-11-25 MED ORDER — GADOBENATE DIMEGLUMINE 529 MG/ML IV SOLN
12.0000 mL | Freq: Once | INTRAVENOUS | Status: AC | PRN
  Administered 2017-11-25: 12 mL via INTRAVENOUS

## 2017-11-25 MED ORDER — LIDOCAINE HCL (PF) 1 % IJ SOLN
INTRAMUSCULAR | Status: DC | PRN
  Administered 2017-11-25: 10 mL

## 2017-11-25 MED ORDER — LIDOCAINE HCL (PF) 2 % IJ SOLN
INTRAMUSCULAR | Status: AC
  Filled 2017-11-25: qty 10

## 2017-11-25 MED ORDER — LEVALBUTEROL HCL 0.63 MG/3ML IN NEBU: 0.6300 mg | INHALATION_SOLUTION | Freq: Four times a day (QID) | RESPIRATORY_TRACT | Status: DC | PRN

## 2017-11-25 MED ORDER — COLLAGENASE 250 UNIT/GM EX OINT
TOPICAL_OINTMENT | Freq: Every day | CUTANEOUS | Status: DC
  Administered 2017-11-25 – 2017-11-26 (×2): via TOPICAL
  Filled 2017-11-25: qty 30

## 2017-11-25 MED ORDER — IOPAMIDOL (ISOVUE-370) INJECTION 76%
INTRAVENOUS | Status: AC
  Administered 2017-11-25: 100 mL
  Filled 2017-11-25: qty 100

## 2017-11-25 MED ORDER — ORAL CARE MOUTH RINSE
15.0000 mL | Freq: Two times a day (BID) | OROMUCOSAL | Status: DC
  Administered 2017-11-25 – 2017-11-26 (×4): 15 mL via OROMUCOSAL

## 2017-11-25 NOTE — Progress Notes (Signed)
Subjective: The patient was seen and examined this morning during rounds. She is not short of breath upon entering the room. Patient still on supplemental oxygen. Patient is still actively coughing. She was given a breathing treatment overnight due to a coughing spell. The cough is non-productive. She had no other issues overnight and no new issues.  Objective: Vital signs in last 24 hours: Vitals:   11/25/17 0500 11/25/17 0900 11/25/17 1134 11/25/17 1152  BP:  (!) 115/55  119/73  Pulse:      Resp:  (!) 23    Temp:  (!) 97.5 F (36.4 C)    TempSrc:  Oral Oral   SpO2:  96%    Weight: 57.4 kg (126 lb 8.7 oz)     Height:       Weight change:   Intake/Output Summary (Last 24 hours) at 11/25/2017 1335 Last data filed at 11/25/2017 0900 Gross per 24 hour  Intake 690 ml  Output -  Net 690 ml   General: Ill appearing, sitting up in bed comfortably, NAD HEENT: Temporal wasting, no scleral icterus Cardiac: irregular rhythm, normal rate, No murmur, rubs, or gallops Pulm: normal effort, decreased breath sounds diffusely throughout all lung fields  Abd: soft, non tender, non distended, BS normal Ext: extremities well perfused, no peripheral edema Neuro: alert and oriented X3, cranial nerves II-XII grossly intac  Lab Results: @LABTEST2 @ Micro Results: No results found for this or any previous visit (from the past 240 hour(s)). Studies/Results: Dg Chest 1 View  Result Date: 11/25/2017 CLINICAL DATA:  Status post thoracentesis. EXAM: CHEST  1 VIEW COMPARISON:  Chest x-ray 11/26/1998 19. FINDINGS: Mediastinum and right hilar prominence again noted consistent adenopathy. Right apical mass again noted. COPD. Stable left pleural effusion. Small right pleural effusion. No evidence of pneumothorax post thoracentesis. Stable cardiomegaly. IMPRESSION: No evidence of pneumothorax post thoracentesis Electronically Signed   By: Brenda Gardner   On: 11/25/2017 12:33   X-ray Chest Pa And  Lateral  Result Date: 11/25/2017 CLINICAL DATA:  Hypoxia EXAM: CHEST - 2 VIEW COMPARISON:  11/24/2017.  CT 11/04/2017. FINDINGS: Mediastinal and right hilar prominence again noted consistent adenopathy. Right upper lung mass again noted. Cardiomegaly with mild bilateral interstitial prominence and bilateral pleural effusions. No pneumothorax. No acute bony abnormality. IMPRESSION: 1. Mediastinal right hilar prominence again noted consistent adenopathy. Right upper lung mass again noted without interim change. 2. Cardiomegaly with mild bilateral interstitial prominence and bilateral pleural effusions suggesting mild CHF. Electronically Signed   By: Brenda Moores  Gardner   On: 11/25/2017 09:15   Dg Chest 2 View  Result Date: 11/24/2017 CLINICAL DATA:  Shortness of breath and cough EXAM: CHEST - 2 VIEW COMPARISON:  10/30/2017 plain film, 11/04/2017 CT of the chest FINDINGS: Known right upper lobe mass lesion is again identified. Right hilar adenopathy is noted. Right pleural effusion is noted which appears larger than that seen on recent CT. New small left pleural effusion is noted. The remainder of the left lung is clear. Cardiac shadow is within normal limits. IMPRESSION: Known right upper lobe lung mass with associated effusion. New left pleural effusion is noted. Electronically Signed   By: Brenda Catalina M.D.   On: 11/24/2017 13:17   Medications: I have reviewed the patient's current medications. Scheduled Meds: . collagenase   Topical Daily  . enoxaparin (LOVENOX) injection  40 mg Subcutaneous Q24H  . feeding supplement (ENSURE ENLIVE)  237 mL Oral BID BM  . iopamidol      . lidocaine      .  mouth rinse  15 mL Mouth Rinse BID   Continuous Infusions: . piperacillin-tazobactam (ZOSYN)  IV Stopped (11/25/17 1030)   PRN Meds:.acetaminophen **OR** acetaminophen, benzonatate, levalbuterol, lidocaine (PF), ondansetron **OR** ondansetron (ZOFRAN) IV, senna-docusate, traZODone Assessment/Plan: [name] a84 y.o.femalewith past medical history significant for PET + lung nodule and tongue soft tissue mass s/p resection and radiation, tobacco use and HTNwho presents today to the ED due to shortness of breath for the past 1.5 weeks that has acutely worsened and a chronic cough.  Shortness of breath/Chronic non-productive cough  Patient was recently seen by CT surgery on 3/15 due to a right apical mass seen on CXR. CT scan revealed a large right apical mass with postobstructive pneumonitis, bulky hilar and mediastinal adenopathy, and a pleural effusion. Per patient's CT surgeon, presentation most consistent with a bronchogenic carcinoma. Clinical stage IV (T3, N2, M1). The patient's acute worsening shortness of breath began 1.5 weeks ago. The shortness of breath is continuous, worsens with exertion, and does not resolve with rest. This is likely due to extrinsic compression on the right mainstem bronchus, bronchus intermedius and right upper and right middle lobe bronchi by the right apical mass. Pulmonary embolism is also a possibility given acute-worsening of her shortness of breath in the context of a known malignancy which increases hypercoagulability. The patient also has bilateral pleural effusions on imaging which have enlarged in size in comparison to imaging done a few weeks ago. It is possible the worsening pleural effusions are responsible for her worsening respiratory symptoms.   -Patient will have a CTA done to evaluate for PE and reavalaute the newly discovered mass and postobstructive pneumonitis -MRI of brain to rule out metastases of the bronchogenic carcinoma. -Thoracentesis of right pleural effusion for therapeutic measures and analysis of pleural fluid -Discontinue duonebs due to recurrent atrial fibrillation. Start Xopenex instead PRN for shortness of breath and wheezing -Continue Zosyn to treat suspected post-obstructive pneumonia  -Referral tomultidisciplinary thoracic  oncology clinic -Cough suppression: Tessalon pearls PRN -PT/OT  Atrial fibrillation EKG was done on arrival to the ED and showed new onset atrial fibrillation. Patient was subsequently started on IV diltiazem. It was discontinued after she went back into sinus rhythm. During rounds this morning, patient went back into atrial fibrillation but he rate is controlled. She has no history of atrial fibrillation or arrhythmias prior to this admission.  -PRN IV diltiazem drip if patient develops rapid ventricular response -Echo pending to evaluate for paroxysmal atrial fibrillation -Patient may possible be started on long-term anticoagulation therapy depending on results of the metastatic work-up. -Continue cardiac monitoring and 12 lead EKG   This is a Careers information officer Note.  The care of the patient was discussed with Dr. Aggie Hacker and the assessment and plan formulated with their assistance.  Please see their attached note for official documentation of the daily encounter.   LOS: 0 days   Jenell Milliner, Medical Student 11/25/2017, 1:35 PM

## 2017-11-25 NOTE — Evaluation (Signed)
Occupational Therapy Evaluation Patient Details Name: Brenda Gardner MRN: [id number] DOB: [birth year] Today's Date: 11/25/2017    History of Present Illness 84 yo female with PMH significant for tobacco use, cancer of the tongue, stage IA lung cancer left upper lobe treated with stereotactic radiation, and hypertension presenting with the chief  complaint of shortness of breath.   Clinical Impression   This 84 y/o F presents with the above. Pt lives with her daughter, at baseline pt reports she is independent with ADLs and functional mobility, though daughter reports concerns of pt needing assist for bathing ADLs. Pt presenting this session with generalized weakness and decreased activity tolerance, completing short distance mobility in room using RW with MinA, requiring intermittent standing rest breaks with brief room level activity. Pt currently requiring min-modA for LB ADLs. Pt will benefit from continued acute OT services to maximize her overall safety and independence with ADLs and mobility prior to return home.      Follow Up Recommendations  No OT follow up;Supervision/Assistance - 24 hour    Equipment Recommendations  None recommended by OT(pt's DME needs are met )           Precautions / Restrictions Precautions Precautions: Fall Precaution Comments: SOB with activity, watch SpO2 Restrictions Weight Bearing Restrictions: No      Mobility Bed Mobility Overal bed mobility: Needs Assistance Bed Mobility: Supine to Sit     Supine to sit: Supervision     General bed mobility comments: sitting EOB upon entering room   Transfers Overall transfer level: Needs assistance Equipment used: Rolling walker (2 wheeled) Transfers: Sit to/from Stand Sit to Stand: Mod assist         General transfer comment: ModA to power up and steady at RW; pt demonstrates safe hand placement (verbalizing recall from recent PT session)     Balance Overall balance assessment: Needs  assistance Sitting-balance support: Feet supported;No upper extremity supported Sitting balance-Leahy Scale: Good     Standing balance support: Single extremity supported Standing balance-Leahy Scale: Fair Standing balance comment: pt able to attempt to perform pericare with R UE while holding onto walker with L UE.  pt tolerated standing x 5 min for hygiene due to stool incontinence                           ADL either performed or assessed with clinical judgement   ADL Overall ADL's : Needs assistance/impaired Eating/Feeding: Modified independent;Sitting   Grooming: Set up;Sitting   Upper Body Bathing: Min guard;Sitting   Lower Body Bathing: Minimal assistance;Sit to/from stand   Upper Body Dressing : Min guard;Sitting   Lower Body Dressing: Sit to/from stand;Moderate assistance Lower Body Dressing Details (indicate cue type and reason): pt able to reach towards feet without difficulty; modA for sit<>stand from EOB  Toilet Transfer: Minimal assistance;Ambulation;Regular Glass blower/designer Details (indicate cue type and reason): simulated in transfer to/from EOB Toileting- Clothing Manipulation and Hygiene: Minimal assistance;Sit to/from Nurse, children's Details (indicate cue type and reason): discussed with pt/pt daughter use of 3:1 as shower seat during task completion Functional mobility during ADLs: Minimal assistance;Rolling walker General ADL Comments: pt on 1L O2 during room level functional mobility at RW, pt taking short standing rest breaks with short distance activity; difficult to obtain accurate SpO2 reading, after return to sitting EOB SpO2 intermittently reading in high 60's, though monitor with shaky waveform and pt able to converse with therapist,  within approx 30 seconds and repositioning of O2 sensor SpO2 quickly rebounding to high 90's-100%, remaining at 95-100% while seated end of session; educated pt on energy conservation during ADL  completion and deep breathing techniques                          Pertinent Vitals/Pain Pain Assessment: No/denies pain     Hand Dominance Right   Extremity/Trunk Assessment Upper Extremity Assessment Upper Extremity Assessment: Generalized weakness   Lower Extremity Assessment Lower Extremity Assessment: Generalized weakness   Cervical / Trunk Assessment Cervical / Trunk Assessment: Kyphotic   Communication Communication Communication: No difficulties   Cognition Arousal/Alertness: Awake/alert Behavior During Therapy: WFL for tasks assessed/performed Overall Cognitive Status: Within Functional Limits for tasks assessed                                                     Home Living Family/patient expects to be discharged to:: Private residence Living Arrangements: Children(daughter) Available Help at Discharge: Family;Available 24 hours/day Type of Home: House Home Access: Stairs to enter [employer] of Steps: 3 Entrance Stairs-Rails: Can reach both Home Layout: One level     Bathroom Shower/Tub: Occupational psychologist: Standard     Home Equipment: Environmental consultant - 4 wheels;Bedside commode;Shower seat          Prior Functioning/Environment Level of Independence: Independent with assistive device(s)        Comments: has been using rollator, was dressing and bathing indep up until the last week        OT Problem List: Decreased strength;Decreased activity tolerance;Decreased knowledge of use of DME or AE;Impaired balance (sitting and/or standing)      OT Treatment/Interventions: Self-care/ADL training;DME and/or AE instruction;Therapeutic activities;Balance training;Energy conservation;Patient/family education    OT Goals(Current goals can be found in the care plan section) Acute Rehab OT Goals Patient Stated Goal: get stronger OT Goal Formulation: With patient Time For Goal Achievement: 12/09/17 Potential  to Achieve Goals: Good  OT Frequency: Min 2X/week                             AM-PAC PT "6 Clicks" Daily Activity     Outcome Measure Help from another person eating meals?: None Help from another person taking care of personal grooming?: A Little Help from another person toileting, which includes using toliet, bedpan, or urinal?: A Little Help from another person bathing (including washing, rinsing, drying)?: A Little Help from another person to put on and taking off regular upper body clothing?: A Little Help from another person to put on and taking off regular lower body clothing?: A Little 6 Click Score: 19   End of Session Equipment Utilized During Treatment: Gait belt;Rolling walker;Oxygen Nurse Communication: Mobility status  Activity Tolerance: Patient tolerated treatment well Patient left: Other (comment);with family/visitor present;with bed alarm set(sitting EOB )  OT Visit Diagnosis: Muscle weakness (generalized) (M62.81)                Time: 1410-1431 OT Time Calculation (min): 21 min Charges:  OT General Charges $OT Visit: 1 Visit OT Evaluation $OT Eval Moderate Complexity: 1 Mod G-Codes:     Lou Cal, OT Pager [phone] 11/25/2017   Raymondo Band 11/25/2017, 4:29 PM

## 2017-11-25 NOTE — Progress Notes (Signed)
   Subjective: Patient seen and examined. States she had a coughing fit last night and required a breathing treatment, which improved her symptoms. Denies fevers or chills. SOB unchanged from admission.  Objective:  Vital signs in last 24 hours: Vitals:   11/24/17 1830 11/24/17 1900 11/24/17 2039 11/24/17 2332  BP: (!) 135/59 128/70 (!) 142/64 134/83  Pulse: 82 82 87 88  Resp: (!) 25  (!) 26   Temp:   98.3 F (36.8 C) 99 F (37.2 C)  TempSrc:   Oral Oral  SpO2: 95% 97% 98% 99%  Weight:   110 lb 3.7 oz (50 kg)   Height:   5\' 6"  (1.676 m)    General: Chronically ill appearing, sitting up in bed comfortably, NAD HEENT: Temporal wasting, no scleral icterus Cardiac: irregular rhythm, rate normal, No R/M/G appreciated Pulm: normal effort, decreased breath sounds diffusely throughout all lung fields  Abd: soft, non tender, non distended, BS normal Ext: extremities well perfused, no peripheral edema Neuro: alert and oriented X3, cranial nerves II-XII grossly intact   Assessment/Plan:  Active Problems:   Mass of upper lobe of right lung   Right Upper Lobe Mass Dyspnea on Exertion CT scan of the chest performed on 3/6 showed right upper lobe mass with postobstructive pneumonitis in the right upper lobe and bulky right hilar/ipsilateral mediastinal adenopathy, most consistent with primary bronchogenic carcinoma. I believe this is contributing to her shortness of breath as the mass is extrinsically compressing on the right mainstem bronchus, bronchus intermedius and right upper and right middle lobe bronchi. She is also a long term cigarette smoker with likely COPD given emphysematous changes seen on imaging. The large mass and lack of lung reserve, and possible postobstructive pneumonia are likely the cause of the patient's hypoxia and shortness of breath. She also has a interval enlargement of her right pleural effusion and a new left small pleural effusion, which could be contributing.  Given risk factors will also rule out PE.  -Continue Zosyn, concern for post obstructive pneumonia  -Will get CTA chest to rule out PE  -IR to evaluate for thoracentesis of right pleural effusion - appreciate their consultation  -MRI of brain to evaluate for mets - patient scheduled for work up as outpatient -Cough suppression: Tessalon pearls PRN -D/c Duonebs and start Xopenex PRN for SOB/wheezing - in setting of atrial fibrillation -Continuous pulse ox; maintain O2 sats > 90% with supplemental O2 -PT/OT   Atrial fibrillation with RVR New onset, presented with RVR. No history of atrial fibrillation. Converted to sinus rhythm yesterday after receiving IV dilt. CHA2DS2VASC score of 3. Converted back to atrial fibrillation this morning, but currently rate controlled.  -Cardiac monitoring -ECHO pending -12 lead EKG  -If develops RVR will give PRN IV diltiazem -Will discuss options for anticoagulation - would like to get MRI to rule out mets prior to considering anticoagulation    Dispo: Anticipated discharge in approximately 1-2 day(s).   Melanee Spry, MD 11/25/2017, 6:28 AM Pager: [phone]

## 2017-11-25 NOTE — Procedures (Signed)
PROCEDURE SUMMARY:  Successful US guided diagnostic and therapeutic right thoracentesis. Yielded 900 mL of amber fluid. Pt tolerated procedure well. No immediate complications.  Specimen was sent for labs. CXR ordered as patient tells me she still does not think she will be able to lie flat for CT.   Docia Barrier PA-C 11/25/2017 12:17 PM

## 2017-11-25 NOTE — Evaluation (Signed)
Physical Therapy Evaluation Patient Details Name: Brenda Gardner MRN: [id number] DOB: 1932-10-18 Today's Date: 11/25/2017   History of Present Illness  84 yo female with PMH significant for tobacco use, cancer of the tongue, stage IA lung cancer left upper lobe treated with stereotactic radiation, and hypertension presenting with the chief  complaint of shortness of breath.  Clinical Impression  Pt admitted with above. Pt with generalized deconditioning presenting with decreased activity tolerance and endurance with onset of SOB with activity. Pt with good home set up and support.   SATURATION QUALIFICATIONS: (This note is used to comply with regulatory documentation for home oxygen)  Patient Saturations on Room Air at Rest = n/a  Patient Saturations on Room Air while Ambulating =n/a  Patient Saturations on 1 Liters of oxygen while Ambulating = 88%  Please briefly explain why patient needs home oxygen:unable to maintain >88% on RA  Pt to currently require 24/7 assist for safe d/c home. Pt to benefit from HHPT to progress towards indep with mobility as pt was PTA.    Follow Up Recommendations Home health PT;Supervision/Assistance - 24 hour    Equipment Recommendations  None recommended by PT    Recommendations for Other Services       Precautions / Restrictions Precautions Precautions: Fall Precaution Comments: SOB with activity, watch SpO2 Restrictions Weight Bearing Restrictions: No      Mobility  Bed Mobility Overal bed mobility: Needs Assistance Bed Mobility: Supine to Sit     Supine to sit: Supervision     General bed mobility comments: hob elevated and use of bed rail  Transfers Overall transfer level: Needs assistance Equipment used: Rolling walker (2 wheeled) Transfers: Sit to/from Stand Sit to Stand: Min assist;Mod assist         General transfer comment: min/modA to power up and steady during patients transition of hands from bed to walker, v/c's  for safe hand placement  Ambulation/Gait Ambulation/Gait assistance: Min assist Ambulation Distance (Feet): 30 Feet Assistive device: Rolling walker (2 wheeled) Gait Pattern/deviations: Step-through pattern;Decreased stride length;Trunk flexed;Narrow base of support Gait velocity: slow Gait velocity interpretation: Below normal speed for age/gender General Gait Details: +SOB, SpO2 at 88% on 1LO2 via [place]. Pt with freq standing rest breaks due to fatigue, "i'm winded"  Stairs            Wheelchair Mobility    Modified Rankin (Stroke Patients Only)       Balance Overall balance assessment: Needs assistance Sitting-balance support: Feet supported;No upper extremity supported Sitting balance-Leahy Scale: Good     Standing balance support: Single extremity supported Standing balance-Leahy Scale: Fair Standing balance comment: pt able to attempt to perform pericare with R UE while holding onto walker with L UE.  pt tolerated standing x 5 min for hygiene due to stool incontinence                             Pertinent Vitals/Pain Pain Assessment: No/denies pain    Home Living Family/patient expects to be discharged to:: Private residence Living Arrangements: Children(daughter) Available Help at Discharge: Family;Available 24 hours/day Type of Home: House Home Access: Stairs to enter Entrance Stairs-Rails: Can reach both Entrance Stairs-Number of Steps: 3 Home Layout: One level Home Equipment: Walker - 4 wheels      Prior Function Level of Independence: Independent with assistive device(s)         Comments: has been using rollator, was dressing and bathing indep  up until the last week     Hand Dominance   Dominant Hand: Right    Extremity/Trunk Assessment   Upper Extremity Assessment Upper Extremity Assessment: Generalized weakness    Lower Extremity Assessment Lower Extremity Assessment: Generalized weakness    Cervical / Trunk  Assessment Cervical / Trunk Assessment: Kyphotic  Communication   Communication: No difficulties  Cognition Arousal/Alertness: Awake/alert Behavior During Therapy: WFL for tasks assessed/performed Overall Cognitive Status: Within Functional Limits for tasks assessed                                        General Comments General comments (skin integrity, edema, etc.): Pt's purwick completed saturated in stool.and noted skin break down at top of crack. RN notified and assessed    Exercises     Assessment/Plan    PT Assessment Patient needs continued PT services  PT Problem List Decreased strength;Decreased range of motion;Decreased activity tolerance;Decreased balance;Decreased mobility;Decreased cognition;Decreased knowledge of use of DME;Decreased safety awareness       PT Treatment Interventions DME instruction;Gait training;Stair training;Functional mobility training;Therapeutic activities;Therapeutic exercise;Balance training;Neuromuscular re-education    PT Goals (Current goals can be found in the Care Plan section)  Acute Rehab PT Goals Patient Stated Goal: get stronger PT Goal Formulation: With patient/family Time For Goal Achievement: 12/02/17 Potential to Achieve Goals: Good    Frequency Min 3X/week   Barriers to discharge        Co-evaluation               AM-PAC PT "6 Clicks" Daily Activity  Outcome Measure Difficulty turning over in bed (including adjusting bedclothes, sheets and blankets)?: A Little Difficulty moving from lying on back to sitting on the side of the bed? : A Little Difficulty sitting down on and standing up from a chair with arms (e.g., wheelchair, bedside commode, etc,.)?: Unable Help needed moving to and from a bed to chair (including a wheelchair)?: A Little Help needed walking in hospital room?: A Little Help needed climbing 3-5 steps with a railing? : A Little 6 Click Score: 16    End of Session Equipment  Utilized During Treatment: Oxygen(1LO2 via [place]) Activity Tolerance: Patient limited by fatigue Patient left: in bed;with call bell/phone within reach;with bed alarm set;with family/visitor present(sitting EOB to eat lunch) Nurse Communication: Mobility status PT Visit Diagnosis: Unsteadiness on feet (R26.81)    Time: 6203-5597 PT Time Calculation (min) (ACUTE ONLY): 34 min   Charges:   PT Evaluation $PT Eval Moderate Complexity: 1 Mod PT Treatments $Therapeutic Activity: 8-22 mins   PT G Codes:        Kittie Plater, PT, DPT Pager #: [phone] Office #: [phone]   Brenda Gardner 11/25/2017, 2:22 PM

## 2017-11-25 NOTE — Progress Notes (Signed)
  Date: 11/25/2017  Patient name: Brenda Gardner  Medical record number: [id number]  Date of birth: 1933/04/17   I have seen and evaluated this patient and I have discussed the plan of care with the house staff. Please see their note for complete details. I concur with their findings with the following additions/corrections:   84 year old woman with recently diagnosed right apical lung mass presenting with progressive dyspnea. She has a prior history of tongue cancer and hemochromatosis, as well as prior left upper lobe stage IA non-small cell carcinoma treated with stereotactic radiation, and she continues to smoke.   Her current lung mass was diagnosed when she had a CXR for persistant dry cough. Follow-up CT showed a 5 x 7 cm right upper lobe mass with postobstructive pneumonitis and bulky hilar and mediastinal lymphadenopathy with extrinsic compression on the right mainstem bronchus She saw cardiacthoracic surgery earlier this month, and was scheduled for MRI brain today and PET scan later this week, with a plan for CT-guided biopsy on 12/01/2017. She is scheduled to meet with Dr. Earlie Server with oncology on 12/04/2017 and Dr. Sondra Come with radiation oncology on 12/08/2017.  Unfortunately, since seeing CT surgery, she has decompensated significantly with increasing respiratory distress with even minimal exertion. She has an ongoing dry cough. Home pulse oximetry showed desaturation to the 70s with exertion or coughing. Her daughter was so concerned she called EMS.  On exam, she is cachectic with temporal wasting and appears extremely frail with nasal cannula in place. She is not in any respiratory distress at rest, but has mild retractions with moving around in bed. Her heartbeat is irregularly irregular without any murmurs. Her lung sounds are distant, and deep breaths induce coughing.  Workup is significant for: WBC 14.2 Sodium 131 Albumin 2.9 BNP 419 Troponin negative TSH 3.5  CXR with  persistent right apical lung mass and mediastinal and hilar prominence, also with cardiomegaly and mild interstitial edema with bilateral pleural effusions  EKG with atrial fibrillation but no ischemic changes  Assessment and plan: Progressive dyspnea most likely related to large right apical lung mass with hilar and mediastinal lymphadenopathy that is compressing the right mainstem bronchus, although it appears patent on prior CT. She also had evidence of postobstructive pneumonitis on her prior CT, raising concern for postobstructive pneumonia with leukocytosis, even in the absence of fever. Pulmonary embolism is also a consideration, and we will obtain a CT PE today to evaluate. There is also possible her pleural effusions are contributing to her dyspnea, and we have consulted interventional radiology for thoracentesis today. -Zosyn for possible postobstructive pneumonia -CT PE to evaluate for pulmonary embolism -Appreciate IR assistance with thoracentesis, which hopefully will be therapeutic as well as possibly diagnostic for her malignancy -We'll try to obtain MRI brain that was scheduled for today -Breathing treatments as needed for presumed COPD  Atrial fibrillation: New diagnosis, will obtain echocardiogram to evaluate for structural etiologies, ensure rate control. Expect may improve with resolution of hypoxia and improved respiratory distress  Lenice Pressman, M.D., Ph.D. 11/25/2017, 3:49 PM

## 2017-11-25 NOTE — Consult Note (Signed)
[place] Nurse wound consult note Reason for Consult: Pressure Injury to left lateral ankle Wound type: PI, unstageable Pressure Injury POA: Yes Measurement: 1 cm x 0.7 cm x unknown depth Wound bed: 100% yellow slough Drainage (amount, consistency, odor) small, yellow; no odor Periwound: erythematous, dry, scaling skin Dressing procedure/placement/frequency: Santyl to wound bed, cover with saline moistened gauze, then dry gauze.  Change daily.  Prevalon boots when patient is in bed. Thank you for the consult.  Discussed plan of care with the patient and bedside nurse.  [place] nurse will not follow at this time.  Please re-consult the [place] team if needed.  Val Riles, RN, MSN, CWOCN, CNS-BC, pager [phone]

## 2017-11-25 NOTE — Progress Notes (Signed)
  Echocardiogram 2D Echocardiogram has been performed.  Johny Chess 11/25/2017, 2:13 PM

## 2017-11-26 ENCOUNTER — Encounter (HOSPITAL_COMMUNITY): Payer: Medicare Other

## 2017-11-26 DIAGNOSIS — J9601 Acute respiratory failure with hypoxia: Secondary | ICD-10-CM

## 2017-11-26 DIAGNOSIS — J188 Other pneumonia, unspecified organism: Secondary | ICD-10-CM

## 2017-11-26 DIAGNOSIS — Z888 Allergy status to other drugs, medicaments and biological substances status: Secondary | ICD-10-CM

## 2017-11-26 LAB — GRAM STAIN

## 2017-11-26 LAB — CBC
HCT: 29.3 % — ABNORMAL LOW (ref 36.0–46.0)
Hemoglobin: 9.7 g/dL — ABNORMAL LOW (ref 12.0–15.0)
MCH: 30.5 pg (ref 26.0–34.0)
MCHC: 33.1 g/dL (ref 30.0–36.0)
MCV: 92.1 fL (ref 78.0–100.0)
Platelets: 262 10*3/uL (ref 150–400)
RBC: 3.18 MIL/uL — ABNORMAL LOW (ref 3.87–5.11)
RDW: 13.4 % (ref 11.5–15.5)
WBC: 10.5 10*3/uL (ref 4.0–10.5)

## 2017-11-26 LAB — COMPREHENSIVE METABOLIC PANEL
ALT: 9 U/L — ABNORMAL LOW (ref 14–54)
AST: 20 U/L (ref 15–41)
Albumin: 2.3 g/dL — ABNORMAL LOW (ref 3.5–5.0)
Alkaline Phosphatase: 69 U/L (ref 38–126)
Anion gap: 9 (ref 5–15)
BUN: 15 mg/dL (ref 6–20)
CO2: 21 mmol/L — ABNORMAL LOW (ref 22–32)
Calcium: 8.2 mg/dL — ABNORMAL LOW (ref 8.9–10.3)
Chloride: 101 mmol/L (ref 101–111)
Creatinine, Ser: 0.83 mg/dL (ref 0.44–1.00)
GFR calc Af Amer: >60 mL/min (ref >60)
GFR calc non Af Amer: >60 mL/min (ref >60)
Glucose, Bld: 109 mg/dL — ABNORMAL HIGH (ref 65–99)
Potassium: 3.6 mmol/L (ref 3.5–5.1)
Sodium: 131 mmol/L — ABNORMAL LOW (ref 135–145)
Total Bilirubin: 0.8 mg/dL (ref 0.3–1.2)
Total Protein: 5.2 g/dL — ABNORMAL LOW (ref 6.5–8.1)

## 2017-11-26 LAB — AMYLASE, PLEURAL OR PERITONEAL FLUID: Amylase, Fluid: 41 U/L

## 2017-11-26 LAB — LACTATE DEHYDROGENASE, PLEURAL OR PERITONEAL FLUID: LD, Fluid: 427 U/L — ABNORMAL HIGH (ref 3–23)

## 2017-11-26 LAB — BODY FLUID CELL COUNT WITH DIFFERENTIAL
Eos, Fluid: 0 %
Lymphs, Fluid: 15 %
Monocyte-Macrophage-Serous Fluid: 40 % — ABNORMAL LOW (ref 50–90)
Neutrophil Count, Fluid: 45 % — ABNORMAL HIGH (ref 0–25)
Total Nucleated Cell Count, Fluid: 355 cu mm (ref 0–1000)

## 2017-11-26 MED ORDER — COLLAGENASE 250 UNIT/GM EX OINT: TOPICAL_OINTMENT | Freq: Every day | CUTANEOUS | 0 refills | Status: AC

## 2017-11-26 MED ORDER — SODIUM CHLORIDE 0.9 % IV SOLN
INTRAVENOUS | Status: AC
  Administered 2017-11-26: 07:00:00 via INTRAVENOUS

## 2017-11-26 MED ORDER — AMOXICILLIN-POT CLAVULANATE 875-125 MG PO TABS: 1.0000 | ORAL_TABLET | Freq: Two times a day (BID) | ORAL | 0 refills | Status: AC

## 2017-11-26 MED ORDER — BENZONATATE 200 MG PO CAPS: 200.0000 mg | ORAL_CAPSULE | Freq: Three times a day (TID) | ORAL | 0 refills | Status: AC | PRN

## 2017-11-26 NOTE — Progress Notes (Addendum)
Pt didn't have any UOP over night, bladder scan showed 51 ml, IMTS on call MD notified, no new orders received.   Order for NS 100 ml/hr placed at 0645.

## 2017-11-26 NOTE — Progress Notes (Signed)
  Date: 11/26/2017  Patient name: Brenda Gardner  Medical record number: [id number]  Date of birth: August 06, 1933   I have seen and evaluated this patient and I have discussed the plan of care with the house staff. Please see their note for complete details. I concur with their findings with the following additions/corrections:   She reports some improvement in her breathing and cough after thoracentesis yesterday that removed approximately 900 mL of fluid. No organisms seen on Gram stain, culture pending, cytology of the pleural fluid shows reactive mesothelial cells but no malignant cells.   CT PE showed no pulmonary embolism, but did show findings of persistent 5 x 7 right upper lobe lung mass, worsening hilar and mediastinal lymphadenopathy with compression of the right bronchial tree and pulmonary arteries, postobstructive pneumonitis, bilateral pleural effusions that are decreased on the right after thoracentesis, and increased pericardial effusion.  MRI brain did not reveal any metastatic lesions.  Will discharge with home oxygen and continued outpatient workup and management of advanced stage lung cancer. We will continue amoxicillin/clavulanic acid for presumed postobstructive pneumonia and defer to Dr. Earlie Server for continuation or cessation of this therapy. Unfortunately, given the advanced stage of this cancer and her cachectic appearance, I'm afraid she has a grim prognosis.  Lenice Pressman, M.D., Ph.D. 11/26/2017, 2:34 PM

## 2017-11-26 NOTE — Care Management Note (Addendum)
Case Management Note  Patient Details  Name: Brenda Gardner MRN: [id number] Date of Birth: Apr 22, 1933  Subjective/Objective:     Pt admitted with mass of upper lobe of rt lung. She is from home with her daughter.               Action/Plan: Pt to d/c home with home oxygen. CM met with the patient and her daughter to provide choice and they would like to use [facility]. Butch Penny with [facility] made aware of need for home oxygen. CM following for [place] orders. Daughter would like to use [facility] for [facility] if able.   Addendum: oxygen cleared through insurance and patient will have a portable tank for the ride home. [facility] was able to accept her for [facility] PT. Daughter to provide transportation home.   Expected Discharge Date:                  Expected Discharge Plan:  [place]  In-House Referral:     Discharge planning Services  CM Consult  Post Acute Care Choice:  Home Health, Durable Medical Equipment Choice offered to:  Patient, Adult Children  DME Arranged:  Oxygen DME Agency:  [place]:    [place] Agency:     Status of Service:  In process, will continue to follow  If discussed at Long Length of Stay Meetings, dates discussed:    Additional Comments:  Pollie Friar, RN 11/26/2017, 10:43 AM

## 2017-11-26 NOTE — Progress Notes (Signed)
Subjective: Patient seen and examined. States her shortness of breath and cough have improved after thoracentesis yesterday. Denies palpitations of sensation of her heart racing.   Discussed treatment plan with the patient including follow up with oncology, home oxygen and continuing antibiotics. She is agreeable with these plans and is amenable to discharge today.   Objective:  Vital signs in last 24 hours: Vitals:   11/25/17 1152 11/25/17 1734 11/26/17 0410 11/26/17 0500  BP: 119/73 (!) 153/79 115/60   Pulse:   85   Resp:  (!) 23 20   Temp:  98.9 F (37.2 C) 98.7 F (37.1 C)   TempSrc:  Oral Oral   SpO2:  91% 97%   Weight:    113 lb 5.1 oz (51.4 kg)  Height:       General: Chronically ill appearing, sitting up in bed comfortably, NAD HEENT: Temporal wasting, no scleral icterus Cardiac: irregular rhythm, rate normal, No R/M/G appreciated Pulm: normal effort, decreased breath sounds diffusely throughout all lung fields; frequent coughing with deep inspiration  Abd: soft, non tender, non distended, BS normal Ext: extremities well perfused, no peripheral edema Neuro: alert and oriented X3, cranial nerves II-XII grossly intact   Assessment/Plan:  Principal Problem:   Mass of upper lobe of right lung Active Problems:   Tobacco abuse   Protein-calorie malnutrition, severe (HCC)   Pressure injury of skin   Acute hypoxemic respiratory failure (HCC)   Atrial fibrillation (HCC)   Aortic atherosclerosis (HCC)   Right Upper Lobe Mass Dyspnea on Exertion Post obstructive pneumonia Repeat CT with stable 7 cm right upper lobe mass compatible with primary bronchogenic carcinoma. CTA negative for PE. Thoracentesis performed by IR drained 900 mL of fluid, exudative by Light's criteria, no organisms seen on gram stain, cytology pending. Day 2 of Zosyn, day 3 of IV antibiotics total. MRI of brain without evidence of metastasis. At this time the patient is stable for discharge and will  benefit from resuming outpatient oncology work up including PET scan.   -Will transition to PO antibiotics at discharge, likely Augmentin  -Cytology of pleural fluid pending  -MRI of brain to evaluate for mets  -Met criteria for home oxygen - consult to care management placed, appreciate their help  -Cough suppression: Tessalon pearls PRN -D/c Duonebs and start Xopenex PRN for SOB/wheezing - in setting of atrial fibrillation -Continuous pulse ox; maintain O2 sats > 90% with supplemental O2 -PT/OT - home health PT  Atrial fibrillation with RVR New onset, presented with RVR. No known history of atrial fibrillation. Continues to convert in and out of atrial fibrillation. Currently rate controlled without meds. ECHO with normal LV systolic function, mild diastolic dysfunction. Large mass seen on echo likely mediastinal lymphadenopathy seen on CT. Moderate pericardial effusion seen on Ct and ECHO, without hemodynamic instability. CHA2DS2VASC score of 3, 3.2% yearly risk of stroke. At this time I believe the risks of anticoagulation outweigh the benefits. She has follow up with Oncologist on 4/4, and will defer decision to oncology at follow up. Patient's heart rates have been relatively well controlled without medication. Her blood pressures have been borderline soft and I am not inclined to start her on a rate control medication at this time. -Discussed strict return precautions with patient and symptoms to her atrial fibrillation that would require medical evaluation including: acute worsening of SOB, palpitations, heart racing, or experiences CP.     Dispo: Anticipated discharge today.   Melanee Spry, MD 11/26/2017, 6:46 AM  Pager: [phone]

## 2017-11-26 NOTE — Progress Notes (Signed)
Patient O2 saturation was 88% at rest on room air and 86% on room air while ambulating.

## 2017-11-26 NOTE — Progress Notes (Signed)
Patient discharged home with daughter on home oxygen that was delivered to patient's hospital room. All discharge instructions discussed with patient and her daughter to their satisfaction. All personal belongings sent with patient.

## 2017-11-26 NOTE — Progress Notes (Signed)
Subjective: The patient was seen and examined this morning during rounds.  Patient still on 1 L of supplemental oxygen. Patient reports her shortness of breath has improved significantly since having the thoracentesis done yesterday. The patient still has a non-productive cough. She had no other issues overnight and no new issues.   Objective: Vital signs in last 24 hours: Vitals:   11/25/17 1734 11/26/17 0410 11/26/17 0500 11/26/17 1130  BP: (!) 153/79 115/60  (!) 105/56  Pulse:  85  99  Resp: (!) 23 20  20   Temp: 98.9 F (37.2 C) 98.7 F (37.1 C)  98.3 F (36.8 C)  TempSrc: Oral Oral  Oral  SpO2: 91% 97%  98%  Weight:   51.4 kg (113 lb 5.1 oz)   Height:       Weight change: -3.6 kg (-7 lb 15 oz)  Intake/Output Summary (Last 24 hours) at 11/26/2017 1426 Last data filed at 11/26/2017 1300 Gross per 24 hour  Intake 1130 ml  Output 350 ml  Net 780 ml   General: Ill appearing, sitting up in bed comfortably, NAD HEENT: Temporal wasting, no scleral icterus Cardiac: regular rhythm, normal rate, No murmur, rubs, or gallops Pulm: normal effort, decreased breath sounds diffusely throughout all lung fields  Abd: soft, non tender, non distended, BS normal Ext: extremities well perfused, no peripheral edema Neuro: alert and oriented X3, cranial nerves II-XII grossly intac  Lab Results: @LABTEST2 @ Micro Results: Recent Results (from the past 240 hour(s))  Gram stain     Status: None   Collection Time: 11/25/17 12:24 PM  Result Value Ref Range Status   Specimen Description PLEURAL  Final   Special Requests FLUID RIGHT  Final   Gram Stain   Final    WBC PRESENT,BOTH PMN AND MONONUCLEAR NO ORGANISMS SEEN CYTOSPIN SMEAR Performed at [facility], [address] [place], [place] [address]    Report Status 11/26/2017 FINAL  Final  Culture, body fluid-bottle     Status: None (Preliminary result)   Collection Time: 11/25/17 12:24 PM  Result Value Ref Range Status   Specimen  Description FLUID PLEURAL RIGHT  Final   Special Requests NONE  Final   Culture   Final    NO GROWTH < 24 HOURS Performed at [facility], [place] [address] [place], [place] [address]    Report Status PENDING  Incomplete   Studies/Results: Dg Chest 1 View  Result Date: 11/25/2017 CLINICAL DATA:  Status post thoracentesis. EXAM: CHEST  1 VIEW COMPARISON:  Chest x-ray 11/26/1998 19. FINDINGS: Mediastinum and right hilar prominence again noted consistent adenopathy. Right apical mass again noted. COPD. Stable left pleural effusion. Small right pleural effusion. No evidence of pneumothorax post thoracentesis. Stable cardiomegaly. IMPRESSION: No evidence of pneumothorax post thoracentesis Electronically Signed   By: [place]   On: 11/25/2017 12:33   X-ray Chest Pa And Lateral  Result Date: 11/25/2017 CLINICAL DATA:  Hypoxia EXAM: CHEST - 2 VIEW COMPARISON:  11/24/2017.  CT 11/04/2017. FINDINGS: Mediastinal and right hilar prominence again noted consistent adenopathy. Right upper lung mass again noted. Cardiomegaly with mild bilateral interstitial prominence and bilateral pleural effusions. No pneumothorax. No acute bony abnormality. IMPRESSION: 1. Mediastinal right hilar prominence again noted consistent adenopathy. Right upper lung mass again noted without interim change. 2. Cardiomegaly with mild bilateral interstitial prominence and bilateral pleural effusions suggesting mild CHF. Electronically Signed   By: [place]   On: 11/25/2017 09:15   Ct Angio Chest  Pe W Or Wo Contrast  Result Date: 11/25/2017 CLINICAL DATA:  Right upper lobe lung mass. Right thoracentesis earlier today. Current smoker. COPD. Dyspnea. EXAM: CT ANGIOGRAPHY CHEST WITH CONTRAST TECHNIQUE: Multidetector CT imaging of the chest was performed using the standard protocol during bolus administration of intravenous contrast. Multiplanar CT image reconstructions and MIPs were obtained to evaluate the vascular  anatomy. CONTRAST:  166mL ISOVUE-370 IOPAMIDOL (ISOVUE-370) INJECTION 76% COMPARISON:  11/04/2017 chest CT. FINDINGS: Cardiovascular: The study is high quality for the evaluation of pulmonary embolism. There are no filling defects in the central, lobar, segmental or subsegmental pulmonary artery branches to suggest acute pulmonary embolism. Atherosclerotic nonaneurysmal thoracic aorta. Top-normal caliber main pulmonary artery (3.0 cm diameter). Extrinsic near occlusion of the right upper lobe pulmonary arteries due to right hilar adenopathy. Normal heart size. Moderate pericardial effusion, increased. Three-vessel coronary atherosclerosis. Mediastinum/Nodes: No discrete thyroid nodules. Unremarkable esophagus. No axillary adenopathy. Bulky 3.8 cm right paratracheal node (series 6/image 57) increased from 3.6 cm on 11/04/2017. Newly mildly enlarged 1.1 cm subcarinal node (series 6/image 73). Bulky 3.4 cm right hilar node (series 6/image 67), stable. No left hilar adenopathy. Lungs/Pleura: No pneumothorax. Small dependent right pleural effusion, slightly decreased. New small dependent left pleural effusion. Moderate centrilobular emphysema with diffuse bronchial wall thickening. Mild dependent atelectasis in both lower lobes. Posterior right upper lobe 6.9 x 5.2 cm lung mass (series 7/image 29), previously 6.8 x 5.4 cm, stable. Stable minimal sharply marginated paramediastinal consolidation in apical left upper lobe (series 7/image 18), compatible with mild radiation fibrosis. No new significant pulmonary nodules. Upper abdomen: Probable nonobstructing 4 mm interpolar left renal stone. Musculoskeletal: No aggressive appearing focal osseous lesions. Moderate thoracic spondylosis. Review of the MIP images confirms the above findings. IMPRESSION: 1. No pulmonary embolism. 2. Moderate pericardial effusion, increased. 3. Small dependent bilateral pleural effusions, slightly decreased on the right and new on the left. 4.  Stable 6.9 cm posterior right upper lobe lung mass compatible with primary bronchogenic carcinoma. 5. Stable bulky right hilar adenopathy. Increased bulky right paratracheal adenopathy. New mild subcarinal adenopathy. Findings are most compatible with nodal metastatic disease. 6. Three-vessel coronary atherosclerosis. Aortic Atherosclerosis (ICD10-I70.0) and Emphysema (ICD10-J43.9). Electronically Signed   By: Ilona Sorrel M.D.   On: 11/25/2017 17:45   Mr Jeri Cos KN Contrast  Result Date: 11/25/2017 CLINICAL DATA:  84 y/o  F; history of lung cancer, for staging. EXAM: MRI HEAD WITHOUT AND WITH CONTRAST TECHNIQUE: Multiplanar, multiecho pulse sequences of the brain and surrounding structures were obtained without and with intravenous contrast. CONTRAST:  31mL MULTIHANCE GADOBENATE DIMEGLUMINE 529 MG/ML IV SOLN COMPARISON:  07/27/2009 CT head. FINDINGS: Brain: No acute infarction, hemorrhage, hydrocephalus, extra-axial collection or mass lesion. Patchy confluentnonspecific foci of T2 FLAIR hyperintense signal abnormality in subcortical and periventricular white matter are compatible withseverechronic microvascular ischemic changes for age. Advancedbrain parenchymal volume loss. No abnormal enhancement. Several punctate foci of susceptibility hypointensity present within the pons and right greater than left posterior hemispheres compatible hemosiderin deposition of chronic microhemorrhage. Small chronic lacunar infarct within the pons. Vascular: Normal flow voids. Skull and upper cervical spine: Normal marrow signal. Sinuses/Orbits: Negative. Other: Bilateral intra-ocular lens replacement. IMPRESSION: 1. No intracranial metastatic disease identified. 2. Severe chronic microvascular ischemic changes and age advanced parenchymal volume loss of the brain. Electronically Signed   By: Kristine Garbe M.D.   On: 11/25/2017 22:09   Ir Thoracentesis Asp Pleural Space W/img Guide  Result Date:  11/25/2017 INDICATION: Patient with bilateral pleural effusions. Request  is made for diagnostic and therapeutic thoracentesis. EXAM: ULTRASOUND GUIDED DIAGNOSTIC AND THERAPEUTIC RIGHT THORACENTESIS MEDICATIONS: 10 mL 2% lidocaine COMPLICATIONS: None immediate. PROCEDURE: An ultrasound guided thoracentesis was thoroughly discussed with the patient and questions answered. The benefits, risks, alternatives and complications were also discussed. The patient understands and wishes to proceed with the procedure. Written consent was obtained. Ultrasound was performed to localize and mark an adequate pocket of fluid in the right chest. The area was then prepped and draped in the normal sterile fashion. 2% lidocaine was used for local anesthesia. Under ultrasound guidance a Safe-T-Centesis catheter was introduced. Thoracentesis was performed. The catheter was removed and a dressing applied. FINDINGS: A total of approximately 900 mL of amber fluid was removed. Samples were sent to the laboratory as requested by the clinical team. IMPRESSION: Successful ultrasound guided diagnostic and therapeutic right thoracentesis yielding 900 mL of pleural fluid. Read by: Brynda Greathouse PA-C Electronically Signed   By: Aletta Edouard M.D.   On: 11/25/2017 12:19   Medications: I have reviewed the patient's current medications. Scheduled Meds: . collagenase   Topical Daily  . enoxaparin (LOVENOX) injection  40 mg Subcutaneous Q24H  . feeding supplement (ENSURE ENLIVE)  237 mL Oral BID BM  . mouth rinse  15 mL Mouth Rinse BID   Continuous Infusions: . sodium chloride 100 mL/hr at 11/26/17 0642  . piperacillin-tazobactam (ZOSYN)  IV Stopped (11/26/17 0629)   PRN Meds:.acetaminophen **OR** acetaminophen, benzonatate, levalbuterol, lidocaine (PF), ondansetron **OR** ondansetron (ZOFRAN) IV, senna-docusate, traZODone   Assessment/Plan: Brenda Gardner a84 y.o.femalewith past medical history significant for PET + lung nodule and  tongue soft tissue mass s/p resection and radiation, tobacco use and HTNwho presents today to the ED due to shortness of breath for the past 1.5 weeks that has acutely worsened and a chronic cough.  Patient was recently seen by CT surgery on 3/15 due to a right apical mass seen on CXR. CT scan revealed a large right apical mass with postobstructive pneumonitis, bulky hilar and mediastinal adenopathy, and a pleural effusion. Per patient's CT surgeon, presentation most consistent with a bronchogenic carcinoma. Clinical stage IV (T3, N2, M1). The patient's acute worsening shortness of breath began 1.5 weeks ago. The shortness of breath is continuous, worsens with exertion, and does not resolve with rest. This is likely due to extrinsic compression on the right mainstem bronchus, bronchus intermedius and right upper and right middle lobe bronchi by the right apical mass  Shortness of breath/Chronic non-productive cough Patient underwent a CTA yesterday which demonstrated no evidence of PE. There was some moderate pericardial effusion and small bilateral pleural effusions. Remainder of imaging findings consistent with prior CT imaging. Brain MRI did not show any evidence of metastatic involvement of the bronchogenic carcinoma.Thoracentesis of right pleural effusion removed 900 mL of fluid. Fluid was exudative and showed no organisms on gram stain.   -Patient will be transitioned to PO Augmentin to empirically treat suspected post-obstructive pneumonia  -Referral tomultidisciplinary thoracic oncology clinic for additional metastatic workup (PET Scan) -Cough suppression: Tessalon pearls PRN -PT/OT  Atrial fibrillation EKG was done on arrival to the ED and showed new onset atrial fibrillation. Patient was subsequently started on IV diltiazem. It was discontinued after she went back into sinus rhythm. During rounds this morning, patient went back into atrial fibrillation but he rate is controlled. She has no  history of atrial fibrillation or arrhythmias prior to this admission. Echocardiogram done this morning showed normal LV systolic function, mild LVH, mild  diastolic dysfunction pending to evaluate for paroxysmal atrial fibrillation. Using the [facility] tool, the patient has 3.2% yearly risk of stroke. With the addition of anticoagulation, the patient risk is reduced to less than 1%. The benefit is not significant and likely does not outweigh the risk of anticoagulation. Will defer the decision of whether to start anticoagulation to oncology.   -Advised patient that if she experiences symptoms of atrial fibrillation such as chest pain, increased shortness of breath or palpitations, then return to the ED for medical therapy.    This is a Careers information officer Note.  The care of the patient was discussed with Dr. Aggie Hacker and the assessment and plan formulated with their assistance.  Please see their attached note for official documentation of the daily encounter.   LOS: 1 day   Brenda Gardner, Medical Student 11/26/2017, 2:26 PM

## 2017-11-27 ENCOUNTER — Other Ambulatory Visit: Payer: Self-pay | Admitting: Radiology

## 2017-11-27 DIAGNOSIS — R911 Solitary pulmonary nodule: Secondary | ICD-10-CM | POA: Diagnosis not present

## 2017-11-27 LAB — PATHOLOGIST SMEAR REVIEW

## 2017-11-28 ENCOUNTER — Encounter (HOSPITAL_COMMUNITY)
Admission: RE | Admit: 2017-11-28 | Discharge: 2017-11-28 | Disposition: A | Discharge status: Home / Self Care | Payer: Medicare Other | Source: Ambulatory Visit | Attending: Thoracic Surgery (Cardiothoracic Vascular Surgery) | Admitting: Thoracic Surgery (Cardiothoracic Vascular Surgery)

## 2017-11-28 DIAGNOSIS — R911 Solitary pulmonary nodule: Secondary | ICD-10-CM | POA: Insufficient documentation

## 2017-11-28 DIAGNOSIS — R918 Other nonspecific abnormal finding of lung field: Secondary | ICD-10-CM | POA: Diagnosis not present

## 2017-11-28 DIAGNOSIS — J90 Pleural effusion, not elsewhere classified: Secondary | ICD-10-CM | POA: Diagnosis not present

## 2017-11-28 LAB — GLUCOSE, CAPILLARY: GLUCOSE-CAPILLARY: 98 mg/dL (ref 65–99)

## 2017-11-28 MED ORDER — FLUDEOXYGLUCOSE F - 18 (FDG) INJECTION
5.6000 | Freq: Once | INTRAVENOUS | Status: AC | PRN
  Administered 2017-11-28: 5.6 via INTRAVENOUS

## 2017-11-28 NOTE — Discharge Summary (Signed)
Name: Brenda Gardner MRN: [id number] DOB: [birth year] 84 y.o. PCP: Patient, No Pcp Per  Date of Admission: 11/24/2017 10:21 AM Date of Discharge: 11/26/2017 Attending Physician: No att. providers found  Discharge Diagnosis: 1. Acute hypoxic respiratory failure secondary to right upper lobe mass and post obstructive pneumonia Principal Problem:   Mass of upper lobe of right lung Active Problems:   Tobacco abuse   Protein-calorie malnutrition, severe (HCC)   Pressure injury of skin   Acute hypoxemic respiratory failure (HCC)   Atrial fibrillation (HCC)   Aortic atherosclerosis ([place])   Discharge Medications: Allergies as of 11/26/2017      Reactions   Lisinopril Swelling   Swelling of tongue      Medication List    TAKE these medications   acetaminophen 325 MG tablet Commonly known as:  TYLENOL Take 650 mg by mouth every 6 (six) hours as needed for mild pain or moderate pain.   amoxicillin-clavulanate 875-125 MG tablet Commonly known as:  AUGMENTIN Take 1 tablet by mouth 2 (two) times daily for 14 days.   benzonatate 200 MG capsule Commonly known as:  TESSALON Take 1 capsule (200 mg total) by mouth 3 (three) times daily as needed for cough.   collagenase ointment Commonly known as:  SANTYL Apply topically daily.   ENSURE PLUS Liqd Take 237 mLs by mouth daily.   ibuprofen 100 MG chewable tablet Commonly known as:  ADVIL,MOTRIN Chew 200 mg by mouth every 8 (eight) hours as needed for mild pain.       Disposition and follow-up:   Brenda Gardner was discharged from [facility] in Stable condition.  At the hospital follow up visit please address:  1.   Acute hypoxic respiratory failure secondary to right upper lobe mass and post obstructive pneumonia -CTA was negative for acute pulmonary embolism -Patient was treated with zosyn for post obstructive pneumonia for a total of 2 days -She was discharged with a prolonged course of Augmentin BID - 2  week course -Please evaluate the need for her to continue antibiotics or if her course has been sufficient treatment -Patient underwent right thoracentesis with removal of 900 mL of fluid - the fluid was exudative, culture did not grow bacterial, and cytology did not reveal malignant cells  -The thoracentesis also provided a therapeutic benefit for the patient -MRI of the brain was negative for metastasis -She was discharged with a prescription for supplemental oxygen - 1 liter continuous  -Home health PT was also set up - has patient been able to work with PT to improve endurance and safe ambulation? -Please address patient's outpatient oncology work up including: PET scan, CT guided biopsy of the RUL mass, and appointment with Oncology  Atrial Fibrillation  -New onset, patient presented in atrial fibrillation with RVR -CHA2DS2VASC Score of 3 -Patient was rate controlled without medical management -She was not started on anticoagulation as the risks outweighed the benefits in this patient -Please address need for anticoagulation or rate control if benefits outweigh the risk   Tobacco Use -Patient continues to smoke 4-5 cigarettes daily, but has decreased from 15 cigarettes daily -Please continue to counsel the patient about tobacco cessation  2.  Labs / imaging needed at time of follow-up: none  3.  Pending labs/ test needing follow-up: none  Follow-up Appointments: Follow-up Information    Curt Bears, MD. Go on 12/04/2017.   Specialty:  Oncology Contact information: [place] [address] [phone]  Hospital Course by problem list: Principal Problem:   Mass of upper lobe of right lung Active Problems:   Tobacco abuse   Protein-calorie malnutrition, severe (HCC)   Pressure injury of skin   Acute hypoxemic respiratory failure (HCC)   Atrial fibrillation (HCC)   Aortic atherosclerosis ([place])   1. Acute hypoxic respiratory failure  secondary to right upper lobe mass and post obstructive pneumonia Brenda Gardner was admitted to [facility] and the Internal Medicine Teaching Service for acute hypoxia and dyspnea on exertion. Prior to admission the patient had a CT scan of the chest performed, which showed right upper lobe mass with postobstructive pneumonitis and bulky right hilar/ipsilateral mediastinal adenopathy, most consistent with primary bronchogenic carcinoma. Upon arrival to [facility], she was hypoxic, tachycardic, and normotensive. Patient had mildly elevated leukocytosis and I-stat troponin was negative. EKG revealed atrial fibrillation with RVR. CXR revealed known RUL mass and interval enlargement of her right pleural effusion and a new left small pleural effusion. In [facility], she was started on IV antibiotics for treatment of CAP. These antibiotics were discontinued and she was started on zosyn for treatment of post obstructive pneumonia. She was also treated supportively with supplemental oxygen, breathing treatments and cough suppressants. The patient also underwent IR guided right thoracentesis, with removal of 900 mL of fluid. She tolerated the procedure well and had improvement in her dyspnea. The pleural fluid was exudative, bacterial culture was negative, and cytology did not reveal malignant cells. CTA of chest was negative for acute pulmonary embolism. The patient was already underoging outpatient work up for evaluation of the RUL mass. She was scheudled for MRI as an outpatient, and MRI of the brain was able to be completed while hospitalized. The MRI brain was negative for metastatic disease. She was discharged with instructions to continue her outpatient oncology evaluation: including PET scan and CT guided biopsy of mass. She was scheduled to see Dr. Earlie Server at [facility] on 12/04/2017.    2. Atrial fibrillation with RVR In the [facility], EKG revealed atrial fibrillation with rapid ventricular rate. She was  treated with one time dose of IV diltiazem and spontaneously converted back to sinus rhythm. Her rhythm was monitored daily with telemetry. She converted back to atrial fibrillation during hospitalization, but was rate controlled. This was new onset atrial fibrillation. CHA2DS2VASC score of 3 with a yearly stroke risk of 3.2%. TSH level was within normal limits. At the time of hospitalization, the patient's risk of of bleeding outweighed the benefit of starting anticoagulation. She was also rate controlled without medical management, and was note started on any medication for rate control.     Discharge Vitals:   BP (!) 105/56   Pulse 99   Temp 98.3 F (36.8 C) (Oral)   Resp 20   Ht 5\' 6"  (1.676 m)   Wt 113 lb 5.1 oz (51.4 kg)   SpO2 98%   BMI 18.29 kg/m   Pertinent Labs, Studies, and Procedures:  CBC Latest Ref Rng & Units 11/26/2017 11/25/2017 11/24/2017  WBC 4.0 - 10.5 K/uL 10.5 13.4(H) 14.2(H)  Hemoglobin 12.0 - 15.0 g/dL 9.7(L) 9.8(L) 12.0  Hematocrit 36.0 - 46.0 % 29.3(L) 30.4(L) 35.8(L)  Platelets 150 - 400 K/uL 262 311 343   BMP Latest Ref Rng & Units 11/26/2017 11/25/2017 11/24/2017  Glucose 65 - 99 mg/dL 109(H) 85 97  BUN 6 - 20 mg/dL 15 16 14   Creatinine 0.44 - 1.00 mg/dL 0.83 0.81 0.81  BUN/Creat Ratio 12 -  28 - - -  Sodium 135 - 145 mmol/L 131(L) 133(L) 131(L)  Potassium 3.5 - 5.1 mmol/L 3.6 4.0 4.5  Chloride 101 - 111 mmol/L 101 101 96(L)  CO2 22 - 32 mmol/L 21(L) 22 22  Calcium 8.9 - 10.3 mg/dL 8.2(L) 8.1(L) 8.8(L)   Chest Xray FINDINGS: Known right upper lobe mass lesion is again identified. Right hilar adenopathy is noted. Right pleural effusion is noted which appears larger than that seen on recent CT. New small left pleural effusion is noted. The remainder of the left lung is clear. Cardiac shadow is within normal limits.  IMPRESSION: Known right upper lobe lung mass with associated effusion.  New left pleural effusion is noted.  CTA of  Chest IMPRESSION: 1. No pulmonary embolism. 2. Moderate pericardial effusion, increased. 3. Small dependent bilateral pleural effusions, slightly decreased on the right and new on the left. 4. Stable 6.9 cm posterior right upper lobe lung mass compatible with primary bronchogenic carcinoma. 5. Stable bulky right hilar adenopathy. Increased bulky right paratracheal adenopathy. New mild subcarinal adenopathy. Findings are most compatible with nodal metastatic disease. 6. Three-vessel coronary atherosclerosis.  MRI Brain WO Contrast FINDINGS: Brain: No acute infarction, hemorrhage, hydrocephalus, extra-axial collection or mass lesion. Patchy confluentnonspecific foci of T2 FLAIR hyperintense signal abnormality in subcortical and periventricular white matter are compatible withseverechronic microvascular ischemic changes for age. Advancedbrain parenchymal volume loss. No abnormal enhancement. Several punctate foci of susceptibility hypointensity present within the pons and right greater than left posterior hemispheres compatible hemosiderin deposition of chronic microhemorrhage. Small chronic lacunar infarct within the pons.  Vascular: Normal flow voids.  Skull and upper cervical spine: Normal marrow signal.  Sinuses/Orbits: Negative.  Other: Bilateral intra-ocular lens replacement.  IMPRESSION: 1. No intracranial metastatic disease identified. 2. Severe chronic microvascular ischemic changes and age advanced parenchymal volume loss of the brain.  Discharge Instructions: Discharge Instructions    Call MD for:  difficulty breathing, headache or visual disturbances   Complete by:  As directed    Call MD for:  extreme fatigue   Complete by:  As directed    Call MD for:  persistant nausea and vomiting   Complete by:  As directed    Call MD for:  severe uncontrolled pain   Complete by:  As directed    Call MD for:  temperature >100.4   Complete by:  As directed    Diet  - low sodium heart healthy   Complete by:  As directed    Discharge instructions   Complete by:  As directed    Ms. Dunwoody,   I have continued antibiotics to treat a pneumonia that likely developed near the mass in your lung. I have prescribed you Augmentin 875-125 mg twice daily. Please take Augmentin for 2 weeks duration, starting on 11/27/2017 and ending on 12/11/2017 unless your oncologist tells you to stop. I have also prescribed you tessalon pearls for cough suppression. I have sent these prescriptions to your pharmacy.   You did NOT have a blood clot in your lung and you had NO evidence of cancer in your brain. The ultrasound of your heart showed that your heart is pumping normally.  Please continue your outpatient work up for your lung mass, including the PET scan and establishment of care with your Oncologist.  Your heart goes in and out of an abnormal rhythm called atrial fibrillation. We did not start you on any medications for this. If you experience worsening shortness of breath, lightheadedness, fainting, or  sensation of heart racing, or chest pain please go to the emergency department to be evaluated.   Increase activity slowly   Complete by:  As directed       Signed: Melanee Spry, MD 11/28/2017, 10:01 AM   Pager: [phone]

## 2017-11-30 LAB — CULTURE, BODY FLUID W GRAM STAIN -BOTTLE: Culture: NO GROWTH

## 2017-12-01 ENCOUNTER — Encounter (HOSPITAL_COMMUNITY): Payer: Self-pay

## 2017-12-01 ENCOUNTER — Ambulatory Visit (HOSPITAL_COMMUNITY)
Admission: RE | Admit: 2017-12-01 | Discharge: 2017-12-01 | Disposition: A | Discharge status: Home / Self Care | Payer: Medicare Other | Source: Ambulatory Visit | Attending: Thoracic Surgery (Cardiothoracic Vascular Surgery) | Admitting: Thoracic Surgery (Cardiothoracic Vascular Surgery)

## 2017-12-01 DIAGNOSIS — F419 Anxiety disorder, unspecified: Secondary | ICD-10-CM | POA: Diagnosis not present

## 2017-12-01 DIAGNOSIS — R911 Solitary pulmonary nodule: Secondary | ICD-10-CM | POA: Diagnosis present

## 2017-12-01 DIAGNOSIS — R918 Other nonspecific abnormal finding of lung field: Secondary | ICD-10-CM | POA: Diagnosis not present

## 2017-12-01 DIAGNOSIS — Z923 Personal history of irradiation: Secondary | ICD-10-CM | POA: Insufficient documentation

## 2017-12-01 DIAGNOSIS — Z888 Allergy status to other drugs, medicaments and biological substances status: Secondary | ICD-10-CM | POA: Diagnosis not present

## 2017-12-01 DIAGNOSIS — Z85810 Personal history of malignant neoplasm of tongue: Secondary | ICD-10-CM | POA: Diagnosis not present

## 2017-12-01 DIAGNOSIS — F17210 Nicotine dependence, cigarettes, uncomplicated: Secondary | ICD-10-CM | POA: Diagnosis not present

## 2017-12-01 DIAGNOSIS — C3411 Malignant neoplasm of upper lobe, right bronchus or lung: Secondary | ICD-10-CM | POA: Diagnosis not present

## 2017-12-01 DIAGNOSIS — C771 Secondary and unspecified malignant neoplasm of intrathoracic lymph nodes: Secondary | ICD-10-CM | POA: Diagnosis not present

## 2017-12-01 LAB — CBC
HEMATOCRIT: 37 % (ref 36.0–46.0)
Hemoglobin: 11.8 g/dL — ABNORMAL LOW (ref 12.0–15.0)
MCH: 29.6 pg (ref 26.0–34.0)
MCHC: 31.9 g/dL (ref 30.0–36.0)
MCV: 92.7 fL (ref 78.0–100.0)
Platelets: 311 10*3/uL (ref 150–400)
RBC: 3.99 MIL/uL (ref 3.87–5.11)
RDW: 13.3 % (ref 11.5–15.5)
WBC: 9.9 10*3/uL (ref 4.0–10.5)

## 2017-12-01 LAB — PROTIME-INR
INR: 1.18
Prothrombin Time: 14.9 seconds (ref 11.4–15.2)

## 2017-12-01 MED ORDER — FENTANYL CITRATE (PF) 100 MCG/2ML IJ SOLN
INTRAMUSCULAR | Status: AC | PRN
  Administered 2017-12-01: 25 ug via INTRAVENOUS

## 2017-12-01 MED ORDER — MIDAZOLAM HCL 2 MG/2ML IJ SOLN
INTRAMUSCULAR | Status: AC
  Filled 2017-12-01: qty 2

## 2017-12-01 MED ORDER — LIDOCAINE HCL 1 % IJ SOLN
INTRAMUSCULAR | Status: AC
  Filled 2017-12-01: qty 20

## 2017-12-01 MED ORDER — MIDAZOLAM HCL 2 MG/2ML IJ SOLN
INTRAMUSCULAR | Status: AC | PRN
  Administered 2017-12-01: 0.5 mg via INTRAVENOUS

## 2017-12-01 MED ORDER — SODIUM CHLORIDE 0.9 % IV SOLN: INTRAVENOUS | Status: DC

## 2017-12-01 MED ORDER — FENTANYL CITRATE (PF) 100 MCG/2ML IJ SOLN
INTRAMUSCULAR | Status: AC
  Filled 2017-12-01: qty 2

## 2017-12-01 MED ORDER — SODIUM CHLORIDE 0.9 % IV SOLN
INTRAVENOUS | Status: AC | PRN
  Administered 2017-12-01: 10 mL/h via INTRAVENOUS

## 2017-12-01 NOTE — Sedation Documentation (Signed)
Patient is resting comfortably. 

## 2017-12-01 NOTE — H&P (Signed)
Chief Complaint: Patient was seen in consultation today for right lung mass biopsy at the request of [place] C  Referring Physician(s): [place] C  Supervising Physician:  Dr Aletta Edouard  Patient Status: [facility] - Out-pt  History of Present Illness: Brenda Gardner is a 84 y.o. female   Hx tobacco abuse; Tongue cancer; stage 1A NSC lung cancer LUL- radiation treatment New SOB abd cough Was in hospital 3/25-27, 2019 Pneumonia  3/26 CT: IMPRESSION: 1. No pulmonary embolism. 2. Moderate pericardial effusion, increased. 3. Small dependent bilateral pleural effusions, slightly decreased on the right and new on the left. 4. Stable 6.9 cm posterior right upper lobe lung mass compatible with primary bronchogenic carcinoma. 5. Stable bulky right hilar adenopathy. Increased bulky right paratracheal adenopathy. New mild subcarinal adenopathy. Findings are most compatible with nodal metastatic disease. 6. Three-vessel coronary atherosclerosis.  3/26 Thoracentesis: No growth  3/29 PET: IMPRESSION: 7.6 cm posterior right upper lobe mass, compatible with primary bronchogenic neoplasm. Associated mediastinal and right hilar nodal metastases. Small to moderate bilateral pleural effusions, right greater than left. Suspected 10 mm left adrenal metastasis, although indeterminate. 9 mm soft tissue nodule beneath the anterior abdominal wall, nonspecific but without appreciable hypermetabolism. Attention on follow-up is suggested to exclude peritoneal disease.  Scheduled now for Right lung mass biopsy per Dr Roxan Hockey   Past Medical History:  Diagnosis Date  . Anxiety    SINCE THIS IS COMING UP  . Arthritis   . Cancer ([place])   . Cataract   . Complication of anesthesia   . Hemochromatosis   . Hypertension   . Lesion of left lung    ONLY DOING RADIATION  . PONV (postoperative nausea and vomiting)    56 YRS AGO DURING CHILDBIRTH  . Radiation  03/15/14-03/28/14   left apical nodule 50 gray    Past Surgical History:  Procedure Laterality Date  . APPENDECTOMY    . EYE SURGERY     BIL CATARACTS  . GLOSSECTOMY Left 01/07/2014   Procedure: PARTIAL GLOSSECTOMY;  Surgeon: [name], MD;  Location: [place];  Service: ENT;  Laterality: Left;  . IR THORACENTESIS ASP PLEURAL SPACE W/IMG GUIDE  11/25/2017  . RADICAL NECK DISSECTION Left 01/07/2014   Procedure: LEFT  NECK DISSECTION WITH FROZEN SECTION;  Surgeon: [name], MD;  Location: [place];  Service: ENT;  Laterality: Left;  . TONSILLECTOMY    . TUBAL LIGATION    . VEIN LIGATION Bilateral 1980's    Allergies: Lisinopril  Medications: Prior to Admission medications   Medication Sig Start Date End Date Taking? Authorizing Provider  amoxicillin-clavulanate (AUGMENTIN) 875-125 MG tablet Take 1 tablet by mouth 2 (two) times daily for 14 days. 11/27/17 12/11/17 Yes Lacroce, Hulen Shouts, MD  benzonatate (TESSALON) 200 MG capsule Take 1 capsule (200 mg total) by mouth 3 (three) times daily as needed for cough. 11/26/17  Yes Lacroce, Hulen Shouts, MD  collagenase (SANTYL) ointment Apply topically daily. 11/27/17  Yes Lacroce, Hulen Shouts, MD  ENSURE PLUS (ENSURE PLUS) LIQD Take 237 mLs by mouth daily.   Yes [provider]  ibuprofen (ADVIL,MOTRIN) 100 MG chewable tablet Chew 200 mg by mouth every 8 (eight) hours as needed for mild pain.    Yes [provider]  acetaminophen (TYLENOL) 325 MG tablet Take 650 mg by mouth every 6 (six) hours as needed for mild pain or moderate pain.     [provider]     Family History  Problem Relation Age of  Onset  . Mesothelioma Father     Social History   Socioeconomic History  . Marital status: Divorced    Spouse name: Not on file  . Number of children: 5  . Years of education: Not on file  . Highest education level: Not on file  Occupational History  . Not on file  Social Needs  . Financial resource strain: Not on file    . Food insecurity:    Worry: Not on file    Inability: Not on file  . Transportation needs:    Medical: Not on file    Non-medical: Not on file  Tobacco Use  . Smoking status: Current Every Day Smoker    Packs/day: 0.50    Years: 25.00    Pack years: 12.50    Types: Cigarettes  . Smokeless tobacco: Never Used  Substance and Sexual Activity  . Alcohol use: Yes    Alcohol/week: 3.5 oz    Types: 7 Standard drinks or equivalent per week  . Drug use: No  . Sexual activity: Not on file  Lifestyle  . Physical activity:    Days per week: Not on file    Minutes per session: Not on file  . Stress: Not on file  Relationships  . Social connections:    Talks on phone: Not on file    Gets together: Not on file    Attends religious service: Not on file    Active member of club or organization: Not on file    Attends meetings of clubs or organizations: Not on file    Relationship status: Not on file  Other Topics Concern  . Not on file  Social History Narrative  . Not on file      Review of Systems: A 12 point ROS discussed and pertinent positives are indicated in the HPI above.  All other systems are negative.  Review of Systems  Constitutional: Positive for activity change, appetite change and fatigue. Negative for fever and unexpected weight change.  Respiratory: Positive for cough and shortness of breath.   Cardiovascular: Negative for chest pain.  Gastrointestinal: Negative for abdominal pain.  Skin:       Skin is thin Fluid build up noted Bilat arms: left worse than right Literal fluid noted pooling beneath skin  Neurological: Positive for weakness.  Psychiatric/Behavioral: Negative for behavioral problems and confusion.    Vital Signs: BP (!) 160/78   Pulse 73   Temp (!) 97.3 F (36.3 C)   Resp 18   Ht 5\' 6"  (1.676 m)   Wt 107 lb (48.5 kg)   SpO2 100%   BMI 17.27 kg/m   Physical Exam  Constitutional: She is oriented to person, place, and time.   Cardiovascular: Normal rate and regular rhythm.  Pulmonary/Chest: Effort normal. She has no wheezes. She has rales.  Diminished on right  Abdominal: Soft. Bowel sounds are normal.  Musculoskeletal: Normal range of motion.  Neurological: She is alert and oriented to person, place, and time.  Skin: Skin is warm and dry.  pooling fluid build up beneath skin Bilat arms Left worse than right  Psychiatric: She has a normal mood and affect. Her behavior is normal. Judgment and thought content normal.  Nursing note and vitals reviewed.   Imaging: Dg Chest 1 View  Result Date: 11/25/2017 CLINICAL DATA:  Status post thoracentesis. EXAM: CHEST  1 VIEW COMPARISON:  Chest x-ray 11/26/1998 19. FINDINGS: Mediastinum and right hilar prominence again noted consistent adenopathy. Right apical  mass again noted. COPD. Stable left pleural effusion. Small right pleural effusion. No evidence of pneumothorax post thoracentesis. Stable cardiomegaly. IMPRESSION: No evidence of pneumothorax post thoracentesis Electronically Signed   By: [place]   On: 11/25/2017 12:33   X-ray Chest Pa And Lateral  Result Date: 11/25/2017 CLINICAL DATA:  Hypoxia EXAM: CHEST - 2 VIEW COMPARISON:  11/24/2017.  CT 11/04/2017. FINDINGS: Mediastinal and right hilar prominence again noted consistent adenopathy. Right upper lung mass again noted. Cardiomegaly with mild bilateral interstitial prominence and bilateral pleural effusions. No pneumothorax. No acute bony abnormality. IMPRESSION: 1. Mediastinal right hilar prominence again noted consistent adenopathy. Right upper lung mass again noted without interim change. 2. Cardiomegaly with mild bilateral interstitial prominence and bilateral pleural effusions suggesting mild CHF. Electronically Signed   By: Marcello Moores  Register   On: 11/25/2017 09:15   Dg Chest 2 View  Result Date: 11/24/2017 CLINICAL DATA:  Shortness of breath and cough EXAM: CHEST - 2 VIEW COMPARISON:  10/30/2017 plain  film, 11/04/2017 CT of the chest FINDINGS: Known right upper lobe mass lesion is again identified. Right hilar adenopathy is noted. Right pleural effusion is noted which appears larger than that seen on recent CT. New small left pleural effusion is noted. The remainder of the left lung is clear. Cardiac shadow is within normal limits. IMPRESSION: Known right upper lobe lung mass with associated effusion. New left pleural effusion is noted. Electronically Signed   By: Inez Catalina M.D.   On: 11/24/2017 13:17   Ct Chest Wo Contrast  Result Date: 11/05/2017 CLINICAL DATA:  Head neck cancer, worsening cough and shortness of breath. EXAM: CT CHEST WITHOUT CONTRAST TECHNIQUE: Multidetector CT imaging of the chest was performed following the standard protocol without IV contrast. COMPARISON:  Chest radiograph 10/30/2017 and CT chest 06/20/2016. FINDINGS: Cardiovascular: Atherosclerotic calcification of the arterial vasculature, including coronary arteries and aortic valve. Heart is at the upper limits of normal in size. No pericardial effusion. Mediastinum/Nodes: Mediastinal adenopathy measures up to 3.2 cm in short axis in the low right paratracheal station. Right hilar adenopathy measures approximately 2.5 cm in short axis. No axillary adenopathy. Esophagus is grossly unremarkable. Lungs/Pleura: Right upper lobe mass measures 5.4 x 6.8 cm and has a long border of pleural contact. Associated peribronchial thickening extending to the right hilum with patchy ground-glass in the posterior segment right upper lobe. Moderate centrilobular emphysema. Small to moderate right pleural effusion. Subpleural thickening and focal bullous disease in the medial aspect of the left upper lobe, unchanged. Probable subpleural atelectasis in the left lower lobe (image 121). Extrinsic compression on the right mainstem bronchus, bronchus intermedius and right upper and right middle lobe bronchi. Airway is otherwise unremarkable. Upper  Abdomen: Liver margin is irregular. Visualized portions of the liver, adrenal glands and right kidney are unremarkable. Left renal stone versus renal vascular calcification. Visualized portions of the spleen, pancreas, stomach and bowel are grossly unremarkable. No upper abdominal adenopathy. Musculoskeletal: Degenerative changes in the spine. Large area of partially imaged lucency in the right humeral head is unchanged. No worrisome lytic or sclerotic lesions. Mild compression of the inferior endplate of L2 is unchanged. IMPRESSION: 1. Right upper lobe mass with postobstructive pneumonitis in the right upper lobe and bulky right hilar/ipsilateral mediastinal adenopathy, most consistent with primary bronchogenic carcinoma and at least stage IIIA disease. If the small to moderate right pleural effusion is malignant, then findings are consistent with stage IV disease. 2. Aortic atherosclerosis (ICD10-170.0). Coronary artery calcification. 3.  Emphysema (  ICD10-J43.9). 4. Cirrhosis. 5. Left renal stone versus vascular calcification. Electronically Signed   By: Lorin Picket M.D.   On: 11/05/2017 08:05   Ct Angio Chest Pe W Or Wo Contrast  Result Date: 11/25/2017 CLINICAL DATA:  Right upper lobe lung mass. Right thoracentesis earlier today. Current smoker. COPD. Dyspnea. EXAM: CT ANGIOGRAPHY CHEST WITH CONTRAST TECHNIQUE: Multidetector CT imaging of the chest was performed using the standard protocol during bolus administration of intravenous contrast. Multiplanar CT image reconstructions and MIPs were obtained to evaluate the vascular anatomy. CONTRAST:  178mL ISOVUE-370 IOPAMIDOL (ISOVUE-370) INJECTION 76% COMPARISON:  11/04/2017 chest CT. FINDINGS: Cardiovascular: The study is high quality for the evaluation of pulmonary embolism. There are no filling defects in the central, lobar, segmental or subsegmental pulmonary artery branches to suggest acute pulmonary embolism. Atherosclerotic nonaneurysmal thoracic  aorta. Top-normal caliber main pulmonary artery (3.0 cm diameter). Extrinsic near occlusion of the right upper lobe pulmonary arteries due to right hilar adenopathy. Normal heart size. Moderate pericardial effusion, increased. Three-vessel coronary atherosclerosis. Mediastinum/Nodes: No discrete thyroid nodules. Unremarkable esophagus. No axillary adenopathy. Bulky 3.8 cm right paratracheal node (series 6/image 57) increased from 3.6 cm on 11/04/2017. Newly mildly enlarged 1.1 cm subcarinal node (series 6/image 73). Bulky 3.4 cm right hilar node (series 6/image 67), stable. No left hilar adenopathy. Lungs/Pleura: No pneumothorax. Small dependent right pleural effusion, slightly decreased. New small dependent left pleural effusion. Moderate centrilobular emphysema with diffuse bronchial wall thickening. Mild dependent atelectasis in both lower lobes. Posterior right upper lobe 6.9 x 5.2 cm lung mass (series 7/image 29), previously 6.8 x 5.4 cm, stable. Stable minimal sharply marginated paramediastinal consolidation in apical left upper lobe (series 7/image 18), compatible with mild radiation fibrosis. No new significant pulmonary nodules. Upper abdomen: Probable nonobstructing 4 mm interpolar left renal stone. Musculoskeletal: No aggressive appearing focal osseous lesions. Moderate thoracic spondylosis. Review of the MIP images confirms the above findings. IMPRESSION: 1. No pulmonary embolism. 2. Moderate pericardial effusion, increased. 3. Small dependent bilateral pleural effusions, slightly decreased on the right and new on the left. 4. Stable 6.9 cm posterior right upper lobe lung mass compatible with primary bronchogenic carcinoma. 5. Stable bulky right hilar adenopathy. Increased bulky right paratracheal adenopathy. New mild subcarinal adenopathy. Findings are most compatible with nodal metastatic disease. 6. Three-vessel coronary atherosclerosis. Aortic Atherosclerosis (ICD10-I70.0) and Emphysema  (ICD10-J43.9). Electronically Signed   By: Ilona Sorrel M.D.   On: 11/25/2017 17:45   Mr Jeri Cos XL Contrast  Result Date: 11/25/2017 CLINICAL DATA:  84 y/o  F; history of lung cancer, for staging. EXAM: MRI HEAD WITHOUT AND WITH CONTRAST TECHNIQUE: Multiplanar, multiecho pulse sequences of the brain and surrounding structures were obtained without and with intravenous contrast. CONTRAST:  42mL MULTIHANCE GADOBENATE DIMEGLUMINE 529 MG/ML IV SOLN COMPARISON:  07/27/2009 CT head. FINDINGS: Brain: No acute infarction, hemorrhage, hydrocephalus, extra-axial collection or mass lesion. Patchy confluentnonspecific foci of T2 FLAIR hyperintense signal abnormality in subcortical and periventricular white matter are compatible withseverechronic microvascular ischemic changes for age. Advancedbrain parenchymal volume loss. No abnormal enhancement. Several punctate foci of susceptibility hypointensity present within the pons and right greater than left posterior hemispheres compatible hemosiderin deposition of chronic microhemorrhage. Small chronic lacunar infarct within the pons. Vascular: Normal flow voids. Skull and upper cervical spine: Normal marrow signal. Sinuses/Orbits: Negative. Other: Bilateral intra-ocular lens replacement. IMPRESSION: 1. No intracranial metastatic disease identified. 2. Severe chronic microvascular ischemic changes and age advanced parenchymal volume loss of the brain. Electronically Signed   By: [place]ia [place]  [place] M.D.   On: 11/25/2017 22:09   Nm Pet Image Initial (pi) Skull Base To Thigh  Result Date: 11/28/2017 CLINICAL DATA:  Initial treatment strategy for right lung mass. EXAM: NUCLEAR MEDICINE PET SKULL BASE TO THIGH TECHNIQUE: 5.6 mCi F-18 FDG was injected intravenously. Full-ring PET imaging was performed from the skull base to thigh after the radiotracer. CT data was obtained and used for attenuation correction and anatomic localization. Fasting blood glucose: 98 mg/dl  COMPARISON:  CTA chest dated 11/25/2017.  PET-CT dated 12/09/2013. FINDINGS: Mediastinal blood pool activity: SUV max 1.8 NECK: No hypermetabolic cervical lymphadenopathy. Incidental CT findings: none CHEST: 5.3 x 7.6 cm posterior right upper lobe mass (series 8/image 14), max SUV 11.7 with suspected central necrosis, compatible with primary bronchogenic neoplasm. Dominant 3.9 cm right paratracheal node (series 4/image 71), max SUV 13.9 with suspected central necrosis. 2.6 cm short axis right hilar node, max SUV 17.4. 10 mm short axis subcarinal node, max SUV 5.4. Small to moderate bilateral pleural effusions, right greater than left. Incidental CT findings: Ectasia of the ascending thoracic aorta, measuring 3.8 cm. Atherosclerotic calcifications of the aortic arch. Three vessel coronary atherosclerosis. Trace inferior pericardial fluid. ABDOMEN/PELVIS: No hypermetabolic abdominopelvic lymphadenopathy. 9 mm soft tissue nodule beneath the anterior abdominal wall (series 4/image 119), nonspecific but without appreciable hypermetabolism. Focal hypermetabolism beneath the left mid abdominal wall on PET image 127 is without CT correlate. No abnormal hypermetabolism in the liver, spleen, pancreas, or right adrenal gland. Possible 10 mm left adrenal nodule (series 4/image 113), equivocal. New hypermetabolism involving the left adrenal gland, max SUV 2.6. This combination of findings is concerning for early adrenal metastasis. Nodular hepatic contour, including an exophytic nodular lesion along the inferior aspect of the right hepatic lobe (series 4/image 130), chronic. No appreciable hypermetabolism. Focal hypermetabolism in the right mid/lower abdomen on PET image 141 is without CT correlate. Incidental CT findings: Atherosclerotic calcifications of the abdominal aorta and branch vessels. Sigmoid diverticulosis, without evidence of diverticulitis. SKELETON: No focal hypermetabolic activity to suggest skeletal metastasis.  Incidental CT findings: Mild sclerosis involving the bilateral femoral heads. IMPRESSION: 7.6 cm posterior right upper lobe mass, compatible with primary bronchogenic neoplasm. Associated mediastinal and right hilar nodal metastases. Small to moderate bilateral pleural effusions, right greater than left. Suspected 10 mm left adrenal metastasis, although indeterminate. 9 mm soft tissue nodule beneath the anterior abdominal wall, nonspecific but without appreciable hypermetabolism. Attention on follow-up is suggested to exclude peritoneal disease. Electronically Signed   By: Julian Hy M.D.   On: 11/28/2017 13:51   Ir Thoracentesis Asp Pleural Space W/img Guide  Result Date: 11/25/2017 INDICATION: Patient with bilateral pleural effusions. Request is made for diagnostic and therapeutic thoracentesis. EXAM: ULTRASOUND GUIDED DIAGNOSTIC AND THERAPEUTIC RIGHT THORACENTESIS MEDICATIONS: 10 mL 2% lidocaine COMPLICATIONS: None immediate. PROCEDURE: An ultrasound guided thoracentesis was thoroughly discussed with the patient and questions answered. The benefits, risks, alternatives and complications were also discussed. The patient understands and wishes to proceed with the procedure. Written consent was obtained. Ultrasound was performed to localize and mark an adequate pocket of fluid in the right chest. The area was then prepped and draped in the normal sterile fashion. 2% lidocaine was used for local anesthesia. Under ultrasound guidance a Safe-T-Centesis catheter was introduced. Thoracentesis was performed. The catheter was removed and a dressing applied. FINDINGS: A total of approximately 900 mL of amber fluid was removed. Samples were sent to the laboratory as requested by the clinical team. IMPRESSION: Successful ultrasound guided diagnostic  and therapeutic right thoracentesis yielding 900 mL of pleural fluid. Read by: Brynda Greathouse PA-C Electronically Signed   By: Aletta Edouard M.D.   On: 11/25/2017  12:19    Labs:  CBC: Recent Labs    11/24/17 1054 11/25/17 0219 11/26/17 0338 12/01/17 0947  WBC 14.2* 13.4* 10.5 9.9  HGB 12.0 9.8* 9.7* 11.8*  HCT 35.8* 30.4* 29.3* 37.0  PLT 343 311 262 311    COAGS: Recent Labs    12/01/17 0947  INR 1.18    BMP: Recent Labs    10/30/17 1636 11/24/17 1054 11/25/17 0219 11/26/17 0338  NA 136 131* 133* 131*  K 4.9 4.5 4.0 3.6  CL 97 96* 101 101  CO2 23 22 22  21*  GLUCOSE 96 97 85 109*  BUN 11 14 16 15   CALCIUM 9.1 8.8* 8.1* 8.2*  CREATININE 0.75 0.81 0.81 0.83  GFRNONAA 74 >60 >60 >60  GFRAA 86 >60 >60 >60    LIVER FUNCTION TESTS: Recent Labs    10/30/17 1636 11/24/17 1054 11/25/17 0219 11/26/17 0338  BILITOT 0.4 0.9 0.8 0.8  AST 19 25 20 20   ALT 8 8* 9* 9*  ALKPHOS 105 95 83 69  PROT 6.2 6.7 5.7* 5.2*  ALBUMIN 3.6 2.9* 2.5* 2.3*    TUMOR MARKERS: No results for input(s): AFPTM, CEA, CA199, CHROMGRNA in the last 8760 hours.  Assessment and Plan:  + smoker Hx LUL lung cancer- post radiation tx RUL mass +PET Thora no growth Now scheduled for biopsy per Dr Roxan Hockey Risks and benefits discussed with the patient including, but not limited to bleeding, hemoptysis, respiratory failure requiring intubation, infection, pneumothorax requiring chest tube placement, stroke from air embolism or even death.  All of the patient's questions were answered, patient is agreeable to proceed. Consent signed and in chart.  Thank you for this interesting consult.  I greatly enjoyed meeting DANINE HOR and look forward to participating in their care.  A copy of this report was sent to the requesting provider on this date.  Electronically Signed: Lavonia Drafts, PA-C 12/01/2017, 10:22 AM   I spent a total of  30 Minutes   in face to face in clinical consultation, greater than 50% of which was counseling/coordinating care for right lung mass bx

## 2017-12-01 NOTE — Procedures (Signed)
Interventional Radiology Procedure Note  Procedure: CT guided biopsy of RUL lung mass  Complications: None  Estimated Blood Loss: < 10 mL  Findings: CT guided biopsy of nearly 8 cm RUL lung mass.  No PTX or hemorrhage.  Venetia Night. Kathlene Cote, M.D Pager:  [phone]

## 2017-12-01 NOTE — Discharge Instructions (Signed)
**Note Brenda Gardner-identified via Obfuscation** Lung Biopsy, Care After °This sheet gives you information about how to care for yourself after your procedure. Your health care provider may also give you more specific instructions depending on the type of biopsy you had. If you have problems or questions, contact your health care provider. °What can I expect after the procedure? °After the procedure, it is common to have: °· A cough. °· A sore throat. °· Pain where a needle, bronchoscope, or incision was used to collect a biopsy sample (biopsy site). ° °Follow these instructions at home: °Medicines °· Take over-the-counter and prescription medicines only as told by your health care provider. °· Do not drive for 24 hours if you were given a sedative. °· Do not drink alcohol while taking pain medicine. °· Do not drive or use heavy machinery while taking prescription pain medicine. °· To prevent or treat constipation while you are taking prescription pain medicine, your health care provider may recommend that you: °? Drink enough fluid to keep your urine clear or pale yellow. °? Take over-the-counter or prescription medicines. °? Eat foods that are high in fiber, such as fresh fruits and vegetables, whole grains, and beans. °? Limit foods that are high in fat and processed sugars, such as fried and sweet foods. °Activity °· If you had an incision during your procedure, avoid activities that may pull the incision site open. °· Return to your normal activities as told by your health care provider. Ask your health care provider what activities are safe for you. °If you had an open biopsy:  °· Follow instructions from your health care provider about how to take care of your incision. Make sure you: °? Wash your hands with soap and water before you change your bandage (dressing). If soap and water are not available, use hand sanitizer. °? Change your dressing as told by your health care provider. °? Leave stitches (sutures), skin glue, or adhesive strips in place. These  skin closures may need to stay in place for 2 weeks or longer. If adhesive strip edges start to loosen and curl up, you may trim the loose edges. Do not remove adhesive strips completely unless your health care provider tells you to do that. °· Check your incision area every day for signs of infection. Check for: °? Redness, swelling, or pain. °? Fluid or blood. °? Warmth. °? Pus or a bad smell. °General instructions °· It is up to you to get the results of your procedure. Ask your health care provider, or the department that is doing the procedure, when your results will be ready. °Contact a health care provider if: °· You have a fever. °· You have redness, swelling, or pain around your biopsy site. °· You have fluid or blood coming from your biopsy site. °· Your biopsy site feels warm to the touch. °· You have pus or a bad smell coming from your biopsy site. °Get help right away if: °· You cough up blood. °· You have trouble breathing. °· You have chest pain. °Summary °· After the procedure, it is common to have a sore throat and a cough. °· Return to your normal activities as told by your health care provider. Ask your health care provider what activities are safe for you. °· Take over-the-counter and prescription medicines only as told by your health care provider. °· Report any unusual symptoms to your health care provider. °This information is not intended to replace advice given to you by your health care provider. Make sure  **Note Brenda Gardner-identified via Obfuscation** you discuss any questions you have with your health care provider. °Document Released: 09/17/2016 Document Revised: 09/17/2016 Document Reviewed: 09/17/2016 °Elsevier Interactive Patient Education © 2018 [employer] ° °

## 2017-12-02 ENCOUNTER — Ambulatory Visit: Payer: Medicare Other | Admitting: Family Medicine

## 2017-12-02 ENCOUNTER — Encounter: Payer: Self-pay | Admitting: Family Medicine

## 2017-12-02 ENCOUNTER — Other Ambulatory Visit: Payer: Self-pay

## 2017-12-02 VITALS — BP 118/82 | HR 91 | Temp 98.5°F | Ht 66.0 in | Wt 107.0 lb

## 2017-12-02 DIAGNOSIS — C3491 Malignant neoplasm of unspecified part of right bronchus or lung: Secondary | ICD-10-CM | POA: Diagnosis not present

## 2017-12-02 DIAGNOSIS — I4891 Unspecified atrial fibrillation: Secondary | ICD-10-CM

## 2017-12-02 DIAGNOSIS — R600 Localized edema: Secondary | ICD-10-CM

## 2017-12-02 NOTE — Progress Notes (Signed)
4/2/20195:03 PM  Brenda Gardner 1933-05-17, 84 y.o. female [id number]  Chief Complaint  Patient presents with  . Edema    daughter says she is experiencing edema is both arms unknown reason    HPI:   Patient is a 84 y.o. female with past medical history significant for RUL lung mass cw primary lung cancer per PET scan who presents today for concerns of bilateral UE edema.  Patient was admitted from 3/25-3/27: Afib no RVR no anticoag, postobstructive Pneumonitis/PNA, thoracentesis not malignant, acute hypoxic resp failure - home O2 1L. She was discharged on 2 weeks of augmentin.  Daughter states that since then patient has swelling of both forearms. She denies any redness or warmth. She denies any worsening SOB. She denies any tenderness. They have been trying to elevate arms, but patient spends most time with arms crossed or resting of wooden arm rests.   She has had her CT guided biopsy. Her brain MRI and pleural effusion were non malignant.  PET scan shows mediastinal and hilar LN mets, possible adrenal met She sees onc later this week.   Patient states that she has quit smoking since this last hospitalization  She otherwise feels ok, SOB at baseline. Appetite decreased, has been losing weight, trying to supplement with ensure. She lives with her daughter.   Depression screen [facility] 2/9 12/02/2017 11/20/2017 10/30/2017  Decreased Interest 0 0 0  Down, Depressed, Hopeless 0 0 0  PHQ - 2 Score 0 0 0    Allergies  Allergen Reactions  . Lisinopril Swelling    Swelling of tongue    Prior to Admission medications   Medication Sig Start Date End Date Taking? Authorizing Provider  acetaminophen (TYLENOL) 325 MG tablet Take 650 mg by mouth every 6 (six) hours as needed for mild pain or moderate pain.    Yes [provider]  amoxicillin-clavulanate (AUGMENTIN) 875-125 MG tablet Take 1 tablet by mouth 2 (two) times daily for 14 days. 11/27/17 12/11/17 Yes Lacroce, Hulen Shouts, MD    benzonatate (TESSALON) 200 MG capsule Take 1 capsule (200 mg total) by mouth 3 (three) times daily as needed for cough. 11/26/17  Yes Lacroce, Hulen Shouts, MD  collagenase (SANTYL) ointment Apply topically daily. 11/27/17  Yes Lacroce, Hulen Shouts, MD  ENSURE PLUS (ENSURE PLUS) LIQD Take 237 mLs by mouth daily.   Yes [provider]  ibuprofen (ADVIL,MOTRIN) 100 MG chewable tablet Chew 200 mg by mouth every 8 (eight) hours as needed for mild pain.    Yes [provider]    Past Medical History:  Diagnosis Date  . Anxiety    SINCE THIS IS COMING UP  . Arthritis   . Cancer ([place])   . Cataract   . Complication of anesthesia   . Hemochromatosis   . Hypertension   . Lesion of left lung    ONLY DOING RADIATION  . PONV (postoperative nausea and vomiting)    56 YRS AGO DURING CHILDBIRTH  . Radiation 03/15/14-03/28/14   left apical nodule 50 gray    Past Surgical History:  Procedure Laterality Date  . APPENDECTOMY    . EYE SURGERY     BIL CATARACTS  . GLOSSECTOMY Left 01/07/2014   Procedure: PARTIAL GLOSSECTOMY;  Surgeon: [name], MD;  Location: [place];  Service: ENT;  Laterality: Left;  . IR THORACENTESIS ASP PLEURAL SPACE W/IMG GUIDE  11/25/2017  . RADICAL NECK DISSECTION Left 01/07/2014   Procedure: LEFT  NECK DISSECTION WITH FROZEN SECTION;  Surgeon: [name], MD;  Location: [place];  Service: ENT;  Laterality: Left;  . TONSILLECTOMY    . TUBAL LIGATION    . VEIN LIGATION Bilateral 1980's    Social History   Tobacco Use  . Smoking status: Current Every Day Smoker    Packs/day: 0.50    Years: 25.00    Pack years: 12.50    Types: Cigarettes  . Smokeless tobacco: Never Used  Substance Use Topics  . Alcohol use: Yes    Alcohol/week: 3.5 oz    Types: 7 Standard drinks or equivalent per week    Family History  Problem Relation Age of Onset  . Mesothelioma Father     Review of Systems  Constitutional: Positive for malaise/fatigue and weight loss. Negative  for chills and fever.  Respiratory: Positive for shortness of breath. Negative for cough, hemoptysis and sputum production.   Cardiovascular: Negative for chest pain, palpitations and leg swelling.  Gastrointestinal: Negative for abdominal pain, nausea and vomiting.  Musculoskeletal: Negative for falls.  Neurological: Negative for dizziness, sensory change, speech change and focal weakness.     OBJECTIVE:  Blood pressure 118/82, pulse 91, temperature 98.5 F (36.9 C), temperature source Oral, height _0  (1.676 m), weight 107 lb (48.5 kg), SpO2 96 %.  Wt Readings from Last 3 Encounters:  12/02/17 107 lb (48.5 kg)  12/01/17 107 lb (48.5 kg)  11/26/17 113 lb 5.1 oz (51.4 kg)    Physical Exam  Constitutional: She is oriented to person, place, and time and well-developed, well-nourished, and in no distress.  HENT:  Head: Normocephalic and atraumatic.  Mouth/Throat: Oropharynx is clear and moist. No oropharyngeal exudate.  Eyes: Pupils are equal, round, and reactive to light. EOM are normal. No scleral icterus.  Neck: Neck supple.  Cardiovascular: Normal rate, regular rhythm and normal heart sounds. Exam reveals no gallop and no friction rub.  No murmur heard. Pulmonary/Chest: Effort normal and breath sounds normal. She has no wheezes. She has no rales.  Musculoskeletal: She exhibits no edema.  Neurological: She is alert and oriented to person, place, and time. Gait normal.  Skin: Skin is warm and dry.  BUE with dependent bullae along extensor surfaces or forearm. Scattered ecchymosis of PIV sites from recent hospitalizations. Minimal erythema, no tenderness, no warmth  patient is using 1L oxygen via [place]  ASSESSMENT and PLAN  1. Localized edema Discussed care of dependent edema and current bullae, such as off weighting, elevation and not disturbing bullae. Discussed RTC precautions.  2. Primary malignant neoplasm of right lung metastatic to other site [facility]) Patient has upcoming  appointment with onc, pending further decisions after their conversation. She is tolerating abx well. She denies any worsening SOB. Discussed continued working on increasing caloric intake.  3. Atrial fibrillation, unspecified type Patient asymptomatic, decision was made during hospitalization to not anticoagulate given increased risk of harm over benefit.     Return in about 1 month (around 12/30/2017).    Rutherford Guys, MD [facility] [place], [place] [address] Ph.  [phone] Fax [phone]

## 2017-12-02 NOTE — Patient Instructions (Signed)
     IF you received an x-ray today, you will receive an invoice from [facility]. Please contact [facility] at [phone] with questions or concerns regarding your invoice.   IF you received labwork today, you will receive an invoice from [facility]. Please contact [facility] at [phone] with questions or concerns regarding your invoice.   Our billing staff will not be able to assist you with questions regarding bills from these companies.  You will be contacted with the lab results as soon as they are available. The fastest way to get your results is to activate your My Chart account. Instructions are located on the last page of this paperwork. If you have not heard from us regarding the results in 2 weeks, please contact this office.     

## 2017-12-04 ENCOUNTER — Inpatient Hospital Stay: Payer: Medicare Other | Attending: Internal Medicine | Admitting: Internal Medicine

## 2017-12-04 ENCOUNTER — Encounter: Payer: Self-pay | Admitting: Internal Medicine

## 2017-12-04 ENCOUNTER — Encounter: Payer: Self-pay | Admitting: *Deleted

## 2017-12-04 ENCOUNTER — Inpatient Hospital Stay: Payer: Medicare Other

## 2017-12-04 ENCOUNTER — Other Ambulatory Visit: Payer: Self-pay | Admitting: *Deleted

## 2017-12-04 VITALS — BP 140/59 | HR 80 | Temp 98.4°F | Resp 17 | Ht 66.0 in | Wt 113.9 lb

## 2017-12-04 DIAGNOSIS — Z85810 Personal history of malignant neoplasm of tongue: Secondary | ICD-10-CM

## 2017-12-04 DIAGNOSIS — R918 Other nonspecific abnormal finding of lung field: Secondary | ICD-10-CM

## 2017-12-04 DIAGNOSIS — J440 Chronic obstructive pulmonary disease with acute lower respiratory infection: Secondary | ICD-10-CM | POA: Diagnosis not present

## 2017-12-04 DIAGNOSIS — F17210 Nicotine dependence, cigarettes, uncomplicated: Secondary | ICD-10-CM | POA: Insufficient documentation

## 2017-12-04 DIAGNOSIS — K7460 Unspecified cirrhosis of liver: Secondary | ICD-10-CM | POA: Insufficient documentation

## 2017-12-04 DIAGNOSIS — Z79899 Other long term (current) drug therapy: Secondary | ICD-10-CM | POA: Insufficient documentation

## 2017-12-04 DIAGNOSIS — C3411 Malignant neoplasm of upper lobe, right bronchus or lung: Secondary | ICD-10-CM | POA: Diagnosis not present

## 2017-12-04 DIAGNOSIS — K5730 Diverticulosis of large intestine without perforation or abscess without bleeding: Secondary | ICD-10-CM | POA: Insufficient documentation

## 2017-12-04 DIAGNOSIS — Z5111 Encounter for antineoplastic chemotherapy: Secondary | ICD-10-CM

## 2017-12-04 DIAGNOSIS — C3491 Malignant neoplasm of unspecified part of right bronchus or lung: Secondary | ICD-10-CM

## 2017-12-04 DIAGNOSIS — F419 Anxiety disorder, unspecified: Secondary | ICD-10-CM | POA: Diagnosis not present

## 2017-12-04 DIAGNOSIS — Z9221 Personal history of antineoplastic chemotherapy: Secondary | ICD-10-CM | POA: Insufficient documentation

## 2017-12-04 DIAGNOSIS — C029 Malignant neoplasm of tongue, unspecified: Secondary | ICD-10-CM

## 2017-12-04 DIAGNOSIS — R911 Solitary pulmonary nodule: Secondary | ICD-10-CM

## 2017-12-04 DIAGNOSIS — Z7189 Other specified counseling: Secondary | ICD-10-CM

## 2017-12-04 DIAGNOSIS — R634 Abnormal weight loss: Secondary | ICD-10-CM

## 2017-12-04 DIAGNOSIS — I10 Essential (primary) hypertension: Secondary | ICD-10-CM

## 2017-12-04 LAB — CMP (CANCER CENTER ONLY)
ALT: 7 U/L (ref 0–55)
ANION GAP: 8 (ref 3–11)
AST: 18 U/L (ref 5–34)
Albumin: 2.5 g/dL — ABNORMAL LOW (ref 3.5–5.0)
Alkaline Phosphatase: 94 U/L (ref 40–150)
BUN: 11 mg/dL (ref 7–26)
CO2: 25 mmol/L (ref 22–29)
Calcium: 8.8 mg/dL (ref 8.4–10.4)
Chloride: 99 mmol/L (ref 98–109)
Creatinine: 0.66 mg/dL (ref 0.60–1.10)
Glucose, Bld: 84 mg/dL (ref 70–140)
POTASSIUM: 4.2 mmol/L (ref 3.5–5.1)
Sodium: 132 mmol/L — ABNORMAL LOW (ref 136–145)
Total Bilirubin: 0.4 mg/dL (ref 0.2–1.2)
Total Protein: 6.2 g/dL — ABNORMAL LOW (ref 6.4–8.3)

## 2017-12-04 LAB — CBC WITH DIFFERENTIAL (CANCER CENTER ONLY)
Basophils Absolute: 0 10*3/uL (ref 0.0–0.1)
Basophils Relative: 0 %
Eosinophils Absolute: 0.1 10*3/uL (ref 0.0–0.5)
Eosinophils Relative: 1 %
HCT: 32.5 % — ABNORMAL LOW (ref 34.8–46.6)
HEMOGLOBIN: 10.8 g/dL — AB (ref 11.6–15.9)
LYMPHS ABS: 0.9 10*3/uL (ref 0.9–3.3)
LYMPHS PCT: 8 %
MCH: 29.8 pg (ref 25.1–34.0)
MCHC: 33.1 g/dL (ref 31.5–36.0)
MCV: 90 fL (ref 79.5–101.0)
Monocytes Absolute: 1.8 10*3/uL — ABNORMAL HIGH (ref 0.1–0.9)
Monocytes Relative: 15 %
NEUTROS ABS: 8.9 10*3/uL — AB (ref 1.5–6.5)
NEUTROS PCT: 76 %
Platelet Count: 333 10*3/uL (ref 145–400)
RBC: 3.61 MIL/uL — ABNORMAL LOW (ref 3.70–5.45)
RDW: 13.2 % (ref 11.2–14.5)
WBC: 11.7 10*3/uL — AB (ref 3.9–10.3)

## 2017-12-04 NOTE — Progress Notes (Signed)
Thoracic Location of Tumor / Histology: 7 cm mass at the right apex with bulky hilar and mediastinal adenopathy.    Patient presented with symptoms of: 2 month history of nonproductive cough  Biopsies revealed:   12/01/17 Diagnosis Lung, needle/core biopsy(ies), right upper lobe - NON-SMALL CELL CARCINOMA. - SEE COMMENT. Microscopic Comment The biopsies reveal a poorly differentiated non-small cell carcinoma with abundant background necrosis. The tumor cells reveal highly dysplastic nuclei with prominent nucleoli and abundant eosinophilic cytoplasm. Mitoses are conspicuous. The tumor cells are positive for cytokeratin AE1/AE3, and focally positive for cytokeratin 5/6. There are essentially negative for p63, Melan-A, Napsin-A, and TTF-1. This immunohistochemical profile is non-specific, although there are very focal features suggesting squamous differentiation. Additional studies can be performed upon clinician request.   Tobacco/Marijuana/Snuff/ETOH use: current smoker, smokes 0.5 ppd for 25 years, drinks one to two drinks seven day a week  Past/Anticipated interventions by cardiothoracic surgery, if any: not a candidate for surgery  Past/Anticipated interventions by medical oncology, if any: will see Dr. Julien Nordmann 12/04/17 - deciding on chemoradiation vs palliative radiation.  Signs/Symptoms  Weight changes, if any None  Respiratory complaints, if any: Shortness of breath with activity  Hemoptysis, if any: None  Pain issues, if any:  States that she has an area on her sacrum that causes her pain. Denies any pain associated with her lungs.  SAFETY ISSUES:  Prior radiation? 03/15/14-03/28/14 left apical nodule 50 gray  Pacemaker/ICD? No  Possible current pregnancy? No  Is the patient on methotrexate? No  Current Complaints / other details: Patient has a lot of edema in both legs. Patient had swelling in both arms last week .Patients doctor told her it was from low  protein, Vitals:   12/08/17 1319  BP: (!) 126/53  Pulse: 72  Resp: (!) 24  Temp: 97.9 F (36.6 C)  TempSrc: Oral  SpO2: 95%  Weight: 112 lb 8 oz (51 kg)   Wt Readings from Last 3 Encounters:  12/08/17 112 lb 8 oz (51 kg)  12/04/17 113 lb 14.4 oz (51.7 kg)  12/02/17 107 lb (48.5 kg)

## 2017-12-04 NOTE — Progress Notes (Signed)
Oncology Nurse Navigator Documentation  Oncology Nurse Navigator Flowsheets 12/04/2017  Navigator Location [facility]  Navigator Encounter Type Initial MedOnc;Clinic/MDC/spoke with patient and family today.  Gave and explained information on lung cancer and treatment.  Patient would like to think about treatment options and will get back with [place].    Abnormal Finding Date 11/04/2017  Confirmed Diagnosis Date 12/01/2017  Multidisiplinary Clinic Date 12/04/2017  Patient Visit Type MedOnc  Treatment Phase Pre-Tx/Tx Discussion  Barriers/Navigation Needs Education  Education Understanding Cancer/ Treatment Options;Newly Diagnosed Cancer Education  Interventions Education  Education Method Verbal;Written  Acuity Level 2  Time Spent with Patient 30

## 2017-12-04 NOTE — Progress Notes (Signed)
[facility] Telephone:([phone]) [phone]   [phone] Multidisciplinary thoracic oncology clinic CONSULT NOTE  REFERRING PHYSICIAN: Dr. Modesto Charon  REASON FOR CONSULTATION:  84 years old white female with recurrent lung cancer.  HPI Brenda Gardner is a 84 y.o. female with past medical history significant for anxiety, osteoarthritis, hemochromatosis, history of squamous cell carcinoma of the tongue status post partial glossectomy with lymph node dissection by Dr. Constance Holster in 2015.  She also has a long history for smoking but quit late March 2019.  The patient mentioned that she had a history of stage I lung cancer diagnosed in March 2017 when CT scan of the chest showed left apical nodule.  She had a PET scan in June 2014 that showed malignant anterior medial left upper lobe nodule.  She underwent stereotactic radiotherapy under the care of Dr. Sondra Come completed March 28, 2014.  She also underwent partial glossectomy with lymph node dissection for the squamous cell carcinoma of the tongue.  The patient was followed by observation and repeat imaging studies with CT scan of the chest on June 20, 2016 showed irregular posterior right apical nodule with continued interval progression measuring 1.8 x 1.4 cm.  For some reason this was followed by observation.  The patient has been complaining of increasing fatigue and cough since December 2018.  She was seen by her primary care physician and had chest x-ray on October 30, 2017 that showed masslike opacity at the right apex measuring 6.7 x 7.1 x 6.0 cm in size highly concerning for neoplasm with suspected right hilar adenopathy.  CT scan of the chest was performed on November 04, 2017 and that showed right upper lobe mass measuring 5.4 x 6.8 and has long border of pleural contact associated with peribronchial thickening extending to the right hilum with patchy groundglass in the posterior segment right upper lobe.  There was also  mediastinal adenopathy measuring up to 3.2 cm in short axis and the low right paratracheal station, right hilar adenopathy measuring approximately 2.5 cm in short axis.  The scan also showed small to moderate right pleural effusion.  She underwent ultrasound-guided right thoracentesis with drainage of 900 cc of right pleural fluid and the final cytology was negative for malignancy. MRI of the brain on November 25, 2017 showed no intracranial metastatic disease.  The patient was referred to Dr. Roxan Hockey and she had a PET scan performed on November 28, 2017 and that showed 7.6 cm posterior right upper lobe mass compatible with primary bronchogenic neoplasm.  There was associated mediastinal and right hilar nodal metastasis and a small to moderate bilateral pleural effusion right greater than left.  There was also suspected 1.0 cm left adrenal metastasis still indetermine.  The scan also showed 0.9 cm soft tissue nodule beneath the anterior abdominal wall that is nonspecific.  On December 01, 2017 the patient underwent CT-guided core biopsy of the large right upper lobe lung mass by interventional radiology. The final pathology ([id number]) was consistent with non-small cell carcinoma, poorly differentiated with abundant background necrosis.  The tumor cells were positive for cytokeratin AE1/AE3 and focally positive for cytokeratin 5/6.  They are essentially negative for p63, Melan-A, Napsin-A, TTF-1.  This immunohistochemical profile was not a specific but there are very focal features suggestive squamous differentiation. The patient was referred to me today for evaluation and recommendation regarding treatment of her condition. When seen today she is feeling fine except for the lack of appetite.  She had several  episodes of diarrhea recently and she lost some weight.  She has no significant chest pain but continues to have shortness of breath at baseline increased with exertion with no cough or hemoptysis.  She denied  having any headache or visual change.  She has no nausea, vomiting or constipation.  She has no fever or chills. Family history significant for father with mesothelioma, mother died at age 37 and brother died from alcoholic complication. The patient is a widow and has 5 children.  She was accompanied today by her son came and daughter [name].  She used to work as a Network engineer.  She has a history for smoking 1 pack/day for around 69 years and she quit in March 2019.  She drinks 1-2 alcoholic drinks every night and no history of drug abuse.   HPI  Past Medical History:  Diagnosis Date  . Anxiety    SINCE THIS IS COMING UP  . Arthritis   . Cancer ([place])   . Cataract   . Complication of anesthesia   . Hemochromatosis   . Hypertension   . Lesion of left lung    ONLY DOING RADIATION  . PONV (postoperative nausea and vomiting)    56 YRS AGO DURING CHILDBIRTH  . Radiation 03/15/14-03/28/14   left apical nodule 50 gray    Past Surgical History:  Procedure Laterality Date  . APPENDECTOMY    . EYE SURGERY     BIL CATARACTS  . GLOSSECTOMY Left 01/07/2014   Procedure: PARTIAL GLOSSECTOMY;  Surgeon: [name], MD;  Location: [facility];  Service: ENT;  Laterality: Left;  . IR THORACENTESIS ASP PLEURAL SPACE W/IMG GUIDE  11/25/2017  . RADICAL NECK DISSECTION Left 01/07/2014   Procedure: LEFT  NECK DISSECTION WITH FROZEN SECTION;  Surgeon: [name], MD;  Location: [facility];  Service: ENT;  Laterality: Left;  . TONSILLECTOMY    . TUBAL LIGATION    . VEIN LIGATION Bilateral 1980's    Family History  Problem Relation Age of Onset  . Mesothelioma Father     Social History Social History   Tobacco Use  . Smoking status: Current Every Day Smoker    Packs/day: 0.50    Years: 25.00    Pack years: 12.50    Types: Cigarettes  . Smokeless tobacco: Never Used  Substance Use Topics  . Alcohol use: Yes    Alcohol/week: 3.5 oz    Types: 7 Standard drinks or equivalent per week  . Drug use: No     Allergies  Allergen Reactions  . Lisinopril Swelling    Swelling of tongue    Current Outpatient Medications  Medication Sig Dispense Refill  . acetaminophen (TYLENOL) 325 MG tablet Take 650 mg by mouth every 6 (six) hours as needed for mild pain or moderate pain.     Marland Kitchen amoxicillin-clavulanate (AUGMENTIN) 875-125 MG tablet Take 1 tablet by mouth 2 (two) times daily for 14 days. 28 tablet 0  . benzonatate (TESSALON) 200 MG capsule Take 1 capsule (200 mg total) by mouth 3 (three) times daily as needed for cough. 90 capsule 0  . collagenase (SANTYL) ointment Apply topically daily. 15 g 0  . ENSURE PLUS (ENSURE PLUS) LIQD Take 237 mLs by mouth daily.    Marland Kitchen ibuprofen (ADVIL,MOTRIN) 100 MG chewable tablet Chew 200 mg by mouth every 8 (eight) hours as needed for mild pain.      No current facility-administered medications for this visit.     Review of Systems  Constitutional:  positive for anorexia, fatigue and weight loss Eyes: negative Ears, nose, mouth, throat, and face: negative Respiratory: positive for dyspnea on exertion Cardiovascular: negative Gastrointestinal: positive for diarrhea Genitourinary:negative Integument/breast: negative Hematologic/lymphatic: negative Musculoskeletal:negative Neurological: negative Behavioral/Psych: negative Endocrine: negative Allergic/Immunologic: negative  Physical Exam  TFT:DDUKG, healthy, no distress, anxious, ill looking and malnourished SKIN: skin color, texture, turgor are normal, no rashes or significant lesions HEAD: Normocephalic, No masses, lesions, tenderness or abnormalities EYES: normal, PERRLA, Conjunctiva are pink and non-injected EARS: External ears normal, Canals clear OROPHARYNX:no exudate, no erythema and lips, buccal mucosa, and tongue normal  NECK: supple, no adenopathy, no JVD LYMPH:  no palpable lymphadenopathy, no hepatosplenomegaly BREAST:not examined LUNGS: decreased breath sounds, scattered rales  bilaterally HEART: regular rate & rhythm, no murmurs and no gallops ABDOMEN:abdomen soft, non-tender, normal bowel sounds and no masses or organomegaly BACK: Back symmetric, no curvature., Range of motion is normal EXTREMITIES:no joint deformities, effusion, or inflammation, no edema  NEURO: alert & oriented x 3 with fluent speech, no focal motor/sensory deficits  PERFORMANCE STATUS: ECOG 2  LABORATORY DATA: Lab Results  Component Value Date   WBC 11.7 (H) 12/04/2017   HGB 11.8 (L) 12/01/2017   HCT 32.5 (L) 12/04/2017   MCV 90.0 12/04/2017   PLT 333 12/04/2017      Chemistry      Component Value Date/Time   NA 131 (L) 11/26/2017 0338   NA 136 10/30/2017 1636   NA 139 03/11/2014 1500   K 3.6 11/26/2017 0338   K 3.6 03/11/2014 1500   CL 101 11/26/2017 0338   CL 103 02/05/2013 1136   CO2 21 (L) 11/26/2017 0338   CO2 22 03/11/2014 1500   BUN 15 11/26/2017 0338   BUN 11 10/30/2017 1636   BUN 9.7 03/11/2014 1500   CREATININE 0.83 11/26/2017 0338   CREATININE 0.7 03/11/2014 1500      Component Value Date/Time   CALCIUM 8.2 (L) 11/26/2017 0338   CALCIUM 9.4 03/11/2014 1500   ALKPHOS 69 11/26/2017 0338   ALKPHOS 82 03/11/2014 1500   AST 20 11/26/2017 0338   AST 30 03/11/2014 1500   ALT 9 (L) 11/26/2017 0338   ALT 11 03/11/2014 1500   BILITOT 0.8 11/26/2017 0338   BILITOT 0.4 10/30/2017 1636   BILITOT 0.73 03/11/2014 1500       RADIOGRAPHIC STUDIES: Dg Chest 1 View  Result Date: 11/25/2017 CLINICAL DATA:  Status post thoracentesis. EXAM: CHEST  1 VIEW COMPARISON:  Chest x-ray 11/26/1998 19. FINDINGS: Mediastinum and right hilar prominence again noted consistent adenopathy. Right apical mass again noted. COPD. Stable left pleural effusion. Small right pleural effusion. No evidence of pneumothorax post thoracentesis. Stable cardiomegaly. IMPRESSION: No evidence of pneumothorax post thoracentesis Electronically Signed   By: [place]   On: 11/25/2017 12:33   X-ray  Chest Pa And Lateral  Result Date: 11/25/2017 CLINICAL DATA:  Hypoxia EXAM: CHEST - 2 VIEW COMPARISON:  11/24/2017.  CT 11/04/2017. FINDINGS: Mediastinal and right hilar prominence again noted consistent adenopathy. Right upper lung mass again noted. Cardiomegaly with mild bilateral interstitial prominence and bilateral pleural effusions. No pneumothorax. No acute bony abnormality. IMPRESSION: 1. Mediastinal right hilar prominence again noted consistent adenopathy. Right upper lung mass again noted without interim change. 2. Cardiomegaly with mild bilateral interstitial prominence and bilateral pleural effusions suggesting mild CHF. Electronically Signed   By: Marcello Moores  Register   On: 11/25/2017 09:15   Dg Chest 2 View  Result Date: 11/24/2017 CLINICAL DATA:  Shortness of  breath and cough EXAM: CHEST - 2 VIEW COMPARISON:  10/30/2017 plain film, 11/04/2017 CT of the chest FINDINGS: Known right upper lobe mass lesion is again identified. Right hilar adenopathy is noted. Right pleural effusion is noted which appears larger than that seen on recent CT. New small left pleural effusion is noted. The remainder of the left lung is clear. Cardiac shadow is within normal limits. IMPRESSION: Known right upper lobe lung mass with associated effusion. New left pleural effusion is noted. Electronically Signed   By: Inez Catalina M.D.   On: 11/24/2017 13:17   Ct Chest Wo Contrast  Result Date: 11/05/2017 CLINICAL DATA:  Head neck cancer, worsening cough and shortness of breath. EXAM: CT CHEST WITHOUT CONTRAST TECHNIQUE: Multidetector CT imaging of the chest was performed following the standard protocol without IV contrast. COMPARISON:  Chest radiograph 10/30/2017 and CT chest 06/20/2016. FINDINGS: Cardiovascular: Atherosclerotic calcification of the arterial vasculature, including coronary arteries and aortic valve. Heart is at the upper limits of normal in size. No pericardial effusion. Mediastinum/Nodes: Mediastinal  adenopathy measures up to 3.2 cm in short axis in the low right paratracheal station. Right hilar adenopathy measures approximately 2.5 cm in short axis. No axillary adenopathy. Esophagus is grossly unremarkable. Lungs/Pleura: Right upper lobe mass measures 5.4 x 6.8 cm and has a long border of pleural contact. Associated peribronchial thickening extending to the right hilum with patchy ground-glass in the posterior segment right upper lobe. Moderate centrilobular emphysema. Small to moderate right pleural effusion. Subpleural thickening and focal bullous disease in the medial aspect of the left upper lobe, unchanged. Probable subpleural atelectasis in the left lower lobe (image 121). Extrinsic compression on the right mainstem bronchus, bronchus intermedius and right upper and right middle lobe bronchi. Airway is otherwise unremarkable. Upper Abdomen: Liver margin is irregular. Visualized portions of the liver, adrenal glands and right kidney are unremarkable. Left renal stone versus renal vascular calcification. Visualized portions of the spleen, pancreas, stomach and bowel are grossly unremarkable. No upper abdominal adenopathy. Musculoskeletal: Degenerative changes in the spine. Large area of partially imaged lucency in the right humeral head is unchanged. No worrisome lytic or sclerotic lesions. Mild compression of the inferior endplate of L2 is unchanged. IMPRESSION: 1. Right upper lobe mass with postobstructive pneumonitis in the right upper lobe and bulky right hilar/ipsilateral mediastinal adenopathy, most consistent with primary bronchogenic carcinoma and at least stage IIIA disease. If the small to moderate right pleural effusion is malignant, then findings are consistent with stage IV disease. 2. Aortic atherosclerosis (ICD10-170.0). Coronary artery calcification. 3.  Emphysema (ICD10-J43.9). 4. Cirrhosis. 5. Left renal stone versus vascular calcification. Electronically Signed   By: Lorin Picket M.D.    On: 11/05/2017 08:05   Ct Angio Chest Pe W Or Wo Contrast  Result Date: 11/25/2017 CLINICAL DATA:  Right upper lobe lung mass. Right thoracentesis earlier today. Current smoker. COPD. Dyspnea. EXAM: CT ANGIOGRAPHY CHEST WITH CONTRAST TECHNIQUE: Multidetector CT imaging of the chest was performed using the standard protocol during bolus administration of intravenous contrast. Multiplanar CT image reconstructions and MIPs were obtained to evaluate the vascular anatomy. CONTRAST:  166mL ISOVUE-370 IOPAMIDOL (ISOVUE-370) INJECTION 76% COMPARISON:  11/04/2017 chest CT. FINDINGS: Cardiovascular: The study is high quality for the evaluation of pulmonary embolism. There are no filling defects in the central, lobar, segmental or subsegmental pulmonary artery branches to suggest acute pulmonary embolism. Atherosclerotic nonaneurysmal thoracic aorta. Top-normal caliber main pulmonary artery (3.0 cm diameter). Extrinsic near occlusion of the right upper lobe pulmonary  arteries due to right hilar adenopathy. Normal heart size. Moderate pericardial effusion, increased. Three-vessel coronary atherosclerosis. Mediastinum/Nodes: No discrete thyroid nodules. Unremarkable esophagus. No axillary adenopathy. Bulky 3.8 cm right paratracheal node (series 6/image 57) increased from 3.6 cm on 11/04/2017. Newly mildly enlarged 1.1 cm subcarinal node (series 6/image 73). Bulky 3.4 cm right hilar node (series 6/image 67), stable. No left hilar adenopathy. Lungs/Pleura: No pneumothorax. Small dependent right pleural effusion, slightly decreased. New small dependent left pleural effusion. Moderate centrilobular emphysema with diffuse bronchial wall thickening. Mild dependent atelectasis in both lower lobes. Posterior right upper lobe 6.9 x 5.2 cm lung mass (series 7/image 29), previously 6.8 x 5.4 cm, stable. Stable minimal sharply marginated paramediastinal consolidation in apical left upper lobe (series 7/image 18), compatible with mild  radiation fibrosis. No new significant pulmonary nodules. Upper abdomen: Probable nonobstructing 4 mm interpolar left renal stone. Musculoskeletal: No aggressive appearing focal osseous lesions. Moderate thoracic spondylosis. Review of the MIP images confirms the above findings. IMPRESSION: 1. No pulmonary embolism. 2. Moderate pericardial effusion, increased. 3. Small dependent bilateral pleural effusions, slightly decreased on the right and new on the left. 4. Stable 6.9 cm posterior right upper lobe lung mass compatible with primary bronchogenic carcinoma. 5. Stable bulky right hilar adenopathy. Increased bulky right paratracheal adenopathy. New mild subcarinal adenopathy. Findings are most compatible with nodal metastatic disease. 6. Three-vessel coronary atherosclerosis. Aortic Atherosclerosis (ICD10-I70.0) and Emphysema (ICD10-J43.9). Electronically Signed   By: Ilona Sorrel M.D.   On: 11/25/2017 17:45   Mr Jeri Cos PO Contrast  Result Date: 11/25/2017 CLINICAL DATA:  84 y/o  F; history of lung cancer, for staging. EXAM: MRI HEAD WITHOUT AND WITH CONTRAST TECHNIQUE: Multiplanar, multiecho pulse sequences of the brain and surrounding structures were obtained without and with intravenous contrast. CONTRAST:  39mL MULTIHANCE GADOBENATE DIMEGLUMINE 529 MG/ML IV SOLN COMPARISON:  07/27/2009 CT head. FINDINGS: Brain: No acute infarction, hemorrhage, hydrocephalus, extra-axial collection or mass lesion. Patchy confluentnonspecific foci of T2 FLAIR hyperintense signal abnormality in subcortical and periventricular white matter are compatible withseverechronic microvascular ischemic changes for age. Advancedbrain parenchymal volume loss. No abnormal enhancement. Several punctate foci of susceptibility hypointensity present within the pons and right greater than left posterior hemispheres compatible hemosiderin deposition of chronic microhemorrhage. Small chronic lacunar infarct within the pons. Vascular: Normal flow  voids. Skull and upper cervical spine: Normal marrow signal. Sinuses/Orbits: Negative. Other: Bilateral intra-ocular lens replacement. IMPRESSION: 1. No intracranial metastatic disease identified. 2. Severe chronic microvascular ischemic changes and age advanced parenchymal volume loss of the brain. Electronically Signed   By: Kristine Garbe M.D.   On: 11/25/2017 22:09   Nm Pet Image Initial (pi) Skull Base To Thigh  Result Date: 11/28/2017 CLINICAL DATA:  Initial treatment strategy for right lung mass. EXAM: NUCLEAR MEDICINE PET SKULL BASE TO THIGH TECHNIQUE: 5.6 mCi F-18 FDG was injected intravenously. Full-ring PET imaging was performed from the skull base to thigh after the radiotracer. CT data was obtained and used for attenuation correction and anatomic localization. Fasting blood glucose: 98 mg/dl COMPARISON:  CTA chest dated 11/25/2017.  PET-CT dated 12/09/2013. FINDINGS: Mediastinal blood pool activity: SUV max 1.8 NECK: No hypermetabolic cervical lymphadenopathy. Incidental CT findings: none CHEST: 5.3 x 7.6 cm posterior right upper lobe mass (series 8/image 14), max SUV 11.7 with suspected central necrosis, compatible with primary bronchogenic neoplasm. Dominant 3.9 cm right paratracheal node (series 4/image 71), max SUV 13.9 with suspected central necrosis. 2.6 cm short axis right hilar node, max SUV 17.4. 10  mm short axis subcarinal node, max SUV 5.4. Small to moderate bilateral pleural effusions, right greater than left. Incidental CT findings: Ectasia of the ascending thoracic aorta, measuring 3.8 cm. Atherosclerotic calcifications of the aortic arch. Three vessel coronary atherosclerosis. Trace inferior pericardial fluid. ABDOMEN/PELVIS: No hypermetabolic abdominopelvic lymphadenopathy. 9 mm soft tissue nodule beneath the anterior abdominal wall (series 4/image 119), nonspecific but without appreciable hypermetabolism. Focal hypermetabolism beneath the left mid abdominal wall on PET  image 127 is without CT correlate. No abnormal hypermetabolism in the liver, spleen, pancreas, or right adrenal gland. Possible 10 mm left adrenal nodule (series 4/image 113), equivocal. New hypermetabolism involving the left adrenal gland, max SUV 2.6. This combination of findings is concerning for early adrenal metastasis. Nodular hepatic contour, including an exophytic nodular lesion along the inferior aspect of the right hepatic lobe (series 4/image 130), chronic. No appreciable hypermetabolism. Focal hypermetabolism in the right mid/lower abdomen on PET image 141 is without CT correlate. Incidental CT findings: Atherosclerotic calcifications of the abdominal aorta and branch vessels. Sigmoid diverticulosis, without evidence of diverticulitis. SKELETON: No focal hypermetabolic activity to suggest skeletal metastasis. Incidental CT findings: Mild sclerosis involving the bilateral femoral heads. IMPRESSION: 7.6 cm posterior right upper lobe mass, compatible with primary bronchogenic neoplasm. Associated mediastinal and right hilar nodal metastases. Small to moderate bilateral pleural effusions, right greater than left. Suspected 10 mm left adrenal metastasis, although indeterminate. 9 mm soft tissue nodule beneath the anterior abdominal wall, nonspecific but without appreciable hypermetabolism. Attention on follow-up is suggested to exclude peritoneal disease. Electronically Signed   By: Julian Hy M.D.   On: 11/28/2017 13:51   Ct Biopsy  Result Date: 12/01/2017 CLINICAL DATA:  Large posterior right upper lobe lung mass. EXAM: CT GUIDED CORE BIOPSY OF RIGHT LUNG MASS ANESTHESIA/SEDATION: 0.5 mg IV Versed; 50 mcg IV Fentanyl Total Moderate Sedation Time:  20 minutes. The patient's level of consciousness and physiologic status were continuously monitored during the procedure by Radiology nursing. PROCEDURE: The procedure risks, benefits, and alternatives were explained to the patient. Questions regarding  the procedure were encouraged and answered. The patient understands and consents to the procedure. The posterior right chest wall was prepped with chlorhexidine in a sterile fashion, and a sterile drape was applied covering the operative field. A sterile gown and sterile gloves were used for the procedure. Local anesthesia was provided with 1% Lidocaine. Initial CT was performed in a decubitus position with the right side down. After localizing a site along the posterior right chest wall, a 17 gauge needle was advanced into a right upper lobe lung mass. Coaxial 18 gauge core biopsy samples were obtained. Three total core biopsy samples were obtained. After the procedure, the Biosentry device was utilized in depositing a plug at the pleural entry site. Additional CT was then performed. COMPLICATIONS: None FINDINGS: Large mass again localizes to the posterior right upper lobe and measures nearly 8 cm in maximal diameter. Solid tissue was obtained. Post biopsy imaging shows no evidence of pneumothorax or surrounding hemorrhage. IMPRESSION: CT-guided core biopsy performed of a large right upper lobe lung mass measuring nearly 8 cm. Electronically Signed   By: Aletta Edouard M.D.   On: 12/01/2017 16:22   Ir Thoracentesis Asp Pleural Space W/img Guide  Result Date: 11/25/2017 INDICATION: Patient with bilateral pleural effusions. Request is made for diagnostic and therapeutic thoracentesis. EXAM: ULTRASOUND GUIDED DIAGNOSTIC AND THERAPEUTIC RIGHT THORACENTESIS MEDICATIONS: 10 mL 2% lidocaine COMPLICATIONS: None immediate. PROCEDURE: An ultrasound guided thoracentesis was thoroughly discussed with  the patient and questions answered. The benefits, risks, alternatives and complications were also discussed. The patient understands and wishes to proceed with the procedure. Written consent was obtained. Ultrasound was performed to localize and mark an adequate pocket of fluid in the right chest. The area was then prepped  and draped in the normal sterile fashion. 2% lidocaine was used for local anesthesia. Under ultrasound guidance a Safe-T-Centesis catheter was introduced. Thoracentesis was performed. The catheter was removed and a dressing applied. FINDINGS: A total of approximately 900 mL of amber fluid was removed. Samples were sent to the laboratory as requested by the clinical team. IMPRESSION: Successful ultrasound guided diagnostic and therapeutic right thoracentesis yielding 900 mL of pleural fluid. Read by: Brynda Greathouse PA-C Electronically Signed   By: Aletta Edouard M.D.   On: 11/25/2017 12:19    ASSESSMENT: This is a very pleasant 84 years old white female with at least stage IIIb (T4, N2, M0/M1a) non-small cell lung cancer, favoring squamous cell carcinoma diagnosed in March 2019.  The patient also has a history of squamous cell carcinoma of the tongue status post partial glossectomy with lymph node dissection in 2015 as well as history of stage Ia non-small cell lung cancer status post stereotactic radiotherapy completed in July 2015.  She presented with large mass in the right upper lobe in addition to mediastinal lymphadenopathy and suspicious malignant pleural effusion.  PLAN: I had a lengthy discussion with the patient and her family today about her current disease of stage, prognosis and treatment options. I personally and independently reviewed the scan images and discussed the results and showed the images to the patient and her son and daughter today. I discussed with them several options for management of her condition including palliative care and hospice referral versus consideration of a course of concurrent chemoradiation with weekly carboplatin for AUC of 2 and paclitaxel 45 mg/M2 followed by restaging scan and consideration of immunotherapy if no evidence for disease progression versus just palliative radiotherapy to the right upper lobe lung mass and mediastinum for comfort care and to prevent  further respiratory compromise from the tumor. I discussed with the patient and her family the adverse effect of these treatments including but not limited to alopecia, myelosuppression, nausea and vomiting, peripheral neuropathy as well as esophagitis from radiation. She is a scheduled to see Dr. Sondra Come on December 08, 2017 for reevaluation and more discussion of her treatment options. The patient and her family were unable to reach a decision regarding treatment today and would like to wait a little bit more to make a decision. If she decided to proceed with a course of concurrent chemoradiation, I could consider starting the chemotherapy on December 14, 2017. We will arrange the follow-up visit as well as treatment plan based on the patient is final decision. She was advised to call immediately if she has any concerning symptoms in the interval. The patient voices understanding of current disease status and treatment options and is in agreement with the current care plan.  All questions were answered. The patient knows to call the clinic with any problems, questions or concerns. We can certainly see the patient much sooner if necessary.  Thank you so much for allowing me to participate in the care of [place]. I will continue to follow up the patient with you and assist in her care.  I spent 55 minutes counseling the patient face to face. The total time spent in the appointment was 80 minutes.  Disclaimer: This note was dictated with voice recognition software. Similar sounding words can inadvertently be transcribed and may not be corrected upon review.   Eilleen Kempf December 04, 2017, 3:49 PM

## 2017-12-06 ENCOUNTER — Encounter: Payer: Self-pay | Admitting: Internal Medicine

## 2017-12-08 ENCOUNTER — Other Ambulatory Visit: Payer: Self-pay

## 2017-12-08 ENCOUNTER — Encounter: Payer: Self-pay | Admitting: Radiation Oncology

## 2017-12-08 ENCOUNTER — Ambulatory Visit
Admission: RE | Admit: 2017-12-08 | Discharge: 2017-12-08 | Disposition: A | Discharge status: Home / Self Care | Payer: Medicare Other | Source: Ambulatory Visit | Attending: Radiation Oncology | Admitting: Radiation Oncology

## 2017-12-08 VITALS — BP 126/53 | HR 72 | Temp 97.9°F | Resp 24 | Wt 112.5 lb

## 2017-12-08 DIAGNOSIS — Z79899 Other long term (current) drug therapy: Secondary | ICD-10-CM | POA: Insufficient documentation

## 2017-12-08 DIAGNOSIS — I313 Pericardial effusion (noninflammatory): Secondary | ICD-10-CM | POA: Diagnosis not present

## 2017-12-08 DIAGNOSIS — I10 Essential (primary) hypertension: Secondary | ICD-10-CM | POA: Insufficient documentation

## 2017-12-08 DIAGNOSIS — J439 Emphysema, unspecified: Secondary | ICD-10-CM | POA: Insufficient documentation

## 2017-12-08 DIAGNOSIS — Z85118 Personal history of other malignant neoplasm of bronchus and lung: Secondary | ICD-10-CM | POA: Insufficient documentation

## 2017-12-08 DIAGNOSIS — C029 Malignant neoplasm of tongue, unspecified: Secondary | ICD-10-CM

## 2017-12-08 DIAGNOSIS — F17210 Nicotine dependence, cigarettes, uncomplicated: Secondary | ICD-10-CM | POA: Diagnosis not present

## 2017-12-08 DIAGNOSIS — R599 Enlarged lymph nodes, unspecified: Secondary | ICD-10-CM | POA: Diagnosis not present

## 2017-12-08 DIAGNOSIS — Z808 Family history of malignant neoplasm of other organs or systems: Secondary | ICD-10-CM | POA: Diagnosis not present

## 2017-12-08 DIAGNOSIS — I517 Cardiomegaly: Secondary | ICD-10-CM | POA: Diagnosis not present

## 2017-12-08 DIAGNOSIS — I700 Atherosclerosis of aorta: Secondary | ICD-10-CM | POA: Diagnosis not present

## 2017-12-08 DIAGNOSIS — Z923 Personal history of irradiation: Secondary | ICD-10-CM | POA: Diagnosis not present

## 2017-12-08 DIAGNOSIS — Z9119 Patient's noncompliance with other medical treatment and regimen: Secondary | ICD-10-CM | POA: Insufficient documentation

## 2017-12-08 DIAGNOSIS — F419 Anxiety disorder, unspecified: Secondary | ICD-10-CM | POA: Diagnosis not present

## 2017-12-08 DIAGNOSIS — K5730 Diverticulosis of large intestine without perforation or abscess without bleeding: Secondary | ICD-10-CM | POA: Insufficient documentation

## 2017-12-08 DIAGNOSIS — M129 Arthropathy, unspecified: Secondary | ICD-10-CM | POA: Insufficient documentation

## 2017-12-08 DIAGNOSIS — Z85810 Personal history of malignant neoplasm of tongue: Secondary | ICD-10-CM | POA: Diagnosis not present

## 2017-12-08 DIAGNOSIS — R590 Localized enlarged lymph nodes: Secondary | ICD-10-CM | POA: Insufficient documentation

## 2017-12-08 DIAGNOSIS — R911 Solitary pulmonary nodule: Secondary | ICD-10-CM

## 2017-12-08 DIAGNOSIS — I4891 Unspecified atrial fibrillation: Secondary | ICD-10-CM | POA: Insufficient documentation

## 2017-12-08 DIAGNOSIS — J90 Pleural effusion, not elsewhere classified: Secondary | ICD-10-CM | POA: Insufficient documentation

## 2017-12-08 DIAGNOSIS — C3411 Malignant neoplasm of upper lobe, right bronchus or lung: Secondary | ICD-10-CM | POA: Diagnosis not present

## 2017-12-08 DIAGNOSIS — I6381 Other cerebral infarction due to occlusion or stenosis of small artery: Secondary | ICD-10-CM | POA: Diagnosis not present

## 2017-12-08 DIAGNOSIS — E279 Disorder of adrenal gland, unspecified: Secondary | ICD-10-CM | POA: Diagnosis not present

## 2017-12-08 NOTE — Progress Notes (Signed)
Radiation Oncology         [phone] ________________________________  Initial Outpatient Consultation  Name: Brenda Gardner MRN: [id number]  Date: 12/08/2017  DOB: 1933/06/12  SE:GBTDVVO, No Pcp Per  Curt Bears, MD   REFERRING PHYSICIAN: Curt Bears, MD  DIAGNOSIS: At least stage IIIb (T4, N2, M0/M1a) non-small cell lung cancer, favoring squamous cell carcinoma presenting in the right lung  HISTORY OF PRESENT ILLNESS::Brenda Gardner is a 84 y.o. female who presents with non-small cell lung cancer. The patient was originally found to have stage I lung cancer in June 2014 when a PET scan showed a malignant anterior medial left upper lobe nodules. She underwent stereotactic radiotherapy completed on 03/26/14. She was followed with repeat imaging studies. CT of the chest on 06/20/16 showed irregular posterior right apical nodule which showed  interval progression measuring 1.8 x 1.4 cm. The patient was then scheduled for a PET scan at that time but never showed up for this scan and did not return calls concerning this scan or return for follow-up in radiation oncology. The patient and family report being non-compliant with follow-ups since 2017 partly related to her sick husband who has since passed. She has not followed up concerning her tongue cancer with ENT.  The patient had presented to her PCP, Dr. Pamella Pert, on 10/30/17 with a two month history of worsening cough and shortness of breath. X-ray of the chest on 10/30/17 showed COPD changes with mass like opacity at the right apex 6.7 x 7.1 x 6.0 cm in size highly concerning for neoplasm. Suspected right hilar adenopathy noted. Subsequent CT of the chest on 11/04/17 showed a right upper lobe mass with postobstructive pneumonitis in the right upper lobe and bulky right hilar/ipsilateral mediastinal adenopathy. This is most consistent with primary bronchogenic carcinoma and at least stage IIIA disease. If the small to moderate right  pleural effusion is malignant, then findings would be consistent with stage IV disease. She was admitted to the ED on 11/24/17 with progressive dyspnea and acute hypoxic respiratory failure. She was treated for postobstructive pneumonia and also diagnosed with atrial fibrillation. She was discharged with home oxygen. MRI of the brain on 11/25/17 showed no intracranial metastatic disease. PET scan on 11/28/17 showed a 7.6 cm posterior right upper lobe mass compatible with primary bronchogenic neoplasm with associated mediastinal and right hilar nodal metastases. There were small to moderate bilateral pleural effusions, right greater than left. Suspected 10 mm left adrenal metastasis, indeterminate. 9 mm soft tissue nodule beneath the anterior abdominal wall, nonspecific but without appreciable hypermetabolism. The patient underwent biopsy of the right upper lobe on 12/01/17. This revealed non-small cell carcinoma. The patient presents today with her son and daughter. The patient has been referred on behalf of Dr. Julien Nordmann for evaluation of concurrent chemoradiation or palliative radiation.   Of note the patient has a history of squamous cell carcinoma of the tongue, status post partial glossectomy with lymph node dissection by Dr. Constance Holster.  The patient reports shortness of breath with activity. She reports edema in bilateral legs. She notes she had edema in bilateral arms last week that resolved. She also notes an skin tear on her sacrum that causes her pain. The patient's daughter reports increasing weakness and difficulty standing. The patient's daughter also reports poor diet. She denies pain associated with her lungs. She also denies weight changes or hemoptysis.  PREVIOUS RADIATION THERAPY: Yes 03/15/14-03/26/14: 50 Gy to the left apical nodule (SBRT)  PAST MEDICAL HISTORY:  has  a past medical history of Anxiety, Arthritis, Cancer ([place]), Cataract, Complication of anesthesia, Hemochromatosis, Hypertension,  Lesion of left lung, PONV (postoperative nausea and vomiting), and Radiation (03/15/14-03/28/14).    PAST SURGICAL HISTORY: Past Surgical History:  Procedure Laterality Date  . APPENDECTOMY    . EYE SURGERY     BIL CATARACTS  . GLOSSECTOMY Left 01/07/2014   Procedure: PARTIAL GLOSSECTOMY;  Surgeon: [name], MD;  Location: [facility];  Service: ENT;  Laterality: Left;  . IR THORACENTESIS ASP PLEURAL SPACE W/IMG GUIDE  11/25/2017  . RADICAL NECK DISSECTION Left 01/07/2014   Procedure: LEFT  NECK DISSECTION WITH FROZEN SECTION;  Surgeon: [name], MD;  Location: [facility];  Service: ENT;  Laterality: Left;  . TONSILLECTOMY    . TUBAL LIGATION    . VEIN LIGATION Bilateral 1980's    FAMILY HISTORY: family history includes Mesothelioma in her father.  SOCIAL HISTORY:  reports that she has been smoking cigarettes.  She has a 12.50 pack-year smoking history. She has never used smokeless tobacco. She reports that she drinks about 3.5 oz of alcohol per week. She reports that she does not use drugs. The patient does not participate in cooking or cleaning. She ambulates throughout the house with the assistance of her daughter. Her daughter reports she has a very sedentary lifestyle and sits for the majority of day.  ALLERGIES: Lisinopril  MEDICATIONS:  Current Outpatient Medications  Medication Sig Dispense Refill  . amoxicillin-clavulanate (AUGMENTIN) 875-125 MG tablet Take 1 tablet by mouth 2 (two) times daily for 14 days. 28 tablet 0  . benzonatate (TESSALON) 200 MG capsule Take 1 capsule (200 mg total) by mouth 3 (three) times daily as needed for cough. 90 capsule 0  . collagenase (SANTYL) ointment Apply topically daily. 15 g 0  . ENSURE PLUS (ENSURE PLUS) LIQD Take 237 mLs by mouth daily.    . naproxen (NAPROSYN) 250 MG tablet Take 250 mg by mouth daily as needed.     No current facility-administered medications for this encounter.     REVIEW OF SYSTEMS: A 10+ POINT REVIEW OF SYSTEMS WAS OBTAINED  including neurology, dermatology, psychiatry, cardiac, respiratory, lymph, extremities, GI, GU, musculoskeletal, constitutional, reproductive, HEENT. All pertinent positives are noted in the HPI. All others are negative.   PHYSICAL EXAM:  weight is 112 lb 8 oz (51 kg). Her oral temperature is 97.9 F (36.6 C). Her blood pressure is 126/53 (abnormal) and her pulse is 72. Her respiration is 24 (abnormal) and oxygen saturation is 95%.   General: Alert and oriented, in no acute distress. Patient presents in a wheelchair and remained in wheelchair for entire exam. Nasal cannula in place at 3L/min. HEENT: Head is normocephalic. Extraocular movements are intact. Oropharynx is clear. Oral cavity no palpable or visible signs of recurrence along tongue.. Dentures noted to the top and bottom. Bottom dentures were removed for the examination. Neck: Neck is supple, no palpable cervical or supraclavicular lymphadenopathy. Heart: Regular in rate and rhythm with no murmurs, rubs, or gallops. Chest: Clear to auscultation bilaterally, with no rhonchi, wheezes, or rales. Abdomen: Soft, nontender, nondistended, with no rigidity or guarding. Extremities: No cyanosis. Pitting edema in both lower extremities.  Lymphatics: see Neck Exam Skin: No concerning lesions. Neurologic: Cranial nerves II through XII are grossly intact. No obvious focalities. Speech is fluent. Coordination is intact. Psychiatric: Judgment and insight are intact. Affect is appropriate.    ECOG = 3  0 - Asymptomatic (Fully active, able to carry on all  predisease activities without restriction)  1 - Symptomatic but completely ambulatory (Restricted in physically strenuous activity but ambulatory and able to carry out work of a light or sedentary nature. For example, light housework, office work)  2 - Symptomatic, <50% in bed during the day (Ambulatory and capable of all self care but unable to carry out any work activities. Up and about more than  50% of waking hours)  3 - Symptomatic, >50% in bed, but not bedbound (Capable of only limited self-care, confined to bed or chair 50% or more of waking hours)  4 - Bedbound (Completely disabled. Cannot carry on any self-care. Totally confined to bed or chair)  5 - Death   Eustace Pen MM, Creech RH, Tormey DC, et al. [phone]). "Toxicity and response criteria of the [facility]". [place] Oncol. 5 (6): 649-55  LABORATORY DATA:  Lab Results  Component Value Date   WBC 11.7 (H) 12/04/2017   HGB 11.8 (L) 12/01/2017   HCT 32.5 (L) 12/04/2017   MCV 90.0 12/04/2017   PLT 333 12/04/2017   NEUTROABS 8.9 (H) 12/04/2017   Lab Results  Component Value Date   NA 132 (L) 12/04/2017   K 4.2 12/04/2017   CL 99 12/04/2017   CO2 25 12/04/2017   GLUCOSE 84 12/04/2017   CREATININE 0.66 12/04/2017   CALCIUM 8.8 12/04/2017      RADIOGRAPHY: Dg Chest 1 View  Result Date: 11/25/2017 CLINICAL DATA:  Status post thoracentesis. EXAM: CHEST  1 VIEW COMPARISON:  Chest x-ray 11/26/1998 19. FINDINGS: Mediastinum and right hilar prominence again noted consistent adenopathy. Right apical mass again noted. COPD. Stable left pleural effusion. Small right pleural effusion. No evidence of pneumothorax post thoracentesis. Stable cardiomegaly. IMPRESSION: No evidence of pneumothorax post thoracentesis Electronically Signed   By: [place]   On: 11/25/2017 12:33   X-ray Chest Pa And Lateral  Result Date: 11/25/2017 CLINICAL DATA:  Hypoxia EXAM: CHEST - 2 VIEW COMPARISON:  11/24/2017.  CT 11/04/2017. FINDINGS: Mediastinal and right hilar prominence again noted consistent adenopathy. Right upper lung mass again noted. Cardiomegaly with mild bilateral interstitial prominence and bilateral pleural effusions. No pneumothorax. No acute bony abnormality. IMPRESSION: 1. Mediastinal right hilar prominence again noted consistent adenopathy. Right upper lung mass again noted without interim change. 2.  Cardiomegaly with mild bilateral interstitial prominence and bilateral pleural effusions suggesting mild CHF. Electronically Signed   By: Marcello Moores  Register   On: 11/25/2017 09:15   Dg Chest 2 View  Result Date: 11/24/2017 CLINICAL DATA:  Shortness of breath and cough EXAM: CHEST - 2 VIEW COMPARISON:  10/30/2017 plain film, 11/04/2017 CT of the chest FINDINGS: Known right upper lobe mass lesion is again identified. Right hilar adenopathy is noted. Right pleural effusion is noted which appears larger than that seen on recent CT. New small left pleural effusion is noted. The remainder of the left lung is clear. Cardiac shadow is within normal limits. IMPRESSION: Known right upper lobe lung mass with associated effusion. New left pleural effusion is noted. Electronically Signed   By: Inez Catalina M.D.   On: 11/24/2017 13:17   Ct Angio Chest Pe W Or Wo Contrast  Result Date: 11/25/2017 CLINICAL DATA:  Right upper lobe lung mass. Right thoracentesis earlier today. Current smoker. COPD. Dyspnea. EXAM: CT ANGIOGRAPHY CHEST WITH CONTRAST TECHNIQUE: Multidetector CT imaging of the chest was performed using the standard protocol during bolus administration of intravenous contrast. Multiplanar CT image reconstructions and MIPs were obtained to evaluate  the vascular anatomy. CONTRAST:  169mL ISOVUE-370 IOPAMIDOL (ISOVUE-370) INJECTION 76% COMPARISON:  11/04/2017 chest CT. FINDINGS: Cardiovascular: The study is high quality for the evaluation of pulmonary embolism. There are no filling defects in the central, lobar, segmental or subsegmental pulmonary artery branches to suggest acute pulmonary embolism. Atherosclerotic nonaneurysmal thoracic aorta. Top-normal caliber main pulmonary artery (3.0 cm diameter). Extrinsic near occlusion of the right upper lobe pulmonary arteries due to right hilar adenopathy. Normal heart size. Moderate pericardial effusion, increased. Three-vessel coronary atherosclerosis. Mediastinum/Nodes:  No discrete thyroid nodules. Unremarkable esophagus. No axillary adenopathy. Bulky 3.8 cm right paratracheal node (series 6/image 57) increased from 3.6 cm on 11/04/2017. Newly mildly enlarged 1.1 cm subcarinal node (series 6/image 73). Bulky 3.4 cm right hilar node (series 6/image 67), stable. No left hilar adenopathy. Lungs/Pleura: No pneumothorax. Small dependent right pleural effusion, slightly decreased. New small dependent left pleural effusion. Moderate centrilobular emphysema with diffuse bronchial wall thickening. Mild dependent atelectasis in both lower lobes. Posterior right upper lobe 6.9 x 5.2 cm lung mass (series 7/image 29), previously 6.8 x 5.4 cm, stable. Stable minimal sharply marginated paramediastinal consolidation in apical left upper lobe (series 7/image 18), compatible with mild radiation fibrosis. No new significant pulmonary nodules. Upper abdomen: Probable nonobstructing 4 mm interpolar left renal stone. Musculoskeletal: No aggressive appearing focal osseous lesions. Moderate thoracic spondylosis. Review of the MIP images confirms the above findings. IMPRESSION: 1. No pulmonary embolism. 2. Moderate pericardial effusion, increased. 3. Small dependent bilateral pleural effusions, slightly decreased on the right and new on the left. 4. Stable 6.9 cm posterior right upper lobe lung mass compatible with primary bronchogenic carcinoma. 5. Stable bulky right hilar adenopathy. Increased bulky right paratracheal adenopathy. New mild subcarinal adenopathy. Findings are most compatible with nodal metastatic disease. 6. Three-vessel coronary atherosclerosis. Aortic Atherosclerosis (ICD10-I70.0) and Emphysema (ICD10-J43.9). Electronically Signed   By: Ilona Sorrel M.D.   On: 11/25/2017 17:45   Mr Jeri Cos DX Contrast  Result Date: 11/25/2017 CLINICAL DATA:  84 y/o  F; history of lung cancer, for staging. EXAM: MRI HEAD WITHOUT AND WITH CONTRAST TECHNIQUE: Multiplanar, multiecho pulse sequences of  the brain and surrounding structures were obtained without and with intravenous contrast. CONTRAST:  43mL MULTIHANCE GADOBENATE DIMEGLUMINE 529 MG/ML IV SOLN COMPARISON:  07/27/2009 CT head. FINDINGS: Brain: No acute infarction, hemorrhage, hydrocephalus, extra-axial collection or mass lesion. Patchy confluentnonspecific foci of T2 FLAIR hyperintense signal abnormality in subcortical and periventricular white matter are compatible withseverechronic microvascular ischemic changes for age. Advancedbrain parenchymal volume loss. No abnormal enhancement. Several punctate foci of susceptibility hypointensity present within the pons and right greater than left posterior hemispheres compatible hemosiderin deposition of chronic microhemorrhage. Small chronic lacunar infarct within the pons. Vascular: Normal flow voids. Skull and upper cervical spine: Normal marrow signal. Sinuses/Orbits: Negative. Other: Bilateral intra-ocular lens replacement. IMPRESSION: 1. No intracranial metastatic disease identified. 2. Severe chronic microvascular ischemic changes and age advanced parenchymal volume loss of the brain. Electronically Signed   By: Kristine Garbe M.D.   On: 11/25/2017 22:09   Nm Pet Image Initial (pi) Skull Base To Thigh  Result Date: 11/28/2017 CLINICAL DATA:  Initial treatment strategy for right lung mass. EXAM: NUCLEAR MEDICINE PET SKULL BASE TO THIGH TECHNIQUE: 5.6 mCi F-18 FDG was injected intravenously. Full-ring PET imaging was performed from the skull base to thigh after the radiotracer. CT data was obtained and used for attenuation correction and anatomic localization. Fasting blood glucose: 98 mg/dl COMPARISON:  CTA chest dated 11/25/2017.  PET-CT dated  12/09/2013. FINDINGS: Mediastinal blood pool activity: SUV max 1.8 NECK: No hypermetabolic cervical lymphadenopathy. Incidental CT findings: none CHEST: 5.3 x 7.6 cm posterior right upper lobe mass (series 8/image 14), max SUV 11.7 with suspected  central necrosis, compatible with primary bronchogenic neoplasm. Dominant 3.9 cm right paratracheal node (series 4/image 71), max SUV 13.9 with suspected central necrosis. 2.6 cm short axis right hilar node, max SUV 17.4. 10 mm short axis subcarinal node, max SUV 5.4. Small to moderate bilateral pleural effusions, right greater than left. Incidental CT findings: Ectasia of the ascending thoracic aorta, measuring 3.8 cm. Atherosclerotic calcifications of the aortic arch. Three vessel coronary atherosclerosis. Trace inferior pericardial fluid. ABDOMEN/PELVIS: No hypermetabolic abdominopelvic lymphadenopathy. 9 mm soft tissue nodule beneath the anterior abdominal wall (series 4/image 119), nonspecific but without appreciable hypermetabolism. Focal hypermetabolism beneath the left mid abdominal wall on PET image 127 is without CT correlate. No abnormal hypermetabolism in the liver, spleen, pancreas, or right adrenal gland. Possible 10 mm left adrenal nodule (series 4/image 113), equivocal. New hypermetabolism involving the left adrenal gland, max SUV 2.6. This combination of findings is concerning for early adrenal metastasis. Nodular hepatic contour, including an exophytic nodular lesion along the inferior aspect of the right hepatic lobe (series 4/image 130), chronic. No appreciable hypermetabolism. Focal hypermetabolism in the right mid/lower abdomen on PET image 141 is without CT correlate. Incidental CT findings: Atherosclerotic calcifications of the abdominal aorta and branch vessels. Sigmoid diverticulosis, without evidence of diverticulitis. SKELETON: No focal hypermetabolic activity to suggest skeletal metastasis. Incidental CT findings: Mild sclerosis involving the bilateral femoral heads. IMPRESSION: 7.6 cm posterior right upper lobe mass, compatible with primary bronchogenic neoplasm. Associated mediastinal and right hilar nodal metastases. Small to moderate bilateral pleural effusions, right greater than  left. Suspected 10 mm left adrenal metastasis, although indeterminate. 9 mm soft tissue nodule beneath the anterior abdominal wall, nonspecific but without appreciable hypermetabolism. Attention on follow-up is suggested to exclude peritoneal disease. Electronically Signed   By: Julian Hy M.D.   On: 11/28/2017 13:51   Ct Biopsy  Result Date: 12/01/2017 CLINICAL DATA:  Large posterior right upper lobe lung mass. EXAM: CT GUIDED CORE BIOPSY OF RIGHT LUNG MASS ANESTHESIA/SEDATION: 0.5 mg IV Versed; 50 mcg IV Fentanyl Total Moderate Sedation Time:  20 minutes. The patient's level of consciousness and physiologic status were continuously monitored during the procedure by Radiology nursing. PROCEDURE: The procedure risks, benefits, and alternatives were explained to the patient. Questions regarding the procedure were encouraged and answered. The patient understands and consents to the procedure. The posterior right chest wall was prepped with chlorhexidine in a sterile fashion, and a sterile drape was applied covering the operative field. A sterile gown and sterile gloves were used for the procedure. Local anesthesia was provided with 1% Lidocaine. Initial CT was performed in a decubitus position with the right side down. After localizing a site along the posterior right chest wall, a 17 gauge needle was advanced into a right upper lobe lung mass. Coaxial 18 gauge core biopsy samples were obtained. Three total core biopsy samples were obtained. After the procedure, the Biosentry device was utilized in depositing a plug at the pleural entry site. Additional CT was then performed. COMPLICATIONS: None FINDINGS: Large mass again localizes to the posterior right upper lobe and measures nearly 8 cm in maximal diameter. Solid tissue was obtained. Post biopsy imaging shows no evidence of pneumothorax or surrounding hemorrhage. IMPRESSION: CT-guided core biopsy performed of a large right upper lobe lung mass  measuring  nearly 8 cm. Electronically Signed   By: Aletta Edouard M.D.   On: 12/01/2017 16:22   Ir Thoracentesis Asp Pleural Space W/img Guide  Result Date: 11/25/2017 INDICATION: Patient with bilateral pleural effusions. Request is made for diagnostic and therapeutic thoracentesis. EXAM: ULTRASOUND GUIDED DIAGNOSTIC AND THERAPEUTIC RIGHT THORACENTESIS MEDICATIONS: 10 mL 2% lidocaine COMPLICATIONS: None immediate. PROCEDURE: An ultrasound guided thoracentesis was thoroughly discussed with the patient and questions answered. The benefits, risks, alternatives and complications were also discussed. The patient understands and wishes to proceed with the procedure. Written consent was obtained. Ultrasound was performed to localize and mark an adequate pocket of fluid in the right chest. The area was then prepped and draped in the normal sterile fashion. 2% lidocaine was used for local anesthesia. Under ultrasound guidance a Safe-T-Centesis catheter was introduced. Thoracentesis was performed. The catheter was removed and a dressing applied. FINDINGS: A total of approximately 900 mL of amber fluid was removed. Samples were sent to the laboratory as requested by the clinical team. IMPRESSION: Successful ultrasound guided diagnostic and therapeutic right thoracentesis yielding 900 mL of pleural fluid. Read by: Brynda Greathouse PA-C Electronically Signed   By: Aletta Edouard M.D.   On: 11/25/2017 12:19      IMPRESSION: At least stage IIIb (T4, N2, M0/M1a) non-small cell lung cancer, favoring squamous cell carcinoma presenting in the right lung.  Prior history of Stage I left lung cancer and tongue cancer. We discussed the patient is a candidate for a 6 week course of concurrent chemoradiation as well as a 2-3 week course of palliative radiation. I spent a significant amount of time comparing these options and answering questions on behalf of the patient's daughter and son. We discussed the course of treatment, side effects, and  long term toxicities of both radiotherapy options. She appears to understand her options. She would like to take some time to consider her possible treatment options.  PLAN: The patient is deciding whether to proceed with a 6 week course of concurrent chemoradiation, palliative radiation therapy over 2-3 weeks, or possibly hospice referral. Performance status is a real issue for her regarding the ability to tolerate treatment.The patient's daughter will contact the patient's primary care physician for home healthcare referral as it relates to skin care issues in the pelvis region.      ------------------------------------------------  Blair Promise, PhD, MD  This document serves as a record of services personally performed by Gery Pray, MD. It was created on his behalf by Bethann Humble, a trained medical scribe. The creation of this record is based on the scribe's personal observations and the provider's statements to them. This document has been checked and approved by the attending provider.

## 2017-12-09 ENCOUNTER — Encounter: Payer: Self-pay | Admitting: Family Medicine

## 2017-12-10 ENCOUNTER — Encounter: Payer: Self-pay | Admitting: *Deleted

## 2017-12-10 NOTE — Progress Notes (Signed)
[place] Psychosocial Distress Screening Clinical Social Work  Clinical Social Work was referred by distress screening protocol.  The patient scored a 1 on the Psychosocial Distress Thermometer which indicates mild distress. Clinical Social Worker contacted patient by phone to assess for distress and other psychosocial needs. Ms. Santoli reported she has had time to think about pursuing treatment and is agreeable to 3 weeks of radiation treatment.  She indicated her anxiety was surrounding meeting with the healthcare team and determining a treatment plan.  She feels she is coping adequately at this time.  She requested CSW speak with her daughter [name]. [name] reported her mother is doing well but is most concerned with her leg swelling and ambulatory needs- she's requested home health services through her PCP.  [name] also interested in Palliative care services in the home- CSW provided referral form to Dr. Clabe Seal office.  [facility] DISTRESS SCREENING 12/08/2017  Screening Type Initial Screening  Distress experienced in past week (1-10) 1  Practical problem type   Emotional problem type Nervousness/Anxiety;Adjusting to illness  Information Concerns Type Lack of info about complementary therapy choices  Physical Problem type Pain;Nausea/vomiting;Sleep/insomnia;Getting around;Bathing/dressing;Breathing;Loss of appetitie;Talking;Constipation/diarrhea;Skin dry/itchy;Swollen arms/legs  Physician notified of physical symptoms   Referral to clinical social work    [place] 12/04/2017  Screening Type Initial Screening  Distress experienced in past week (1-10) 6  Practical problem type   Emotional problem type Adjusting to illness;Adjusting to appearance changes  Information Concerns Type Lack of info about diagnosis;Lack of info about treatment  Physical Problem type Pain;Getting around;Bathing/dressing;Breathing;Loss of appetitie;Talking;Constipation/diarrhea;Skin dry/itchy;Swollen arms/legs   Physician notified of physical symptoms   Referral to clinical social work Yes   Maryjean Morn, MSW, LCSW, OSW-C Clinical Social Worker [facility] [phone]

## 2017-12-11 ENCOUNTER — Encounter: Payer: Self-pay | Admitting: *Deleted

## 2017-12-11 ENCOUNTER — Telehealth: Payer: Self-pay | Admitting: Family Medicine

## 2017-12-11 DIAGNOSIS — J439 Emphysema, unspecified: Secondary | ICD-10-CM | POA: Diagnosis not present

## 2017-12-11 DIAGNOSIS — L97218 Non-pressure chronic ulcer of right calf with other specified severity: Secondary | ICD-10-CM | POA: Diagnosis not present

## 2017-12-11 DIAGNOSIS — L89152 Pressure ulcer of sacral region, stage 2: Secondary | ICD-10-CM | POA: Diagnosis not present

## 2017-12-11 DIAGNOSIS — Z85810 Personal history of malignant neoplasm of tongue: Secondary | ICD-10-CM | POA: Diagnosis not present

## 2017-12-11 DIAGNOSIS — L97228 Non-pressure chronic ulcer of left calf with other specified severity: Secondary | ICD-10-CM | POA: Diagnosis not present

## 2017-12-11 DIAGNOSIS — C3412 Malignant neoplasm of upper lobe, left bronchus or lung: Secondary | ICD-10-CM | POA: Diagnosis not present

## 2017-12-11 DIAGNOSIS — E43 Unspecified severe protein-calorie malnutrition: Secondary | ICD-10-CM | POA: Diagnosis not present

## 2017-12-11 DIAGNOSIS — R0902 Hypoxemia: Secondary | ICD-10-CM | POA: Diagnosis not present

## 2017-12-11 DIAGNOSIS — I472 Ventricular tachycardia: Secondary | ICD-10-CM | POA: Diagnosis not present

## 2017-12-11 DIAGNOSIS — J189 Pneumonia, unspecified organism: Secondary | ICD-10-CM | POA: Diagnosis not present

## 2017-12-11 DIAGNOSIS — R590 Localized enlarged lymph nodes: Secondary | ICD-10-CM | POA: Diagnosis not present

## 2017-12-11 DIAGNOSIS — Z4800 Encounter for change or removal of nonsurgical wound dressing: Secondary | ICD-10-CM | POA: Diagnosis not present

## 2017-12-11 DIAGNOSIS — I10 Essential (primary) hypertension: Secondary | ICD-10-CM | POA: Diagnosis not present

## 2017-12-11 DIAGNOSIS — I4891 Unspecified atrial fibrillation: Secondary | ICD-10-CM | POA: Diagnosis not present

## 2017-12-11 DIAGNOSIS — R918 Other nonspecific abnormal finding of lung field: Secondary | ICD-10-CM | POA: Diagnosis not present

## 2017-12-11 DIAGNOSIS — I772 Rupture of artery: Secondary | ICD-10-CM | POA: Diagnosis not present

## 2017-12-11 DIAGNOSIS — J90 Pleural effusion, not elsewhere classified: Secondary | ICD-10-CM | POA: Diagnosis not present

## 2017-12-11 DIAGNOSIS — Z9981 Dependence on supplemental oxygen: Secondary | ICD-10-CM | POA: Diagnosis not present

## 2017-12-11 DIAGNOSIS — L89523 Pressure ulcer of left ankle, stage 3: Secondary | ICD-10-CM | POA: Diagnosis not present

## 2017-12-11 DIAGNOSIS — I700 Atherosclerosis of aorta: Secondary | ICD-10-CM | POA: Diagnosis not present

## 2017-12-11 NOTE — Telephone Encounter (Signed)
Phone call to [place]. Verbal orders given for PT, RN skin evaluation, and home health aide for bathing. Brenda Gardner verbalizes understanding.

## 2017-12-11 NOTE — Telephone Encounter (Signed)
Copied from [place] [phone]. Topic: Inquiry >> Dec 11, 2017  1:03 PM Lennox Solders wrote: Reason for CRM: Cecile Hearing physical therapist is calling from [facility] . Pt needs physical therapy for once a wk for 1 wk and then twice a wk for 3 wks. Pt was discharge from hospital on 11-27-17. Pt also needs nurse evaluation to evaluate skin problems. Cecile Hearing is also requesting home health aid for twice a wk for 3 wks to assistant bathing

## 2017-12-11 NOTE — Progress Notes (Signed)
Oncology Nurse Navigator Documentation  Oncology Nurse Navigator Flowsheets 12/11/2017  Navigator Location [facility]  Navigator Encounter Type Other/I received a message from [place]. Patient would like to receive XRT.  I notified Dr. Sondra Come and his scheduler.   Treatment Phase Pre-Tx/Tx Discussion  Barriers/Navigation Needs Coordination of Care  Interventions Coordination of Care  Coordination of Care Other  Acuity Level 2  Time Spent with Patient 15

## 2017-12-12 ENCOUNTER — Telehealth: Payer: Self-pay | Admitting: Oncology

## 2017-12-12 NOTE — Telephone Encounter (Signed)
Patient's daughter, [name] left a message saying that Adiana had decided to proceed with radiation and is wondering how to proceed.  Called her back and let her know that Dr. Sondra Come will be notified and that Reniya should be receiving a call early next week to set up a [place] appointment.  She verbalized understanding.

## 2017-12-13 DIAGNOSIS — I472 Ventricular tachycardia: Secondary | ICD-10-CM | POA: Diagnosis not present

## 2017-12-13 DIAGNOSIS — L97218 Non-pressure chronic ulcer of right calf with other specified severity: Secondary | ICD-10-CM | POA: Diagnosis not present

## 2017-12-13 DIAGNOSIS — E43 Unspecified severe protein-calorie malnutrition: Secondary | ICD-10-CM | POA: Diagnosis not present

## 2017-12-13 DIAGNOSIS — J90 Pleural effusion, not elsewhere classified: Secondary | ICD-10-CM | POA: Diagnosis not present

## 2017-12-13 DIAGNOSIS — I10 Essential (primary) hypertension: Secondary | ICD-10-CM | POA: Diagnosis not present

## 2017-12-13 DIAGNOSIS — I700 Atherosclerosis of aorta: Secondary | ICD-10-CM | POA: Diagnosis not present

## 2017-12-13 DIAGNOSIS — R918 Other nonspecific abnormal finding of lung field: Secondary | ICD-10-CM | POA: Diagnosis not present

## 2017-12-13 DIAGNOSIS — J189 Pneumonia, unspecified organism: Secondary | ICD-10-CM | POA: Diagnosis not present

## 2017-12-13 DIAGNOSIS — L97228 Non-pressure chronic ulcer of left calf with other specified severity: Secondary | ICD-10-CM | POA: Diagnosis not present

## 2017-12-13 DIAGNOSIS — Z4800 Encounter for change or removal of nonsurgical wound dressing: Secondary | ICD-10-CM | POA: Diagnosis not present

## 2017-12-13 DIAGNOSIS — J439 Emphysema, unspecified: Secondary | ICD-10-CM | POA: Diagnosis not present

## 2017-12-13 DIAGNOSIS — I772 Rupture of artery: Secondary | ICD-10-CM | POA: Diagnosis not present

## 2017-12-13 DIAGNOSIS — L89152 Pressure ulcer of sacral region, stage 2: Secondary | ICD-10-CM | POA: Diagnosis not present

## 2017-12-13 DIAGNOSIS — R590 Localized enlarged lymph nodes: Secondary | ICD-10-CM | POA: Diagnosis not present

## 2017-12-13 DIAGNOSIS — C3412 Malignant neoplasm of upper lobe, left bronchus or lung: Secondary | ICD-10-CM | POA: Diagnosis not present

## 2017-12-13 DIAGNOSIS — Z85810 Personal history of malignant neoplasm of tongue: Secondary | ICD-10-CM | POA: Diagnosis not present

## 2017-12-13 DIAGNOSIS — R0902 Hypoxemia: Secondary | ICD-10-CM | POA: Diagnosis not present

## 2017-12-13 DIAGNOSIS — L89523 Pressure ulcer of left ankle, stage 3: Secondary | ICD-10-CM | POA: Diagnosis not present

## 2017-12-13 DIAGNOSIS — I4891 Unspecified atrial fibrillation: Secondary | ICD-10-CM | POA: Diagnosis not present

## 2017-12-13 DIAGNOSIS — Z9981 Dependence on supplemental oxygen: Secondary | ICD-10-CM | POA: Diagnosis not present

## 2017-12-15 ENCOUNTER — Telehealth: Payer: Self-pay | Admitting: Family Medicine

## 2017-12-15 DIAGNOSIS — Z4800 Encounter for change or removal of nonsurgical wound dressing: Secondary | ICD-10-CM | POA: Diagnosis not present

## 2017-12-15 DIAGNOSIS — L89523 Pressure ulcer of left ankle, stage 3: Secondary | ICD-10-CM | POA: Diagnosis not present

## 2017-12-15 DIAGNOSIS — L89152 Pressure ulcer of sacral region, stage 2: Secondary | ICD-10-CM | POA: Diagnosis not present

## 2017-12-15 DIAGNOSIS — I4891 Unspecified atrial fibrillation: Secondary | ICD-10-CM | POA: Diagnosis not present

## 2017-12-15 DIAGNOSIS — C3412 Malignant neoplasm of upper lobe, left bronchus or lung: Secondary | ICD-10-CM | POA: Diagnosis not present

## 2017-12-15 DIAGNOSIS — L97218 Non-pressure chronic ulcer of right calf with other specified severity: Secondary | ICD-10-CM | POA: Diagnosis not present

## 2017-12-15 DIAGNOSIS — J439 Emphysema, unspecified: Secondary | ICD-10-CM | POA: Diagnosis not present

## 2017-12-15 DIAGNOSIS — R918 Other nonspecific abnormal finding of lung field: Secondary | ICD-10-CM | POA: Diagnosis not present

## 2017-12-15 DIAGNOSIS — I700 Atherosclerosis of aorta: Secondary | ICD-10-CM | POA: Diagnosis not present

## 2017-12-15 DIAGNOSIS — R0902 Hypoxemia: Secondary | ICD-10-CM | POA: Diagnosis not present

## 2017-12-15 DIAGNOSIS — J189 Pneumonia, unspecified organism: Secondary | ICD-10-CM | POA: Diagnosis not present

## 2017-12-15 DIAGNOSIS — Z9981 Dependence on supplemental oxygen: Secondary | ICD-10-CM | POA: Diagnosis not present

## 2017-12-15 DIAGNOSIS — I472 Ventricular tachycardia: Secondary | ICD-10-CM | POA: Diagnosis not present

## 2017-12-15 DIAGNOSIS — Z85810 Personal history of malignant neoplasm of tongue: Secondary | ICD-10-CM | POA: Diagnosis not present

## 2017-12-15 DIAGNOSIS — L97228 Non-pressure chronic ulcer of left calf with other specified severity: Secondary | ICD-10-CM | POA: Diagnosis not present

## 2017-12-15 DIAGNOSIS — R590 Localized enlarged lymph nodes: Secondary | ICD-10-CM | POA: Diagnosis not present

## 2017-12-15 DIAGNOSIS — I10 Essential (primary) hypertension: Secondary | ICD-10-CM | POA: Diagnosis not present

## 2017-12-15 DIAGNOSIS — I772 Rupture of artery: Secondary | ICD-10-CM | POA: Diagnosis not present

## 2017-12-15 DIAGNOSIS — J90 Pleural effusion, not elsewhere classified: Secondary | ICD-10-CM | POA: Diagnosis not present

## 2017-12-15 DIAGNOSIS — E43 Unspecified severe protein-calorie malnutrition: Secondary | ICD-10-CM | POA: Diagnosis not present

## 2017-12-15 NOTE — Telephone Encounter (Signed)
PT with [facility] is visiting patient. She is reporting the patient has bilateral LE edema with the Right let weeping at the front tibia area. Also, the patient has a Stage 3 sacral pressure would. She reports the patient has not been lying down but sleeps in a chair with her legs hanging down. She reports the patient has some anxiety and that the sacral would is causing her some pain. The daughter is her caregiver so the therapist will review some techniques with her regarding helping the patient lay on her sides and keeping her legs elevated. She reports the patient's vitals are normal and the [facility] nurse is scheduled to come out tomorrow.  Please advise home health RN with orders if needed.

## 2017-12-16 ENCOUNTER — Other Ambulatory Visit: Payer: Self-pay | Admitting: Family Medicine

## 2017-12-16 DIAGNOSIS — J439 Emphysema, unspecified: Secondary | ICD-10-CM | POA: Diagnosis not present

## 2017-12-16 DIAGNOSIS — I10 Essential (primary) hypertension: Secondary | ICD-10-CM | POA: Diagnosis not present

## 2017-12-16 DIAGNOSIS — J90 Pleural effusion, not elsewhere classified: Secondary | ICD-10-CM | POA: Diagnosis not present

## 2017-12-16 DIAGNOSIS — I772 Rupture of artery: Secondary | ICD-10-CM | POA: Diagnosis not present

## 2017-12-16 DIAGNOSIS — L97218 Non-pressure chronic ulcer of right calf with other specified severity: Secondary | ICD-10-CM | POA: Diagnosis not present

## 2017-12-16 DIAGNOSIS — E43 Unspecified severe protein-calorie malnutrition: Secondary | ICD-10-CM | POA: Diagnosis not present

## 2017-12-16 DIAGNOSIS — Z9981 Dependence on supplemental oxygen: Secondary | ICD-10-CM | POA: Diagnosis not present

## 2017-12-16 DIAGNOSIS — I472 Ventricular tachycardia: Secondary | ICD-10-CM | POA: Diagnosis not present

## 2017-12-16 DIAGNOSIS — R918 Other nonspecific abnormal finding of lung field: Secondary | ICD-10-CM | POA: Diagnosis not present

## 2017-12-16 DIAGNOSIS — J189 Pneumonia, unspecified organism: Secondary | ICD-10-CM | POA: Diagnosis not present

## 2017-12-16 DIAGNOSIS — R0902 Hypoxemia: Secondary | ICD-10-CM | POA: Diagnosis not present

## 2017-12-16 DIAGNOSIS — R590 Localized enlarged lymph nodes: Secondary | ICD-10-CM | POA: Diagnosis not present

## 2017-12-16 DIAGNOSIS — C3412 Malignant neoplasm of upper lobe, left bronchus or lung: Secondary | ICD-10-CM | POA: Diagnosis not present

## 2017-12-16 DIAGNOSIS — I700 Atherosclerosis of aorta: Secondary | ICD-10-CM | POA: Diagnosis not present

## 2017-12-16 DIAGNOSIS — L97228 Non-pressure chronic ulcer of left calf with other specified severity: Secondary | ICD-10-CM | POA: Diagnosis not present

## 2017-12-16 DIAGNOSIS — L89523 Pressure ulcer of left ankle, stage 3: Secondary | ICD-10-CM | POA: Diagnosis not present

## 2017-12-16 DIAGNOSIS — I4891 Unspecified atrial fibrillation: Secondary | ICD-10-CM | POA: Diagnosis not present

## 2017-12-16 DIAGNOSIS — L89152 Pressure ulcer of sacral region, stage 2: Secondary | ICD-10-CM | POA: Diagnosis not present

## 2017-12-16 DIAGNOSIS — Z85810 Personal history of malignant neoplasm of tongue: Secondary | ICD-10-CM | POA: Diagnosis not present

## 2017-12-16 DIAGNOSIS — Z4800 Encounter for change or removal of nonsurgical wound dressing: Secondary | ICD-10-CM | POA: Diagnosis not present

## 2017-12-16 MED ORDER — TRAMADOL HCL 50 MG PO TABS: 50.0000 mg | ORAL_TABLET | Freq: Three times a day (TID) | ORAL | 0 refills | Status: AC | PRN

## 2017-12-16 NOTE — Progress Notes (Signed)
Spoke with [facility]  Patient having increased pain at biopsy site. Alternating tylenol and ibu without much control She is going to have wound care nurse look at stage 2 sacral pressure ulcer Has a new foam padding on her chair Working of changing positions q 2 hours and keeping legs elevated Patient does have hospital bed Having some problems with sleep, feeling frightful.  rx tramadol. [place] for wound care consult. Cont with supportive measures. Will see if tramadol also helps with sleep.  R/se/b reviewed.

## 2017-12-18 ENCOUNTER — Telehealth: Payer: Self-pay | Admitting: Family Medicine

## 2017-12-18 DIAGNOSIS — I4891 Unspecified atrial fibrillation: Secondary | ICD-10-CM | POA: Diagnosis not present

## 2017-12-18 DIAGNOSIS — I772 Rupture of artery: Secondary | ICD-10-CM | POA: Diagnosis not present

## 2017-12-18 DIAGNOSIS — Z4800 Encounter for change or removal of nonsurgical wound dressing: Secondary | ICD-10-CM | POA: Diagnosis not present

## 2017-12-18 DIAGNOSIS — J439 Emphysema, unspecified: Secondary | ICD-10-CM | POA: Diagnosis not present

## 2017-12-18 DIAGNOSIS — R590 Localized enlarged lymph nodes: Secondary | ICD-10-CM | POA: Diagnosis not present

## 2017-12-18 DIAGNOSIS — J189 Pneumonia, unspecified organism: Secondary | ICD-10-CM | POA: Diagnosis not present

## 2017-12-18 DIAGNOSIS — R918 Other nonspecific abnormal finding of lung field: Secondary | ICD-10-CM | POA: Diagnosis not present

## 2017-12-18 DIAGNOSIS — I10 Essential (primary) hypertension: Secondary | ICD-10-CM | POA: Diagnosis not present

## 2017-12-18 DIAGNOSIS — L97218 Non-pressure chronic ulcer of right calf with other specified severity: Secondary | ICD-10-CM | POA: Diagnosis not present

## 2017-12-18 DIAGNOSIS — Z9981 Dependence on supplemental oxygen: Secondary | ICD-10-CM | POA: Diagnosis not present

## 2017-12-18 DIAGNOSIS — R0902 Hypoxemia: Secondary | ICD-10-CM | POA: Diagnosis not present

## 2017-12-18 DIAGNOSIS — I472 Ventricular tachycardia: Secondary | ICD-10-CM | POA: Diagnosis not present

## 2017-12-18 DIAGNOSIS — I700 Atherosclerosis of aorta: Secondary | ICD-10-CM | POA: Diagnosis not present

## 2017-12-18 DIAGNOSIS — E43 Unspecified severe protein-calorie malnutrition: Secondary | ICD-10-CM | POA: Diagnosis not present

## 2017-12-18 DIAGNOSIS — L97228 Non-pressure chronic ulcer of left calf with other specified severity: Secondary | ICD-10-CM | POA: Diagnosis not present

## 2017-12-18 DIAGNOSIS — L89152 Pressure ulcer of sacral region, stage 2: Secondary | ICD-10-CM | POA: Diagnosis not present

## 2017-12-18 DIAGNOSIS — Z85810 Personal history of malignant neoplasm of tongue: Secondary | ICD-10-CM | POA: Diagnosis not present

## 2017-12-18 DIAGNOSIS — L89523 Pressure ulcer of left ankle, stage 3: Secondary | ICD-10-CM | POA: Diagnosis not present

## 2017-12-18 DIAGNOSIS — C3412 Malignant neoplasm of upper lobe, left bronchus or lung: Secondary | ICD-10-CM | POA: Diagnosis not present

## 2017-12-18 DIAGNOSIS — J90 Pleural effusion, not elsewhere classified: Secondary | ICD-10-CM | POA: Diagnosis not present

## 2017-12-18 NOTE — Telephone Encounter (Signed)
Delayed entry. Spoke with [facility] nurse on Tuesday April 16th. See  Note.

## 2017-12-18 NOTE — Telephone Encounter (Signed)
Copied from [place] [phone]. Topic: Quick Communication - See Telephone Encounter >> Dec 18, 2017  2:47 PM Cleaster Corin, NT wrote: CRM for notification. See Telephone encounter for: 12/18/17.  Apolonio Schneiders from [facility] calling to see if she will be able to get an update from the pt. Appt/. On tomorrow 12/19/17 about wounds on pt. And weeping.  [phone]

## 2017-12-19 ENCOUNTER — Ambulatory Visit: Payer: Medicare Other | Admitting: Family Medicine

## 2017-12-19 DIAGNOSIS — R918 Other nonspecific abnormal finding of lung field: Secondary | ICD-10-CM | POA: Diagnosis not present

## 2017-12-19 DIAGNOSIS — Z9981 Dependence on supplemental oxygen: Secondary | ICD-10-CM | POA: Diagnosis not present

## 2017-12-19 DIAGNOSIS — I4891 Unspecified atrial fibrillation: Secondary | ICD-10-CM | POA: Diagnosis not present

## 2017-12-19 DIAGNOSIS — R0902 Hypoxemia: Secondary | ICD-10-CM | POA: Diagnosis not present

## 2017-12-19 DIAGNOSIS — C3412 Malignant neoplasm of upper lobe, left bronchus or lung: Secondary | ICD-10-CM | POA: Diagnosis not present

## 2017-12-19 DIAGNOSIS — E43 Unspecified severe protein-calorie malnutrition: Secondary | ICD-10-CM | POA: Diagnosis not present

## 2017-12-19 DIAGNOSIS — I772 Rupture of artery: Secondary | ICD-10-CM | POA: Diagnosis not present

## 2017-12-19 DIAGNOSIS — J90 Pleural effusion, not elsewhere classified: Secondary | ICD-10-CM | POA: Diagnosis not present

## 2017-12-19 DIAGNOSIS — J189 Pneumonia, unspecified organism: Secondary | ICD-10-CM | POA: Diagnosis not present

## 2017-12-19 DIAGNOSIS — L89152 Pressure ulcer of sacral region, stage 2: Secondary | ICD-10-CM | POA: Diagnosis not present

## 2017-12-19 DIAGNOSIS — L97228 Non-pressure chronic ulcer of left calf with other specified severity: Secondary | ICD-10-CM | POA: Diagnosis not present

## 2017-12-19 DIAGNOSIS — Z85810 Personal history of malignant neoplasm of tongue: Secondary | ICD-10-CM | POA: Diagnosis not present

## 2017-12-19 DIAGNOSIS — L89523 Pressure ulcer of left ankle, stage 3: Secondary | ICD-10-CM | POA: Diagnosis not present

## 2017-12-19 DIAGNOSIS — I472 Ventricular tachycardia: Secondary | ICD-10-CM | POA: Diagnosis not present

## 2017-12-19 DIAGNOSIS — Z4800 Encounter for change or removal of nonsurgical wound dressing: Secondary | ICD-10-CM | POA: Diagnosis not present

## 2017-12-19 DIAGNOSIS — I700 Atherosclerosis of aorta: Secondary | ICD-10-CM | POA: Diagnosis not present

## 2017-12-19 DIAGNOSIS — R590 Localized enlarged lymph nodes: Secondary | ICD-10-CM | POA: Diagnosis not present

## 2017-12-19 DIAGNOSIS — J439 Emphysema, unspecified: Secondary | ICD-10-CM | POA: Diagnosis not present

## 2017-12-19 DIAGNOSIS — L97218 Non-pressure chronic ulcer of right calf with other specified severity: Secondary | ICD-10-CM | POA: Diagnosis not present

## 2017-12-19 DIAGNOSIS — I10 Essential (primary) hypertension: Secondary | ICD-10-CM | POA: Diagnosis not present

## 2017-12-19 NOTE — Telephone Encounter (Signed)
Office visit is with you tomorrow.

## 2017-12-20 ENCOUNTER — Ambulatory Visit: Payer: Medicare Other | Admitting: Family Medicine

## 2017-12-20 ENCOUNTER — Encounter: Payer: Self-pay | Admitting: Family Medicine

## 2017-12-20 ENCOUNTER — Other Ambulatory Visit: Payer: Self-pay

## 2017-12-20 VITALS — BP 130/50 | HR 72 | Temp 95.2°F | Ht 64.0 in | Wt 119.0 lb

## 2017-12-20 DIAGNOSIS — R601 Generalized edema: Secondary | ICD-10-CM | POA: Diagnosis not present

## 2017-12-20 DIAGNOSIS — E8809 Other disorders of plasma-protein metabolism, not elsewhere classified: Secondary | ICD-10-CM

## 2017-12-20 DIAGNOSIS — L89159 Pressure ulcer of sacral region, unspecified stage: Secondary | ICD-10-CM

## 2017-12-20 DIAGNOSIS — D649 Anemia, unspecified: Secondary | ICD-10-CM | POA: Diagnosis not present

## 2017-12-20 DIAGNOSIS — R238 Other skin changes: Secondary | ICD-10-CM

## 2017-12-20 DIAGNOSIS — E871 Hypo-osmolality and hyponatremia: Secondary | ICD-10-CM

## 2017-12-20 LAB — IFOBT (OCCULT BLOOD): IMMUNOLOGICAL FECAL OCCULT BLOOD TEST: NEGATIVE

## 2017-12-20 LAB — POCT CBC
Granulocyte percent: 81 %G — AB (ref 37–80)
HCT, POC: 30.8 % — AB (ref 37.7–47.9)
Hemoglobin: 10.2 g/dL — AB (ref 12.2–16.2)
LYMPH, POC: 2 (ref 0.6–3.4)
MCH, POC: 28.7 pg (ref 27–31.2)
MCHC: 33.1 g/dL (ref 31.8–35.4)
MCV: 86.7 fL (ref 80–97)
MID (cbc): 0.3 (ref 0–0.9)
MPV: 5.6 fL (ref 0–99.8)
PLATELET COUNT, POC: 307 10*3/uL (ref 142–424)
POC Granulocyte: 9.9 — AB (ref 2–6.9)
POC LYMPH %: 16.8 % (ref 10–50)
POC MID %: 2.2 %M (ref 0–12)
RBC: 3.55 M/uL — AB (ref 4.04–5.48)
RDW, POC: 13.7 %
WBC: 12.2 10*3/uL — AB (ref 4.6–10.2)

## 2017-12-20 MED ORDER — CEPHALEXIN 500 MG PO CAPS: 500.0000 mg | ORAL_CAPSULE | Freq: Two times a day (BID) | ORAL | 0 refills | Status: AC

## 2017-12-20 NOTE — Patient Instructions (Addendum)
Swelling in legs may be due to multiple causes, but nutrition (low protein) may be a factor as well as anemia.  Blood count was slightly lower today than when last checked, but not at a dangerous level at this time and there was no blood in the stool. I will check some other nutritional testing including protein which was low when last checked at oncology.  Try to increase nutrition with Ensure protein drink or other nutritional supplement 2-3 times per day.   I did prescribe an antibiotic to cover for possible infection in the legs.  Elevate legs as much as possible to help with swelling. Please follow-up with Dr. Pamella Pert on Monday for recheck.  If any worsening symptoms in the next 3 days, please proceed to the emergency room.  Pressure wound on the lower back side appears to be stable.  This can also be rechecked with your primary care provider at next visit.  Continue soft padding under that area and frequent position changes every 2 hours while awake to decrease pressure.  Return to the clinic or go to the nearest emergency room if any of your symptoms worsen or new symptoms occur.   Peripheral Edema Peripheral edema is swelling that is caused by a buildup of fluid. Peripheral edema most often affects the lower legs, ankles, and feet. It can also develop in the arms, hands, and face. The area of the body that has peripheral edema will look swollen. It may also feel heavy or warm. Your clothes may start to feel tight. Pressing on the area may make a temporary dent in your skin. You may not be able to move your arm or leg as much as usual. There are many causes of peripheral edema. It can be a complication of other diseases, such as congestive heart failure, kidney disease, or a problem with your blood circulation. It also can be a side effect of certain medicines. It often happens to women during pregnancy. Sometimes, the cause is not known. Treating the underlying condition is often the only  treatment for peripheral edema. Follow these instructions at home: Pay attention to any changes in your symptoms. Take these actions to help with your discomfort:  Raise (elevate) your legs while you are sitting or lying down.  Move around often to prevent stiffness and to lessen swelling. Do not sit or stand for long periods of time.  Wear support stockings as told by your health care provider.  Follow instructions from your health care provider about limiting salt (sodium) in your diet. Sometimes eating less salt can reduce swelling.  Take over-the-counter and prescription medicines only as told by your health care provider. Your health care provider may prescribe medicine to help your body get rid of excess water (diuretic).  Keep all follow-up visits as told by your health care provider. This is important.  Contact a health care provider if:  You have a fever.  Your edema starts suddenly or is getting worse, especially if you are pregnant or have a medical condition.  You have swelling in only one leg.  You have increased swelling and pain in your legs. Get help right away if:  You develop shortness of breath, especially when you are lying down.  You have pain in your chest or abdomen.  You feel weak.  You faint. This information is not intended to replace advice given to you by your health care provider. Make sure you discuss any questions you have with your health care provider.  Document Released: 09/26/2004 Document Revised: 01/22/2016 Document Reviewed: 03/01/2015 Elsevier Interactive Patient Education  2018 [employer].   IF you received an x-ray today, you will receive an invoice from [facility]. Please contact [facility] at [phone] with questions or concerns regarding your invoice.   IF you received labwork today, you will receive an invoice from [facility]. Please contact [facility] at [phone] with questions or concerns regarding your  invoice.   Our billing staff will not be able to assist you with questions regarding bills from these companies.  You will be contacted with the lab results as soon as they are available. The fastest way to get your results is to activate your My Chart account. Instructions are located on the last page of this paperwork. If you have not heard from [place] regarding the results in 2 weeks, please contact this office.

## 2017-12-20 NOTE — Progress Notes (Addendum)
Subjective:  By signing my name below, I, Brenda Gardner, attest that this documentation has been prepared under the direction and in the presence of Brenda Ray, MD. Electronically Signed: Moises Gardner, [place]. 12/20/2017 , 12:21 PM .  Patient was seen in Room 1 .   Patient ID: Brenda Gardner, female    DOB: 04-Jan-1933, 84 y.o.   MRN: [id number] Chief Complaint  Patient presents with  . Adema in legs    both legs are very pink and swollen   HPI Brenda Gardner is a 84 y.o. female  She is a patient of Brenda Gardner. Patient was admitted in March for respiratory failure with RUL mass with postobstructive pneumonia with lung cancer per PET scan. She had an appointment with oncology on April 4th, IIIB non small cell lung cancer; recent message reviewed, plans on continuing radiation. She had afib postobstructive pneumonia with pneumonitis, treated with Augmentin and home oxygen. She has swelling of both arms. She was seen by Brenda Gardner on April 2nd with bilateral upper extremity edema, thought independent edema with bullae on extensor surfaces of forearms, plan for follow up in 1 month. See multiple phone calls since that visit. She is followed by nurse visit from Brenda Gardner, and home nursing evaluation of skin problems; also home health aid with physical therapy.   Patient was noted to have bilateral lower extremity edema and sacral pressure wound based on home nursing note 5 days ago. Plan for wound care nurse to evaluate the sacral pressure ulcer and foam padding for her chair. And, tramadol was provided to help with pain of biopsy site. There was some weeping from the front tibia area from phone note 5 days ago. Did recommend elevation of leg with change in position every 2 hours. She has a hospital bed at home.   Today Patient reports leg swelling gradually worsening after hospitalization, noticed a few weeks ago. Her daughter initially measured leg circumference to be about 14  inches, and now circumference peaked at 17 inches over 7-10 days. Patient notes propping her legs up and elevating them is very uncomfortable for her. She informs her legs were weeping Monday (5 days ago, 12/15/17) with yellow-clear discharge with some Gardner, and a little into Tuesday (4 days ago, 12/16/17) but this had improved. She mentions her legs have been red, but progressively worsening. She describes her legs being sensitive and more uncomfortable over the skin with touch. She denies any change in breathing unless exerting herself more. She denies fever. She denies any new chest pains. She does mention feeling cold. She finished antibiotics about a week ago; prescribed on March 29th, 14 day course. She notes the swelling in her upper extremities have improved.   She was brought in by her daughter today.   Recent labs, office visits reviewed with primary provider as well as oncology.  CMP reviewed from April 4, notable for low total protein at 6.2.  Albumin of 2.5, sodium 132.  Hemoglobin 10.8 on April 4, down from 11.8 on April 1.   Wt Readings from Last 3 Encounters:  12/20/17 119 lb (54 kg)  12/08/17 112 lb 8 oz (51 kg)  12/04/17 113 lb 14.4 oz (51.7 kg)     Patient Active Problem List   Diagnosis Date Noted  . Pressure injury of skin 11/25/2017  . Acute hypoxemic respiratory failure ([place]) 11/25/2017  . Atrial fibrillation ([place]) 11/25/2017  . Aortic atherosclerosis ([place]) 11/25/2017  . 3-vessel CAD 11/25/2017  .  Mass of upper lobe of right lung 11/24/2017  . Protein-calorie malnutrition, severe ([place]) 01/09/2014  . Tongue cancer ([place]) 01/07/2014  . Hypertension 08/06/2013  . Tobacco abuse 08/06/2013  . Nodule of left lung 12/15/2012  . Weight loss 09/29/2012  . Cough 09/29/2012  . Hemochromatosis, hereditary ([place]) 08/14/2011   Past Medical History:  Diagnosis Date  . Anxiety    SINCE THIS IS COMING UP  . Arthritis   . Cancer ([place])   . Cataract   . Complication of anesthesia    . Hemochromatosis   . Hypertension   . Lesion of left lung    ONLY DOING RADIATION  . PONV (postoperative nausea and vomiting)    56 YRS AGO DURING CHILDBIRTH  . Radiation 03/15/14-03/28/14   left apical nodule 50 gray   Past Surgical History:  Procedure Laterality Date  . APPENDECTOMY    . EYE SURGERY     BIL CATARACTS  . GLOSSECTOMY Left 01/07/2014   Procedure: PARTIAL GLOSSECTOMY;  Surgeon: Brenda Gala, MD;  Location: [facility];  Service: ENT;  Laterality: Left;  . IR THORACENTESIS ASP PLEURAL SPACE W/IMG GUIDE  11/25/2017  . RADICAL NECK DISSECTION Left 01/07/2014   Procedure: LEFT  NECK DISSECTION WITH FROZEN SECTION;  Surgeon: Brenda Gala, MD;  Location: [facility];  Service: ENT;  Laterality: Left;  . TONSILLECTOMY    . TUBAL LIGATION    . VEIN LIGATION Bilateral 1980's   Allergies  Allergen Reactions  . Lisinopril Swelling    Swelling of tongue   Prior to Admission medications   Medication Sig Start Date End Date Taking? Authorizing Provider  benzonatate (TESSALON) 200 MG capsule Take 1 capsule (200 mg total) by mouth 3 (three) times daily as needed for cough. 11/26/17   Lacroce, Hulen Shouts, MD  collagenase (SANTYL) ointment Apply topically daily. 11/27/17   Lacroce, Hulen Shouts, MD  ENSURE PLUS (ENSURE PLUS) LIQD Take 237 mLs by mouth daily.    [provider]  naproxen (NAPROSYN) 250 MG tablet Take 250 mg by mouth daily as needed.    [provider]  traMADol (ULTRAM) 50 MG tablet Take 1 tablet (50 mg total) by mouth every 8 (eight) hours as needed for up to 5 days. 12/16/17 12/21/17  Rutherford Guys, MD   Social History   Socioeconomic History  . Marital status: Divorced    Spouse name: Not on file  . Number of children: 5  . Years of education: Not on file  . Highest education level: Not on file  Occupational History  . Not on file  Social Needs  . Financial resource strain: Not on file  . Food insecurity:    Worry: Not on file    Inability: Not on file    . Transportation needs:    Medical: Not on file    Non-medical: Not on file  Tobacco Use  . Smoking status: Former Smoker    Packs/day: 0.50    Years: 25.00    Pack years: 12.50    Types: Cigarettes    Last attempt to quit: 11/24/2017    Years since quitting: 0.0  . Smokeless tobacco: Never Used  Substance and Sexual Activity  . Alcohol use: Yes    Alcohol/week: 3.5 oz    Types: 7 Standard drinks or equivalent per week  . Drug use: No  . Sexual activity: Not on file  Lifestyle  . Physical activity:    Days per week: Not on file  Minutes per session: Not on file  . Stress: Not on file  Relationships  . Social connections:    Talks on phone: Not on file    Gets together: Not on file    Attends religious service: Not on file    Active member of club or organization: Not on file    Attends meetings of clubs or organizations: Not on file    Relationship status: Not on file  . Intimate partner violence:    Fear of current or ex partner: Not on file    Emotionally abused: Not on file    Physically abused: Not on file    Forced sexual activity: Not on file  Other Topics Concern  . Not on file  Social History Narrative  . Not on file   Review of Systems  Constitutional: Negative for chills, fatigue, fever and unexpected weight change.  Respiratory: Negative for cough and shortness of breath.   Cardiovascular: Positive for leg swelling. Negative for chest pain.  Gastrointestinal: Negative for constipation, diarrhea, nausea and vomiting.  Endocrine: Positive for cold intolerance.  Skin: Positive for color change and wound.  Neurological: Negative for dizziness, weakness and headaches.       Objective:   Physical Exam  Constitutional: She is oriented to person, place, and time. She appears well-developed and well-nourished.  HENT:  Head: Normocephalic and atraumatic.  Eyes: Pupils are equal, round, and reactive to light. Conjunctivae and EOM are normal.  Neck: Carotid  bruit is not present.  Cardiovascular: Normal rate, regular rhythm, normal heart sounds and intact distal pulses.  Pulmonary/Chest: Effort normal and breath sounds normal.  Abdominal: Soft. She exhibits no pulsatile midline mass. There is no tenderness.  Neurological: She is alert and oriented to person, place, and time.  Skin: Skin is warm and dry.  Left foot: there's a small superficial ulcer at the base visualized at left lateral ankle about 3cm across with some erythema that extends approximately 53mm from the ulcer, no active discharge Bilateral lower legs: extending from a few cm below patella is erythematous with confluent bullae, some ecchymotic appearance on the upper medial aspect of the right shin, erythema extends to the ankles only- no erythema to feet, but does have pitting edema to bilateral feet, difficulty palpating DP pulse, cap refill 2-3 seconds at her toes Upper thighs: medial aspect, ecchymosis on the left greater than right also with clear bullae  Psychiatric: She has a normal mood and affect. Her behavior is normal.  Vitals reviewed.   Vitals:   12/20/17 1143  BP: (!) 130/50  Pulse: 72  Temp: (!) 95.2 F (35.1 C)  TempSrc: Oral  SpO2: 100%  Weight: 119 lb (54 kg)  Height: 5\' 4"  (1.626 m)   Results for orders placed or performed in visit on 12/20/17  POCT CBC  Result Value Ref Range   WBC 12.2 (A) 4.6 - 10.2 K/uL   Lymph, poc 2.0 0.6 - 3.4   POC LYMPH PERCENT 16.8 10 - 50 %L   MID (cbc) 0.3 0 - 0.9   POC MID % 2.2 0 - 12 %M   POC Granulocyte 9.9 (A) 2 - 6.9   Granulocyte percent 81.0 (A) 37 - 80 %G   RBC 3.55 (A) 4.04 - 5.48 M/uL   Hemoglobin 10.2 (A) 12.2 - 16.2 g/dL   HCT, POC 30.8 (A) 37.7 - 47.9 %   MCV 86.7 80 - 97 fL   MCH, POC 28.7 27 - 31.2 pg  MCHC 33.1 31.8 - 35.4 g/dL   RDW, POC 13.7 %   Platelet Count, POC 307 142 - 424 K/uL   MPV 5.6 0 - 99.8 fL   Over 40 minutes of face to face care with multiple repeat evals and other physician eval,  history and plan.     Assessment & Plan:    KERRYANN ALLAIRE is a 84 y.o. female Generalized edema - Plan: POCT CBC, Comprehensive metabolic panel, Prealbumin Bullae - Plan: Comprehensive metabolic panel Anemia, unspecified type - Plan: IFOBT POC (occult bld, rslt in office), IFOBT POC (occult bld, rslt in office) Hypoalbuminemia  -Peripheral edema with bullous appearing lesions.  Only noted on edematous areas, no upper extremity bullae or rash noted, no apparent mucous membrane involvement. Some of the edema may be related to hypoalbuminemia with decreased nutritional intake.  Differential also includes infectious component of the lower extremities with slight erythema, specifically bullous impetigo.No apparent signs of fluid overload with lung exam.    -Check CMP with prealbumin to evaluate recent nutritional stores.  Stressed importance of nutritional intake including nutritional supplement 2-3 times per day if needed for now  -Start Keflex 500 mg twice daily until renal function known, but previously looked okay.  -Elevate legs as much as possible.  -Decided against furosemide at this time with prior hyponatremia and unknown electrolytes.  That could be decided at follow-up in 3 days.  Noted to have anemia with slight decline over the past few measurements.  Slight leukocytosis also noted, was elevated when checked a few weeks ago.  Afebrile at present, continue to follow closely for signs of infection other than potentially cellulitis of legs treated with Keflex as above.  -Stool was heme negative, will continue to follow closely outpatient for now based on current vitals.  -ER precautions given if any worsening symptoms prior to follow-up in 72 hours with primary provider.  Pressure injury of skin of sacral region, unspecified injury stage  -Appears to be limited to the skin I do not see any deep component.  Appears to be healing.  Continue foam padding, frequent changes of positions to avoid  pressure to that area.  Hyponatremia  -As above will repeat electrolytes.   Meds ordered this encounter  Medications  . cephALEXin (KEFLEX) 500 MG capsule    Sig: Take 1 capsule (500 mg total) by mouth 2 (two) times daily.    Dispense:  20 capsule    Refill:  0   Patient Instructions    Swelling in legs may be due to multiple causes, but nutrition (low protein) may be a factor as well as anemia.  Gardner count was slightly lower today than when last checked, but not at a dangerous level at this time and there was no Gardner in the stool. I will check some other nutritional testing including protein which was low when last checked at oncology.  Try to increase nutrition with Ensure protein drink or other nutritional supplement 2-3 times per day.   I did prescribe an antibiotic to cover for possible infection in the legs.  Elevate legs as much as possible to help with swelling. Please follow-up with Brenda Gardner on Monday for recheck.  If any worsening symptoms in the next 3 days, please proceed to the emergency room.  Pressure wound on the lower back side appears to be stable.  This can also be rechecked with your primary care provider at next visit.  Continue soft padding under that area and frequent position  changes every 2 hours while awake to decrease pressure.  Return to the clinic or go to the nearest emergency room if any of your symptoms worsen or new symptoms occur.   Peripheral Edema Peripheral edema is swelling that is caused by a buildup of fluid. Peripheral edema most often affects the lower legs, ankles, and feet. It can also develop in the arms, hands, and face. The area of the body that has peripheral edema will look swollen. It may also feel heavy or warm. Your clothes may start to feel tight. Pressing on the area may make a temporary dent in your skin. You may not be able to move your arm or leg as much as usual. There are many causes of peripheral edema. It can be a  complication of other diseases, such as congestive heart failure, kidney disease, or a problem with your Gardner circulation. It also can be a side effect of certain medicines. It often happens to women during pregnancy. Sometimes, the cause is not known. Treating the underlying condition is often the only treatment for peripheral edema. Follow these instructions at home: Pay attention to any changes in your symptoms. Take these actions to help with your discomfort:  Raise (elevate) your legs while you are sitting or lying down.  Move around often to prevent stiffness and to lessen swelling. Do not sit or stand for long periods of time.  Wear support stockings as told by your health care provider.  Follow instructions from your health care provider about limiting salt (sodium) in your diet. Sometimes eating less salt can reduce swelling.  Take over-the-counter and prescription medicines only as told by your health care provider. Your health care provider may prescribe medicine to help your body get rid of excess water (diuretic).  Keep all follow-up visits as told by your health care provider. This is important.  Contact a health care provider if:  You have a fever.  Your edema starts suddenly or is getting worse, especially if you are pregnant or have a medical condition.  You have swelling in only one leg.  You have increased swelling and pain in your legs. Get help right away if:  You develop shortness of breath, especially when you are lying down.  You have pain in your chest or abdomen.  You feel weak.  You faint. This information is not intended to replace advice given to you by your health care provider. Make sure you discuss any questions you have with your health care provider. Document Released: 09/26/2004 Document Revised: 01/22/2016 Document Reviewed: 03/01/2015 Elsevier Interactive Patient Education  2018 [employer].   IF you received an x-Gardner today, you will  receive an invoice from [facility]. Please contact [facility] at [phone] with questions or concerns regarding your invoice.   IF you received labwork today, you will receive an invoice from [facility]. Please contact [facility] at [phone] with questions or concerns regarding your invoice.   Our billing staff will not be able to assist you with questions regarding bills from these companies.  You will be contacted with the lab results as soon as they are available. The fastest way to get your results is to activate your My Chart account. Instructions are located on the last page of this paperwork. If you have not heard from [place] regarding the results in 2 weeks, please contact this office.       I personally performed the services described in this documentation, which was scribed in my presence. The  recorded information has been reviewed and considered for accuracy and completeness, addended by me as needed, and agree with information above.  Signed,   Brenda Ray, MD [facility].  12/20/17 1:50 PM

## 2017-12-21 LAB — COMPREHENSIVE METABOLIC PANEL
ALK PHOS: 100 IU/L (ref 39–117)
ALT: 8 IU/L (ref 0–32)
AST: 18 IU/L (ref 0–40)
Albumin/Globulin Ratio: 1 — ABNORMAL LOW (ref 1.2–2.2)
Albumin: 3.1 g/dL — ABNORMAL LOW (ref 3.5–4.7)
BUN / CREAT RATIO: 19 (ref 12–28)
BUN: 12 mg/dL (ref 8–27)
Bilirubin Total: 0.3 mg/dL (ref 0.0–1.2)
CHLORIDE: 90 mmol/L — AB (ref 96–106)
CO2: 24 mmol/L (ref 20–29)
Calcium: 8.9 mg/dL (ref 8.7–10.3)
Creatinine, Ser: 0.62 mg/dL (ref 0.57–1.00)
GFR calc Af Amer: 97 mL/min/{1.73_m2} (ref >59)
GFR calc non Af Amer: 84 mL/min/{1.73_m2} (ref >59)
GLUCOSE: 84 mg/dL (ref 65–99)
Globulin, Total: 3 g/dL (ref 1.5–4.5)
Potassium: 4.6 mmol/L (ref 3.5–5.2)
Sodium: 127 mmol/L — ABNORMAL LOW (ref 134–144)
Total Protein: 6.1 g/dL (ref 6.0–8.5)

## 2017-12-21 LAB — PREALBUMIN: PREALBUMIN: 9 mg/dL (ref 9–32)

## 2017-12-22 ENCOUNTER — Encounter: Payer: Self-pay | Admitting: Pulmonary Disease

## 2017-12-22 ENCOUNTER — Ambulatory Visit: Payer: Medicare Other | Admitting: Pulmonary Disease

## 2017-12-22 VITALS — BP 122/70 | HR 88 | Ht 64.0 in | Wt 120.0 lb

## 2017-12-22 DIAGNOSIS — J449 Chronic obstructive pulmonary disease, unspecified: Secondary | ICD-10-CM | POA: Diagnosis not present

## 2017-12-22 DIAGNOSIS — I70201 Unspecified atherosclerosis of native arteries of extremities, right leg: Secondary | ICD-10-CM | POA: Diagnosis not present

## 2017-12-22 DIAGNOSIS — R911 Solitary pulmonary nodule: Secondary | ICD-10-CM | POA: Diagnosis not present

## 2017-12-22 MED ORDER — IPRATROPIUM-ALBUTEROL 0.5-2.5 (3) MG/3ML IN SOLN: 3.0000 mL | Freq: Four times a day (QID) | RESPIRATORY_TRACT | 6 refills | Status: AC | PRN

## 2017-12-22 NOTE — Patient Instructions (Signed)
Please continue supplemental oxygen Continue the antibiotic for lower extremity swelling and redness Will prescribe you a nebulizer and duo nebs to be used as needed Follow-up in 4-6 months.

## 2017-12-22 NOTE — Progress Notes (Signed)
Brenda Gardner    [id number]    Jan 13, 1933  Primary Care Physician:Patient, No Pcp Per  Referring Physician: Rutherford Guys, MD [address] [place], [place] [address]  Chief complaint: Consult for lung mass, hypoxia  HPI: 84 year old former smoker with history of tongue cancer.  lung cancer> PET positive left upper lobe nodule treated with radiotherapy in 2015, recent diagnosis of right upper lobe lung mass with mediastinal and right hilar lymphadenopathy. She underwent CT-guided biopsy on 12/01/17 with diagnosis of non-small cell lung cancer, squamous cell cancer.  She is being followed up with Dr. Julien Nordmann and Dr. Sondra Come with plan to start radiation therapy soon.  She is on supplemental oxygen which is started after her recent admission for dyspnea, acute hypoxic respiratory failure in March 2019.  Chest, secondary to the right upper lobe mass and postobstructive pneumonia.  Has chronic cough with white mucus.  Complains of right subscapular pain and was started on tramadol She also has swelling with erythema of her right lower extremity.  She has seen her primary care and is currently on Keflex. 30-pack-year smoking history.  Quit in March 2019.  Outpatient Encounter Medications as of 12/22/2017  Medication Sig  . benzonatate (TESSALON) 200 MG capsule Take 1 capsule (200 mg total) by mouth 3 (three) times daily as needed for cough.  . cephALEXin (KEFLEX) 500 MG capsule Take 1 capsule (500 mg total) by mouth 2 (two) times daily.  . collagenase (SANTYL) ointment Apply topically daily.  Marland Kitchen ENSURE PLUS (ENSURE PLUS) LIQD Take 237 mLs by mouth daily.  . naproxen (NAPROSYN) 250 MG tablet Take 250 mg by mouth daily as needed.  . traMADol (ULTRAM) 50 MG tablet Take 50 mg by mouth every 6 (six) hours as needed.   No facility-administered encounter medications on file as of 12/22/2017.     Allergies as of 12/22/2017 - Review Complete 12/22/2017  Allergen Reaction Noted  . Lisinopril  Swelling 08/12/2011    Past Medical History:  Diagnosis Date  . Anxiety    SINCE THIS IS COMING UP  . Arthritis   . Cancer ([place])   . Cataract   . Complication of anesthesia   . Hemochromatosis   . Hypertension   . Lesion of left lung    ONLY DOING RADIATION  . PONV (postoperative nausea and vomiting)    56 YRS AGO DURING CHILDBIRTH  . Radiation 03/15/14-03/28/14   left apical nodule 50 gray    Past Surgical History:  Procedure Laterality Date  . APPENDECTOMY    . EYE SURGERY     BIL CATARACTS  . GLOSSECTOMY Left 01/07/2014   Procedure: PARTIAL GLOSSECTOMY;  Surgeon: [name], MD;  Location: [facility];  Service: ENT;  Laterality: Left;  . IR THORACENTESIS ASP PLEURAL SPACE W/IMG GUIDE  11/25/2017  . RADICAL NECK DISSECTION Left 01/07/2014   Procedure: LEFT  NECK DISSECTION WITH FROZEN SECTION;  Surgeon: [name], MD;  Location: [facility];  Service: ENT;  Laterality: Left;  . TONSILLECTOMY    . TUBAL LIGATION    . VEIN LIGATION Bilateral 1980's    Family History  Problem Relation Age of Onset  . Mesothelioma Father     Social History   Socioeconomic History  . Marital status: Divorced    Spouse name: Not on file  . Number of children: 5  . Years of education: Not on file  . Highest education level: Not on file  Occupational History  .  Not on file  Social Needs  . Financial resource strain: Not on file  . Food insecurity:    Worry: Not on file    Inability: Not on file  . Transportation needs:    Medical: Not on file    Non-medical: Not on file  Tobacco Use  . Smoking status: Former Smoker    Packs/day: 0.50    Years: 25.00    Pack years: 12.50    Types: Cigarettes    Last attempt to quit: 11/24/2017    Years since quitting: 0.0  . Smokeless tobacco: Never Used  Substance and Sexual Activity  . Alcohol use: Yes    Alcohol/week: 3.5 oz    Types: 7 Standard drinks or equivalent per week  . Drug use: No  . Sexual activity: Not on file  Lifestyle  . Physical  activity:    Days per week: Not on file    Minutes per session: Not on file  . Stress: Not on file  Relationships  . Social connections:    Talks on phone: Not on file    Gets together: Not on file    Attends religious service: Not on file    Active member of club or organization: Not on file    Attends meetings of clubs or organizations: Not on file    Relationship status: Not on file  . Intimate partner violence:    Fear of current or ex partner: Not on file    Emotionally abused: Not on file    Physically abused: Not on file    Forced sexual activity: Not on file  Other Topics Concern  . Not on file  Social History Narrative  . Not on file    Review of systems: Review of Systems  Constitutional: Negative for fever and chills.  HENT: Negative.   Eyes: Negative for blurred vision.  Respiratory: as per HPI  Cardiovascular: Negative for chest pain and palpitations.  Gastrointestinal: Negative for vomiting, diarrhea, blood per rectum. Genitourinary: Negative for dysuria, urgency, frequency and hematuria.  Musculoskeletal: Negative for myalgias, back pain and joint pain.  Skin: Negative for itching and rash.  Neurological: Negative for dizziness, tremors, focal weakness, seizures and loss of consciousness.  Endo/Heme/Allergies: Negative for environmental allergies.  Psychiatric/Behavioral: Negative for depression, suicidal ideas and hallucinations.  All other systems reviewed and are negative.  Physical Exam: Blood pressure 122/70, pulse 88, height 5\' 4"  (1.626 m), weight 120 lb (54.4 kg), SpO2 99 %. Gen:      No acute distress HEENT:  EOMI, sclera anicteric Neck:     No masses; no thyromegaly Lungs:    Clear to auscultation bilaterally; normal respiratory effort CV:         Regular rate and rhythm; no murmurs Abd:      + bowel sounds; soft, non-tender; no palpable masses, no distension Ext:    Rt leg erythema, swelling, bullous lesion, superficial ulceration.  Skin:       Warm and dry; no rash Neuro: alert and oriented x 3 Psych: normal mood and affect  Data Reviewed: CT angio 11/25/17-6.9 cm right upper lobe mass, right hilar, paratracheal, subcarinal lymphadenopathy. PET scan 11/28/17- 1.6 cm FDG avid right upper lobe mass with associated mediastinal and hilar nodal metastasis.  Left adrenal metastasis. I reviewed the images personally  Assessment:  Dyspnea, hypoxia Likely has COPD based on his smoking history.   Continue supplemental oxygen, start nebulizer therapy with duo nebs as needed  Stage IIb squamous cell  cancer Scheduled to start radiation therapy.  Plan/Recommendations: -Continue supplemental oxygen, start duo nebs as needed  Marshell Garfinkel MD [facility] 12/22/2017, 2:21 PM  CC: Rutherford Guys, MD

## 2017-12-23 ENCOUNTER — Ambulatory Visit: Payer: Medicare Other | Admitting: Family Medicine

## 2017-12-23 ENCOUNTER — Other Ambulatory Visit: Payer: Self-pay

## 2017-12-23 ENCOUNTER — Encounter: Payer: Self-pay | Admitting: Family Medicine

## 2017-12-23 VITALS — BP 112/80 | HR 72 | Temp 98.4°F | Ht 64.0 in | Wt 120.0 lb

## 2017-12-23 DIAGNOSIS — F411 Generalized anxiety disorder: Secondary | ICD-10-CM

## 2017-12-23 DIAGNOSIS — M546 Pain in thoracic spine: Secondary | ICD-10-CM | POA: Diagnosis not present

## 2017-12-23 DIAGNOSIS — E871 Hypo-osmolality and hyponatremia: Secondary | ICD-10-CM

## 2017-12-23 DIAGNOSIS — C3491 Malignant neoplasm of unspecified part of right bronchus or lung: Secondary | ICD-10-CM

## 2017-12-23 DIAGNOSIS — R911 Solitary pulmonary nodule: Secondary | ICD-10-CM | POA: Diagnosis not present

## 2017-12-23 DIAGNOSIS — R600 Localized edema: Secondary | ICD-10-CM

## 2017-12-23 DIAGNOSIS — I70201 Unspecified atherosclerosis of native arteries of extremities, right leg: Secondary | ICD-10-CM | POA: Diagnosis not present

## 2017-12-23 DIAGNOSIS — J449 Chronic obstructive pulmonary disease, unspecified: Secondary | ICD-10-CM | POA: Diagnosis not present

## 2017-12-23 MED ORDER — HYDROXYZINE HCL 25 MG PO TABS: 25.0000 mg | ORAL_TABLET | Freq: Three times a day (TID) | ORAL | 0 refills | Status: AC | PRN

## 2017-12-23 MED ORDER — DOCUSATE SODIUM 100 MG PO CAPS: 100.0000 mg | ORAL_CAPSULE | Freq: Two times a day (BID) | ORAL | 0 refills | Status: AC

## 2017-12-23 MED ORDER — HYDROCODONE-ACETAMINOPHEN 5-325 MG PO TABS: 1.0000 | ORAL_TABLET | Freq: Four times a day (QID) | ORAL | 0 refills | Status: DC | PRN

## 2017-12-23 NOTE — Progress Notes (Signed)
4/23/20193:36 PM  Brenda Gardner 1933-06-16, 84 y.o. female [id number]  Chief Complaint  Patient presents with  . Follow-up    edema in both legs    HPI:   Patient is a 84 y.o. female with past medical history significant for metastatic lung cancer who presents today for lower extremity edema  She has meet with med onc and rad onc, has decided to do trial of palliative radiation She reports UE swelling resolved, but now having LE edema with some weeping She was seen on 4/20 for this started on keflex given mild leukocytosis and presentation She reports redness better, swelling not so much Reports they are a better in the morning Still spends most of her time sitting with legs down She otherwise has also developed sacral pressure ulcer, HH wound care treating. She just got eggshell sitting pad She is also starting to have pain along her right upper back, tramadol does not provide enough relief She is also experiencing anxiety at night, she does not want to lie flat. She is very adamant that this is not related to SOB. She reports she just feels as she loses control, unable to gather in her surroundings.   Labs done on 4/20, revealed stable anemia of chronic disease, FOBTIA was neg, low alb with low normal prealb. Worsening hyponatremia She did receive results and declined recommendation to be further evaluated in ER She has been drinking sports drink and increasing salt intake instead  Depression screen [facility] 2/9 12/23/2017 12/20/2017 12/02/2017  Decreased Interest 0 0 0  Down, Depressed, Hopeless 0 0 0  PHQ - 2 Score 0 0 0    Allergies  Allergen Reactions  . Lisinopril Swelling    Swelling of tongue    Prior to Admission medications   Medication Sig Start Date End Date Taking? Authorizing Provider  benzonatate (TESSALON) 200 MG capsule Take 1 capsule (200 mg total) by mouth 3 (three) times daily as needed for cough. 11/26/17  Yes Lacroce, Hulen Shouts, MD  cephALEXin (KEFLEX)  500 MG capsule Take 1 capsule (500 mg total) by mouth 2 (two) times daily. 12/20/17  Yes Brenda Agreste, MD  collagenase (SANTYL) ointment Apply topically daily. 11/27/17  Yes Lacroce, Hulen Shouts, MD  ENSURE PLUS (ENSURE PLUS) LIQD Take 237 mLs by mouth daily.   Yes [provider]  ipratropium-albuterol (DUONEB) 0.5-2.5 (3) MG/3ML SOLN Take 3 mLs by nebulization every 6 (six) hours as needed. Dx code:j44.9 12/22/17  Yes Mannam, Praveen, MD  naproxen (NAPROSYN) 250 MG tablet Take 250 mg by mouth daily as needed.   Yes [provider]  traMADol (ULTRAM) 50 MG tablet Take 50 mg by mouth every 6 (six) hours as needed.   Yes [provider]    Past Medical History:  Diagnosis Date  . Anxiety    SINCE THIS IS COMING UP  . Arthritis   . Cancer ([place])   . Cataract   . Complication of anesthesia   . Hemochromatosis   . Hypertension   . Lesion of left lung    ONLY DOING RADIATION  . PONV (postoperative nausea and vomiting)    54 YRS AGO DURING CHILDBIRTH  . Radiation 03/15/14-03/28/14   left apical nodule 50 gray    Past Surgical History:  Procedure Laterality Date  . APPENDECTOMY    . EYE SURGERY     BIL CATARACTS  . GLOSSECTOMY Left 01/07/2014   Procedure: PARTIAL GLOSSECTOMY;  Surgeon: Brenda Gala, MD;  Location: [facility]  OR;  Service: ENT;  Laterality: Left;  . IR THORACENTESIS ASP PLEURAL SPACE W/IMG GUIDE  11/25/2017  . RADICAL NECK DISSECTION Left 01/07/2014   Procedure: LEFT  NECK DISSECTION WITH FROZEN SECTION;  Surgeon: Brenda Gala, MD;  Location: [place];  Service: ENT;  Laterality: Left;  . TONSILLECTOMY    . TUBAL LIGATION    . VEIN LIGATION Bilateral 1980's    Social History   Tobacco Use  . Smoking status: Former Smoker    Packs/day: 0.50    Years: 25.00    Pack years: 12.50    Types: Cigarettes    Last attempt to quit: 11/24/2017    Years since quitting: 0.0  . Smokeless tobacco: Never Used  Substance Use Topics  . Alcohol use: Yes     Alcohol/week: 3.5 oz    Types: 7 Standard drinks or equivalent per week    Family History  Problem Relation Age of Onset  . Mesothelioma Father     Review of Systems  Constitutional: Negative for chills and fever.  Respiratory: Negative for cough and shortness of breath.   Cardiovascular: Positive for leg swelling. Negative for chest pain and palpitations.  Gastrointestinal: Negative for abdominal pain, nausea and vomiting.    OBJECTIVE:  Blood pressure 112/80, pulse 72, temperature 98.4 F (36.9 C), temperature source Oral, height 5\' 4"  (1.626 m), weight 120 lb (54.4 kg), SpO2 100 %.  Wt Readings from Last 3 Encounters:  12/23/17 120 lb (54.4 kg)  12/22/17 120 lb (54.4 kg)  12/20/17 119 lb (54 kg)    Physical Exam  Constitutional: She is oriented to person, place, and time.  Brenda Gardner, thin, chronically ill appearing  HENT:  Head: Normocephalic and atraumatic.  Mouth/Throat: Oropharynx is clear and moist. No oropharyngeal exudate.  Eyes: Pupils are equal, round, and reactive to light. Conjunctivae and EOM are normal. No scleral icterus.  Neck: Neck supple.  Cardiovascular: Normal rate, regular rhythm and normal heart sounds. Exam reveals no gallop and no friction rub.  No murmur heard. Pulmonary/Chest: Effort normal and breath sounds normal. She has no wheezes. She has no rales.  Musculoskeletal: She exhibits edema (bilateral legs with +3-4 pitting edema, erythema, no warmth, no abrasions but does have scattered areas of weeping seroud fluiid, ).  Neg homan sign No discrepancy in calf diameter  Neurological: She is alert and oriented to person, place, and time.  Skin: Skin is warm and dry.  Nursing note and vitals reviewed.    ASSESSMENT and PLAN 1. Primary malignant neoplasm of right lung metastatic to other site [facility]) Patient will start with palliative radiation, she is not interested in hospice at this time. Prescribing vicodin for improved pain control. New med  r/se/b reviewed. Cont Home health support given various nursing needs. - VAS [place] LOWER EXTREMITY VENOUS (DVT); Future  2. Bilateral leg edema Most likely due to poor oncotic pressure and being dependent. However patient with cancer and sedentary therefore will order duplex to r/o DVT. If negative will start compression with ace bandages, in the meanwhile cont with elevation as much as possible.   3. Hyponatremia Rechecking today. Agreed to cont above measures as long as sodium > 125. Discussed risks of hyponatremia.  - Basic Metabolic Panel  4. Bilateral thoracic back pain, unspecified chronicity At site of lung cancer, dc tramadol, staring vicodin. New med r/se/b reviewed. Adding docusate to help prevent constipation.  5. Anxiety state Will do trial of vistaril at home. No ssri given hyponatremia.  Other orders - HYDROcodone-acetaminophen (NORCO/VICODIN) 5-325 MG tablet; Take 1 tablet by mouth every 6 (six) hours as needed for moderate pain. - docusate sodium (COLACE) 100 MG capsule; Take 1 capsule (100 mg total) by mouth 2 (two) times daily. - hydrOXYzine (ATARAX/VISTARIL) 25 MG tablet; Take 1 tablet (25 mg total) by mouth 3 (three) times daily as needed. (Patient not taking: Reported on 12/24/2017)  Return in about 2 weeks (around 01/06/2018).    Rutherford Guys, MD [facility] [place], [place] [address] Ph.  [phone] Fax [phone]

## 2017-12-23 NOTE — Patient Instructions (Signed)
     IF you received an x-ray today, you will receive an invoice from [facility]. Please contact [facility] at [phone] with questions or concerns regarding your invoice.   IF you received labwork today, you will receive an invoice from [facility]. Please contact [facility] at [phone] with questions or concerns regarding your invoice.   Our billing staff will not be able to assist you with questions regarding bills from these companies.  You will be contacted with the lab results as soon as they are available. The fastest way to get your results is to activate your My Chart account. Instructions are located on the last page of this paperwork. If you have not heard from us regarding the results in 2 weeks, please contact this office.        IF you received an x-ray today, you will receive an invoice from [facility]. Please contact  Radiology at [phone] with questions or concerns regarding your invoice.   IF you received labwork today, you will receive an invoice from [facility]. Please contact [facility] at [phone] with questions or concerns regarding your invoice.   Our billing staff will not be able to assist you with questions regarding bills from these companies.  You will be contacted with the lab results as soon as they are available. The fastest way to get your results is to activate your My Chart account. Instructions are located on the last page of this paperwork. If you have not heard from us regarding the results in 2 weeks, please contact this office.     

## 2017-12-24 ENCOUNTER — Ambulatory Visit
Admission: RE | Admit: 2017-12-24 | Discharge: 2017-12-24 | Disposition: A | Discharge status: Home / Self Care | Payer: Medicare Other | Source: Ambulatory Visit | Attending: Radiation Oncology | Admitting: Radiation Oncology

## 2017-12-24 ENCOUNTER — Encounter: Payer: Self-pay | Admitting: Radiation Oncology

## 2017-12-24 ENCOUNTER — Other Ambulatory Visit: Payer: Self-pay

## 2017-12-24 VITALS — BP 135/61 | HR 76 | Temp 98.0°F | Resp 18

## 2017-12-24 DIAGNOSIS — R5383 Other fatigue: Secondary | ICD-10-CM | POA: Insufficient documentation

## 2017-12-24 DIAGNOSIS — R590 Localized enlarged lymph nodes: Secondary | ICD-10-CM | POA: Diagnosis not present

## 2017-12-24 DIAGNOSIS — I517 Cardiomegaly: Secondary | ICD-10-CM | POA: Diagnosis not present

## 2017-12-24 DIAGNOSIS — C7801 Secondary malignant neoplasm of right lung: Secondary | ICD-10-CM | POA: Insufficient documentation

## 2017-12-24 DIAGNOSIS — J90 Pleural effusion, not elsewhere classified: Secondary | ICD-10-CM | POA: Diagnosis not present

## 2017-12-24 DIAGNOSIS — R911 Solitary pulmonary nodule: Secondary | ICD-10-CM | POA: Insufficient documentation

## 2017-12-24 DIAGNOSIS — C3412 Malignant neoplasm of upper lobe, left bronchus or lung: Secondary | ICD-10-CM | POA: Diagnosis not present

## 2017-12-24 DIAGNOSIS — M7989 Other specified soft tissue disorders: Secondary | ICD-10-CM | POA: Insufficient documentation

## 2017-12-24 DIAGNOSIS — Z510 Encounter for antineoplastic radiation therapy: Secondary | ICD-10-CM | POA: Diagnosis not present

## 2017-12-24 DIAGNOSIS — C771 Secondary and unspecified malignant neoplasm of intrathoracic lymph nodes: Secondary | ICD-10-CM | POA: Diagnosis not present

## 2017-12-24 DIAGNOSIS — I313 Pericardial effusion (noninflammatory): Secondary | ICD-10-CM | POA: Diagnosis not present

## 2017-12-24 DIAGNOSIS — Z888 Allergy status to other drugs, medicaments and biological substances status: Secondary | ICD-10-CM | POA: Diagnosis not present

## 2017-12-24 DIAGNOSIS — Z79899 Other long term (current) drug therapy: Secondary | ICD-10-CM | POA: Diagnosis not present

## 2017-12-24 DIAGNOSIS — C3490 Malignant neoplasm of unspecified part of unspecified bronchus or lung: Secondary | ICD-10-CM | POA: Insufficient documentation

## 2017-12-24 DIAGNOSIS — Z923 Personal history of irradiation: Secondary | ICD-10-CM | POA: Diagnosis not present

## 2017-12-24 DIAGNOSIS — Z85118 Personal history of other malignant neoplasm of bronchus and lung: Secondary | ICD-10-CM | POA: Diagnosis not present

## 2017-12-24 DIAGNOSIS — Z9981 Dependence on supplemental oxygen: Secondary | ICD-10-CM | POA: Diagnosis not present

## 2017-12-24 DIAGNOSIS — J449 Chronic obstructive pulmonary disease, unspecified: Secondary | ICD-10-CM | POA: Insufficient documentation

## 2017-12-24 DIAGNOSIS — R918 Other nonspecific abnormal finding of lung field: Secondary | ICD-10-CM

## 2017-12-24 LAB — BASIC METABOLIC PANEL
BUN/Creatinine Ratio: 24 (ref 12–28)
BUN: 18 mg/dL (ref 8–27)
CO2: 26 mmol/L (ref 20–29)
Calcium: 9 mg/dL (ref 8.7–10.3)
Chloride: 90 mmol/L — ABNORMAL LOW (ref 96–106)
Creatinine, Ser: 0.74 mg/dL (ref 0.57–1.00)
GFR calc Af Amer: 87 mL/min/{1.73_m2} (ref >59)
GFR calc non Af Amer: 76 mL/min/{1.73_m2} (ref >59)
Glucose: 86 mg/dL (ref 65–99)
Potassium: 5 mmol/L (ref 3.5–5.2)
Sodium: 129 mmol/L — ABNORMAL LOW (ref 134–144)

## 2017-12-24 NOTE — Progress Notes (Signed)
Radiation Oncology         [phone] ________________________________  Name: Brenda Gardner MRN: [id number]  Date: 12/24/2017  DOB: 1933/02/07  Re-evaluation Note  CC: Patient, No Pcp Per  Curt Bears, MD    ICD-10-CM   1. Nodule of left lung R91.1     Diagnosis:   At least stage IIIb (T4, N2, M0/M1a) non-small cell lung cancer, favoring squamous cell carcinoma presenting in the right lung  Interval Since Last Radiation:  3 years, 9 months    Radiation treatment dates:   03/15/2014-03/28/2014  Site/dose:   Left apical nodule 50 gray in 10 fractions   Narrative:  The patient returns today for further evaluation. She is accompanied by her children today. She is followed by Medical Oncologist, Dr. Julien Nordmann and notes that she decided to undergo radiation, but no chemotherapy at this time.   On review of systems, she reports mild fatigue, decreased appetite, swelling to BLE, multiple small punctures to her RLE, weeping to the puncture site, and redness to her BLE.She is being treated with keflex, which she has been on for 4 days. Patient family member notes that the patient has difficulty with elevating her legs. She is on oxygen at this time and she has been on at home oxygen since 11/24/2017. Patient daughter notes that the patient is able to stand with assistance, but she often doesn't stand much on her own.  She denies breast pain, trouble swallowing, cough, and any other symptoms.                        ALLERGIES:  is allergic to lisinopril.  Meds: Current Outpatient Medications  Medication Sig Dispense Refill  . cephALEXin (KEFLEX) 500 MG capsule Take 1 capsule (500 mg total) by mouth 2 (two) times daily. 20 capsule 0  . collagenase (SANTYL) ointment Apply topically daily. 15 g 0  . docusate sodium (COLACE) 100 MG capsule Take 1 capsule (100 mg total) by mouth 2 (two) times daily. 10 capsule 0  . ENSURE PLUS (ENSURE PLUS) LIQD Take 237 mLs by mouth daily.    Marland Kitchen  HYDROcodone-acetaminophen (NORCO/VICODIN) 5-325 MG tablet Take 1 tablet by mouth every 6 (six) hours as needed for moderate pain. 30 tablet 0  . naproxen (NAPROSYN) 250 MG tablet Take 250 mg by mouth daily as needed.    . benzonatate (TESSALON) 200 MG capsule Take 1 capsule (200 mg total) by mouth 3 (three) times daily as needed for cough. (Patient not taking: Reported on 12/24/2017) 90 capsule 0  . hydrOXYzine (ATARAX/VISTARIL) 25 MG tablet Take 1 tablet (25 mg total) by mouth 3 (three) times daily as needed. (Patient not taking: Reported on 12/24/2017) 30 tablet 0  . ipratropium-albuterol (DUONEB) 0.5-2.5 (3) MG/3ML SOLN Take 3 mLs by nebulization every 6 (six) hours as needed. Dx code:j44.9 (Patient not taking: Reported on 12/24/2017) 360 mL 6   No current facility-administered medications for this encounter.     Physical Findings: The patient is in no acute distress. Patient is alert and oriented.  oral temperature is 98 F (36.7 C). Her blood pressure is 135/61 and her pulse is 76. Her respiration is 18 and oxygen saturation is 100%. .  Patient evaluated in wheelchair.   Extremities: Significant edema to BLE, with probable cellulitis noted to right leg and the patient is on abx for that issue.   Lab Findings: Lab Results  Component Value Date   WBC 12.2 (A) 12/20/2017  HGB 10.2 (A) 12/20/2017   HCT 30.8 (A) 12/20/2017   MCV 86.7 12/20/2017   PLT 333 12/04/2017    Radiographic Findings: Dg Chest 1 View  Result Date: 11/25/2017 CLINICAL DATA:  Status post thoracentesis. EXAM: CHEST  1 VIEW COMPARISON:  Chest x-ray 11/26/1998 19. FINDINGS: Mediastinum and right hilar prominence again noted consistent adenopathy. Right apical mass again noted. COPD. Stable left pleural effusion. Small right pleural effusion. No evidence of pneumothorax post thoracentesis. Stable cardiomegaly. IMPRESSION: No evidence of pneumothorax post thoracentesis Electronically Signed   By: Obion   On:  11/25/2017 12:33   X-ray Chest Pa And Lateral  Result Date: 11/25/2017 CLINICAL DATA:  Hypoxia EXAM: CHEST - 2 VIEW COMPARISON:  11/24/2017.  CT 11/04/2017. FINDINGS: Mediastinal and right hilar prominence again noted consistent adenopathy. Right upper lung mass again noted. Cardiomegaly with mild bilateral interstitial prominence and bilateral pleural effusions. No pneumothorax. No acute bony abnormality. IMPRESSION: 1. Mediastinal right hilar prominence again noted consistent adenopathy. Right upper lung mass again noted without interim change. 2. Cardiomegaly with mild bilateral interstitial prominence and bilateral pleural effusions suggesting mild CHF. Electronically Signed   By: Marcello Moores  Register   On: 11/25/2017 09:15   Dg Chest 2 View  Result Date: 11/24/2017 CLINICAL DATA:  Shortness of breath and cough EXAM: CHEST - 2 VIEW COMPARISON:  10/30/2017 plain film, 11/04/2017 CT of the chest FINDINGS: Known right upper lobe mass lesion is again identified. Right hilar adenopathy is noted. Right pleural effusion is noted which appears larger than that seen on recent CT. New small left pleural effusion is noted. The remainder of the left lung is clear. Cardiac shadow is within normal limits. IMPRESSION: Known right upper lobe lung mass with associated effusion. New left pleural effusion is noted. Electronically Signed   By: Inez Catalina M.D.   On: 11/24/2017 13:17   Ct Angio Chest Pe W Or Wo Contrast  Result Date: 11/25/2017 CLINICAL DATA:  Right upper lobe lung mass. Right thoracentesis earlier today. Current smoker. COPD. Dyspnea. EXAM: CT ANGIOGRAPHY CHEST WITH CONTRAST TECHNIQUE: Multidetector CT imaging of the chest was performed using the standard protocol during bolus administration of intravenous contrast. Multiplanar CT image reconstructions and MIPs were obtained to evaluate the vascular anatomy. CONTRAST:  132mL ISOVUE-370 IOPAMIDOL (ISOVUE-370) INJECTION 76% COMPARISON:  11/04/2017 chest CT.  FINDINGS: Cardiovascular: The study is high quality for the evaluation of pulmonary embolism. There are no filling defects in the central, lobar, segmental or subsegmental pulmonary artery branches to suggest acute pulmonary embolism. Atherosclerotic nonaneurysmal thoracic aorta. Top-normal caliber main pulmonary artery (3.0 cm diameter). Extrinsic near occlusion of the right upper lobe pulmonary arteries due to right hilar adenopathy. Normal heart size. Moderate pericardial effusion, increased. Three-vessel coronary atherosclerosis. Mediastinum/Nodes: No discrete thyroid nodules. Unremarkable esophagus. No axillary adenopathy. Bulky 3.8 cm right paratracheal node (series 6/image 57) increased from 3.6 cm on 11/04/2017. Newly mildly enlarged 1.1 cm subcarinal node (series 6/image 73). Bulky 3.4 cm right hilar node (series 6/image 67), stable. No left hilar adenopathy. Lungs/Pleura: No pneumothorax. Small dependent right pleural effusion, slightly decreased. New small dependent left pleural effusion. Moderate centrilobular emphysema with diffuse bronchial wall thickening. Mild dependent atelectasis in both lower lobes. Posterior right upper lobe 6.9 x 5.2 cm lung mass (series 7/image 29), previously 6.8 x 5.4 cm, stable. Stable minimal sharply marginated paramediastinal consolidation in apical left upper lobe (series 7/image 18), compatible with mild radiation fibrosis. No new significant pulmonary nodules. Upper abdomen: Probable nonobstructing 4  mm interpolar left renal stone. Musculoskeletal: No aggressive appearing focal osseous lesions. Moderate thoracic spondylosis. Review of the MIP images confirms the above findings. IMPRESSION: 1. No pulmonary embolism. 2. Moderate pericardial effusion, increased. 3. Small dependent bilateral pleural effusions, slightly decreased on the right and new on the left. 4. Stable 6.9 cm posterior right upper lobe lung mass compatible with primary bronchogenic carcinoma. 5. Stable  bulky right hilar adenopathy. Increased bulky right paratracheal adenopathy. New mild subcarinal adenopathy. Findings are most compatible with nodal metastatic disease. 6. Three-vessel coronary atherosclerosis. Aortic Atherosclerosis (ICD10-I70.0) and Emphysema (ICD10-J43.9). Electronically Signed   By: Ilona Sorrel M.D.   On: 11/25/2017 17:45   Mr Jeri Cos BD Contrast  Result Date: 11/25/2017 CLINICAL DATA:  84 y/o  F; history of lung cancer, for staging. EXAM: MRI HEAD WITHOUT AND WITH CONTRAST TECHNIQUE: Multiplanar, multiecho pulse sequences of the brain and surrounding structures were obtained without and with intravenous contrast. CONTRAST:  73mL MULTIHANCE GADOBENATE DIMEGLUMINE 529 MG/ML IV SOLN COMPARISON:  07/27/2009 CT head. FINDINGS: Brain: No acute infarction, hemorrhage, hydrocephalus, extra-axial collection or mass lesion. Patchy confluentnonspecific foci of T2 FLAIR hyperintense signal abnormality in subcortical and periventricular white matter are compatible withseverechronic microvascular ischemic changes for age. Advancedbrain parenchymal volume loss. No abnormal enhancement. Several punctate foci of susceptibility hypointensity present within the pons and right greater than left posterior hemispheres compatible hemosiderin deposition of chronic microhemorrhage. Small chronic lacunar infarct within the pons. Vascular: Normal flow voids. Skull and upper cervical spine: Normal marrow signal. Sinuses/Orbits: Negative. Other: Bilateral intra-ocular lens replacement. IMPRESSION: 1. No intracranial metastatic disease identified. 2. Severe chronic microvascular ischemic changes and age advanced parenchymal volume loss of the brain. Electronically Signed   By: Kristine Garbe M.D.   On: 11/25/2017 22:09   Nm Pet Image Initial (pi) Skull Base To Thigh  Result Date: 11/28/2017 CLINICAL DATA:  Initial treatment strategy for right lung mass. EXAM: NUCLEAR MEDICINE PET SKULL BASE TO THIGH  TECHNIQUE: 5.6 mCi F-18 FDG was injected intravenously. Full-ring PET imaging was performed from the skull base to thigh after the radiotracer. CT data was obtained and used for attenuation correction and anatomic localization. Fasting blood glucose: 98 mg/dl COMPARISON:  CTA chest dated 11/25/2017.  PET-CT dated 12/09/2013. FINDINGS: Mediastinal blood pool activity: SUV max 1.8 NECK: No hypermetabolic cervical lymphadenopathy. Incidental CT findings: none CHEST: 5.3 x 7.6 cm posterior right upper lobe mass (series 8/image 14), max SUV 11.7 with suspected central necrosis, compatible with primary bronchogenic neoplasm. Dominant 3.9 cm right paratracheal node (series 4/image 71), max SUV 13.9 with suspected central necrosis. 2.6 cm short axis right hilar node, max SUV 17.4. 10 mm short axis subcarinal node, max SUV 5.4. Small to moderate bilateral pleural effusions, right greater than left. Incidental CT findings: Ectasia of the ascending thoracic aorta, measuring 3.8 cm. Atherosclerotic calcifications of the aortic arch. Three vessel coronary atherosclerosis. Trace inferior pericardial fluid. ABDOMEN/PELVIS: No hypermetabolic abdominopelvic lymphadenopathy. 9 mm soft tissue nodule beneath the anterior abdominal wall (series 4/image 119), nonspecific but without appreciable hypermetabolism. Focal hypermetabolism beneath the left mid abdominal wall on PET image 127 is without CT correlate. No abnormal hypermetabolism in the liver, spleen, pancreas, or right adrenal gland. Possible 10 mm left adrenal nodule (series 4/image 113), equivocal. New hypermetabolism involving the left adrenal gland, max SUV 2.6. This combination of findings is concerning for early adrenal metastasis. Nodular hepatic contour, including an exophytic nodular lesion along the inferior aspect of the right hepatic lobe (series 4/image  130), chronic. No appreciable hypermetabolism. Focal hypermetabolism in the right mid/lower abdomen on PET image  141 is without CT correlate. Incidental CT findings: Atherosclerotic calcifications of the abdominal aorta and branch vessels. Sigmoid diverticulosis, without evidence of diverticulitis. SKELETON: No focal hypermetabolic activity to suggest skeletal metastasis. Incidental CT findings: Mild sclerosis involving the bilateral femoral heads. IMPRESSION: 7.6 cm posterior right upper lobe mass, compatible with primary bronchogenic neoplasm. Associated mediastinal and right hilar nodal metastases. Small to moderate bilateral pleural effusions, right greater than left. Suspected 10 mm left adrenal metastasis, although indeterminate. 9 mm soft tissue nodule beneath the anterior abdominal wall, nonspecific but without appreciable hypermetabolism. Attention on follow-up is suggested to exclude peritoneal disease. Electronically Signed   By: Julian Hy M.D.   On: 11/28/2017 13:51   Ct Biopsy  Result Date: 12/01/2017 CLINICAL DATA:  Large posterior right upper lobe lung mass. EXAM: CT GUIDED CORE BIOPSY OF RIGHT LUNG MASS ANESTHESIA/SEDATION: 0.5 mg IV Versed; 50 mcg IV Fentanyl Total Moderate Sedation Time:  20 minutes. The patient's level of consciousness and physiologic status were continuously monitored during the procedure by Radiology nursing. PROCEDURE: The procedure risks, benefits, and alternatives were explained to the patient. Questions regarding the procedure were encouraged and answered. The patient understands and consents to the procedure. The posterior right chest wall was prepped with chlorhexidine in a sterile fashion, and a sterile drape was applied covering the operative field. A sterile gown and sterile gloves were used for the procedure. Local anesthesia was provided with 1% Lidocaine. Initial CT was performed in a decubitus position with the right side down. After localizing a site along the posterior right chest wall, a 17 gauge needle was advanced into a right upper lobe lung mass. Coaxial 18  gauge core biopsy samples were obtained. Three total core biopsy samples were obtained. After the procedure, the Biosentry device was utilized in depositing a plug at the pleural entry site. Additional CT was then performed. COMPLICATIONS: None FINDINGS: Large mass again localizes to the posterior right upper lobe and measures nearly 8 cm in maximal diameter. Solid tissue was obtained. Post biopsy imaging shows no evidence of pneumothorax or surrounding hemorrhage. IMPRESSION: CT-guided core biopsy performed of a large right upper lobe lung mass measuring nearly 8 cm. Electronically Signed   By: Aletta Edouard M.D.   On: 12/01/2017 16:22   Ir Thoracentesis Asp Pleural Space W/img Guide  Result Date: 11/25/2017 INDICATION: Patient with bilateral pleural effusions. Request is made for diagnostic and therapeutic thoracentesis. EXAM: ULTRASOUND GUIDED DIAGNOSTIC AND THERAPEUTIC RIGHT THORACENTESIS MEDICATIONS: 10 mL 2% lidocaine COMPLICATIONS: None immediate. PROCEDURE: An ultrasound guided thoracentesis was thoroughly discussed with the patient and questions answered. The benefits, risks, alternatives and complications were also discussed. The patient understands and wishes to proceed with the procedure. Written consent was obtained. Ultrasound was performed to localize and mark an adequate pocket of fluid in the right chest. The area was then prepped and draped in the normal sterile fashion. 2% lidocaine was used for local anesthesia. Under ultrasound guidance a Safe-T-Centesis catheter was introduced. Thoracentesis was performed. The catheter was removed and a dressing applied. FINDINGS: A total of approximately 900 mL of amber fluid was removed. Samples were sent to the laboratory as requested by the clinical team. IMPRESSION: Successful ultrasound guided diagnostic and therapeutic right thoracentesis yielding 900 mL of pleural fluid. Read by: Brynda Greathouse PA-C Electronically Signed   By: Aletta Edouard M.D.    On: 11/25/2017 12:19  Impression:  At least stage IIIb (T4, N2, M0/M1a) non-small cell lung cancer, favoring squamous cell carcinoma presenting in the right lung  We discussed potential curative treatment versus palliative and she would like to undergo shorter palliative course of treatment. We discussed the general course of radiation, potential side effects, and toxicities with radiation with the patient and her children today. She is interested in this approach. A consent form was signed and placed in her chart today.  Plan:  CT simulation appointment today at 9 AM for palliative course of 3 weeks of radiation treatment. The patient is unable to tolerate this treatment and then we will received with hospice evaluation.    ____________________________________   Blair Promise, PhD, MD    This document serves as a record of services personally performed by Gery Pray, MD. It was created on his behalf by [facility], a trained medical scribe. The creation of this record is based on the scribe's personal observations and the provider's statements to them. This document has been checked and approved by the attending provider.

## 2017-12-24 NOTE — Progress Notes (Signed)
  Radiation Oncology         [phone] ________________________________  Name: Brenda Gardner MRN: [id number]  Date: 12/24/2017  DOB: 1933/08/17  SIMULATION AND TREATMENT PLANNING NOTE    ICD-10-CM   1. Mass of upper lobe of right lung R91.8     DIAGNOSIS:  At least stage IIIb (T4, N2, M0/M1a) non-small cell lung cancer, favoring squamous cell carcinomapresenting in the right lung  NARRATIVE:  The patient was brought to the [facility].  Identity was confirmed.  All relevant records and images related to the planned course of therapy were reviewed.  The patient freely provided informed written consent to proceed with treatment after reviewing the details related to the planned course of therapy. The consent form was witnessed and verified by the simulation staff.  Then, the patient was set-up in a stable reproducible  supine position for radiation therapy.  CT images were obtained.  Surface markings were placed.  The CT images were loaded into the planning software.  Then the target and avoidance structures were contoured.  Treatment planning then occurred.  The radiation prescription was entered and confirmed.  Then, I designed and supervised the construction of a total of 5 medically necessary complex treatment devices.  I have requested : 3D Simulation  I have requested a DVH of the following structures: GTV, Ptv, heart . lungs.  I have ordered:dose calc.  PLAN:  The patient will receive 35 Gy in 14 fractions.   On CT scan today, it was a noted a significant right pleural effusion. We will order a therapeutic thoracentesis.  -----------------------------------  Blair Promise, PhD, MD  This document serves as a record of services personally performed by Gery Pray, MD. It was created on his behalf by Steva Colder, a trained medical scribe. The creation of this record is based on the scribe's personal observations and the provider's statements to them. This document  has been checked and approved by the attending provider.

## 2017-12-24 NOTE — Progress Notes (Signed)
Brenda Gardner is here for a follow-up appointment.Patient denies any pain states that she has mild fatigue. Denies any difficulty with sob with oxygen on. Denies any difficulty with swallowing. Denies any coughing. States that her appetite is not that good.  Vitals:   12/24/17 0811  BP: 135/61  Pulse: 76  Resp: 18  Temp: 98 F (36.7 C)  TempSrc: Oral  SpO2: 100%   Wt Readings from Last 3 Encounters:  12/23/17 120 lb (54.4 kg)  12/22/17 120 lb (54.4 kg)  12/20/17 119 lb (54 kg)

## 2017-12-25 ENCOUNTER — Telehealth: Payer: Self-pay | Admitting: *Deleted

## 2017-12-25 NOTE — Telephone Encounter (Signed)
Oncology Nurse Navigator Documentation  Oncology Nurse Navigator Flowsheets 12/25/2017  Navigator Location [facility]  Navigator Encounter Type Telephone/I called and spoke with patient today.  I asked about her thoughts about receiving chemo.  Patient states she is not interested in chemo just the radiation. I listened as she explained. I will update Dr. Julien Nordmann and Dr. Sondra Come on patient's wishes.  Telephone Outgoing Call  Treatment Phase Pre-Tx/Tx Discussion  Barriers/Navigation Needs Coordination of Care  Interventions Coordination of Care  Coordination of Care Other  Acuity Level 2  Time Spent with Patient 30

## 2017-12-26 ENCOUNTER — Telehealth: Payer: Self-pay | Admitting: General Practice

## 2017-12-26 DIAGNOSIS — R0902 Hypoxemia: Secondary | ICD-10-CM | POA: Diagnosis not present

## 2017-12-26 DIAGNOSIS — I4891 Unspecified atrial fibrillation: Secondary | ICD-10-CM | POA: Diagnosis not present

## 2017-12-26 DIAGNOSIS — E43 Unspecified severe protein-calorie malnutrition: Secondary | ICD-10-CM | POA: Diagnosis not present

## 2017-12-26 DIAGNOSIS — Z9981 Dependence on supplemental oxygen: Secondary | ICD-10-CM | POA: Diagnosis not present

## 2017-12-26 DIAGNOSIS — I472 Ventricular tachycardia: Secondary | ICD-10-CM | POA: Diagnosis not present

## 2017-12-26 DIAGNOSIS — L97228 Non-pressure chronic ulcer of left calf with other specified severity: Secondary | ICD-10-CM | POA: Diagnosis not present

## 2017-12-26 DIAGNOSIS — Z85810 Personal history of malignant neoplasm of tongue: Secondary | ICD-10-CM | POA: Diagnosis not present

## 2017-12-26 DIAGNOSIS — Z4800 Encounter for change or removal of nonsurgical wound dressing: Secondary | ICD-10-CM | POA: Diagnosis not present

## 2017-12-26 DIAGNOSIS — L89152 Pressure ulcer of sacral region, stage 2: Secondary | ICD-10-CM | POA: Diagnosis not present

## 2017-12-26 DIAGNOSIS — J439 Emphysema, unspecified: Secondary | ICD-10-CM | POA: Diagnosis not present

## 2017-12-26 DIAGNOSIS — I10 Essential (primary) hypertension: Secondary | ICD-10-CM | POA: Diagnosis not present

## 2017-12-26 DIAGNOSIS — C3412 Malignant neoplasm of upper lobe, left bronchus or lung: Secondary | ICD-10-CM | POA: Diagnosis not present

## 2017-12-26 DIAGNOSIS — L97218 Non-pressure chronic ulcer of right calf with other specified severity: Secondary | ICD-10-CM | POA: Diagnosis not present

## 2017-12-26 DIAGNOSIS — I772 Rupture of artery: Secondary | ICD-10-CM | POA: Diagnosis not present

## 2017-12-26 DIAGNOSIS — L89523 Pressure ulcer of left ankle, stage 3: Secondary | ICD-10-CM | POA: Diagnosis not present

## 2017-12-26 DIAGNOSIS — J189 Pneumonia, unspecified organism: Secondary | ICD-10-CM | POA: Diagnosis not present

## 2017-12-26 DIAGNOSIS — R918 Other nonspecific abnormal finding of lung field: Secondary | ICD-10-CM | POA: Diagnosis not present

## 2017-12-26 DIAGNOSIS — J90 Pleural effusion, not elsewhere classified: Secondary | ICD-10-CM | POA: Diagnosis not present

## 2017-12-26 DIAGNOSIS — I700 Atherosclerosis of aorta: Secondary | ICD-10-CM | POA: Diagnosis not present

## 2017-12-26 DIAGNOSIS — R590 Localized enlarged lymph nodes: Secondary | ICD-10-CM | POA: Diagnosis not present

## 2017-12-26 NOTE — Telephone Encounter (Signed)
Glenard Haring, nurse from [facility] calling to report findings on pt during home visit. BP 105/80, which is out of her normal range but the pt is asymptomatic. Nurse states that pt feels cold to the touch but the temperature is 97.7.   +2 pitting edema noted in lower extremeties and nurse states that Dr. Pamella Pert is already aware. Glenard Haring is wanting to know if test are going to be down to rule out blood clots. Nurse states this has to be down before legs can be wrapped or compression stocking could be used. Glenard Haring also notes that legs are beginning to weep Glenard Haring can be contacted at [phone]. Glenard Haring is asking if she can not be reached that [place] daughter of the pt be contacted with further instructions. [name] can be contacted at [phone].

## 2017-12-28 DIAGNOSIS — R911 Solitary pulmonary nodule: Secondary | ICD-10-CM | POA: Diagnosis not present

## 2017-12-29 ENCOUNTER — Telehealth: Payer: Self-pay | Admitting: Lab

## 2017-12-29 ENCOUNTER — Encounter: Payer: Self-pay | Admitting: Family Medicine

## 2017-12-29 ENCOUNTER — Ambulatory Visit (HOSPITAL_COMMUNITY)
Admission: RE | Admit: 2017-12-29 | Discharge: 2017-12-29 | Disposition: A | Discharge status: Home / Self Care | Payer: Medicare Other | Source: Ambulatory Visit | Attending: Family Medicine | Admitting: Family Medicine

## 2017-12-29 ENCOUNTER — Telehealth: Payer: Self-pay | Admitting: Pulmonary Disease

## 2017-12-29 ENCOUNTER — Telehealth: Payer: Self-pay

## 2017-12-29 DIAGNOSIS — M546 Pain in thoracic spine: Secondary | ICD-10-CM | POA: Diagnosis not present

## 2017-12-29 DIAGNOSIS — C3491 Malignant neoplasm of unspecified part of right bronchus or lung: Secondary | ICD-10-CM | POA: Diagnosis not present

## 2017-12-29 DIAGNOSIS — C7801 Secondary malignant neoplasm of right lung: Secondary | ICD-10-CM | POA: Diagnosis not present

## 2017-12-29 DIAGNOSIS — C3412 Malignant neoplasm of upper lobe, left bronchus or lung: Secondary | ICD-10-CM | POA: Diagnosis not present

## 2017-12-29 DIAGNOSIS — R911 Solitary pulmonary nodule: Secondary | ICD-10-CM | POA: Diagnosis not present

## 2017-12-29 DIAGNOSIS — Z510 Encounter for antineoplastic radiation therapy: Secondary | ICD-10-CM | POA: Diagnosis not present

## 2017-12-29 DIAGNOSIS — M7989 Other specified soft tissue disorders: Secondary | ICD-10-CM | POA: Diagnosis not present

## 2017-12-29 NOTE — Telephone Encounter (Signed)
Received call from Lauren-CSW that pt/daughter were concerned that they had not heard from Palliative Care team. Clarified with Dr. Sondra Come that plan currently is for pt to have 3 weeks of radiation with palliative intent (not that Palliative Care was being consulted). Called pt's daughter Barnett Applebaum back and explained the difference, as well as told her Dr. Sondra Come was willing to meet with her and pt before her "[facility] and [place]" appt on 12/31/17 to answer any lingering questions/concerns. Daughter verbalized understanding and agreement.

## 2017-12-29 NOTE — Telephone Encounter (Signed)
Pt is scheduled to have U/S today at 2pm at [facility]. Spoke with pt's daughter and they are aware of appt. Pt's daughter [name] also mentioned that the home health nurse recommended doing an Ankle-brachial index (ABI) and wanted to know if this can be ordered. Please advise. Thanks!

## 2017-12-29 NOTE — Telephone Encounter (Signed)
Spoke with pt's daughter [name], states that last week pt received a neb machine from both [facility] and [facility].  Pt kept the neb/supplies from [facility] and declined the nebulizer from [facility].  Daughter wanted to make [place] aware.    I do not see where our office has ever ordered anything for this pt from [facility].  I apologized for the confusion.    Nothing further needed.

## 2017-12-29 NOTE — Progress Notes (Signed)
Bilateral lower extremity venous duplex has been completed. Negative for obvious evidence of DVT. A cystic structure is found in the right popliteal fossa measuring 1.3 cm high by 3.4 cm wide by 4.3 cm long. Results were faxed to Dr. Pamella Pert.  12/29/17 2:36 PM Brenda Gardner RVT

## 2017-12-29 NOTE — Telephone Encounter (Signed)
Please advise 

## 2017-12-29 NOTE — Telephone Encounter (Signed)
Copied from [place]. Topic: Quick Communication - See Telephone Encounter >> Dec 29, 2017 10:27 AM Ahmed Prima L wrote: CRM for notification. See Telephone encounter for: 12/29/17.  Patient's daughter called and said that Dr Pamella Pert was going to have a ultra sound done for edema on both legs. She said that she has not heard from anyone and was just wanting to follow up. Call back is [phone] [name])

## 2017-12-30 NOTE — Telephone Encounter (Signed)
Spoke with angel yesterday. Discussed compression with ace bandage and either abd pads or ectoderm for weeping areas, pending duplex results. Ok to involve wound care nurse as needed.   Duplex results available today, no DVT, incidental finding of popliteal cyst in right leg.   Melina Modena, patient's daughter. Communicated results and plan of care  Left VM with Glenard Haring communicating results and to proceed with plan of care as discussed yesterday.

## 2017-12-31 ENCOUNTER — Ambulatory Visit: Admission: RE | Admit: 2017-12-31 | Payer: Medicare Other | Source: Ambulatory Visit | Admitting: Radiation Oncology

## 2018-01-01 ENCOUNTER — Ambulatory Visit: Payer: Medicare Other

## 2018-01-01 ENCOUNTER — Encounter: Payer: Self-pay | Admitting: Thoracic Surgery (Cardiothoracic Vascular Surgery)

## 2018-01-01 DIAGNOSIS — R911 Solitary pulmonary nodule: Secondary | ICD-10-CM | POA: Diagnosis not present

## 2018-01-01 DIAGNOSIS — J449 Chronic obstructive pulmonary disease, unspecified: Secondary | ICD-10-CM | POA: Diagnosis not present

## 2018-01-01 DIAGNOSIS — L89322 Pressure ulcer of left buttock, stage 2: Secondary | ICD-10-CM | POA: Diagnosis not present

## 2018-01-01 DIAGNOSIS — I70201 Unspecified atherosclerosis of native arteries of extremities, right leg: Secondary | ICD-10-CM | POA: Diagnosis not present

## 2018-01-02 ENCOUNTER — Ambulatory Visit: Payer: Medicare Other

## 2018-01-02 ENCOUNTER — Other Ambulatory Visit: Payer: Self-pay | Admitting: Family Medicine

## 2018-01-02 ENCOUNTER — Telehealth: Payer: Self-pay | Admitting: Family Medicine

## 2018-01-02 DIAGNOSIS — I700 Atherosclerosis of aorta: Secondary | ICD-10-CM | POA: Diagnosis not present

## 2018-01-02 DIAGNOSIS — I772 Rupture of artery: Secondary | ICD-10-CM | POA: Diagnosis not present

## 2018-01-02 DIAGNOSIS — C3412 Malignant neoplasm of upper lobe, left bronchus or lung: Secondary | ICD-10-CM | POA: Diagnosis not present

## 2018-01-02 DIAGNOSIS — I472 Ventricular tachycardia: Secondary | ICD-10-CM | POA: Diagnosis not present

## 2018-01-02 DIAGNOSIS — L97228 Non-pressure chronic ulcer of left calf with other specified severity: Secondary | ICD-10-CM | POA: Diagnosis not present

## 2018-01-02 DIAGNOSIS — E43 Unspecified severe protein-calorie malnutrition: Secondary | ICD-10-CM | POA: Diagnosis not present

## 2018-01-02 DIAGNOSIS — L89152 Pressure ulcer of sacral region, stage 2: Secondary | ICD-10-CM | POA: Diagnosis not present

## 2018-01-02 DIAGNOSIS — I4891 Unspecified atrial fibrillation: Secondary | ICD-10-CM | POA: Diagnosis not present

## 2018-01-02 DIAGNOSIS — L97218 Non-pressure chronic ulcer of right calf with other specified severity: Secondary | ICD-10-CM | POA: Diagnosis not present

## 2018-01-02 DIAGNOSIS — R0902 Hypoxemia: Secondary | ICD-10-CM | POA: Diagnosis not present

## 2018-01-02 DIAGNOSIS — R590 Localized enlarged lymph nodes: Secondary | ICD-10-CM | POA: Diagnosis not present

## 2018-01-02 DIAGNOSIS — Z85810 Personal history of malignant neoplasm of tongue: Secondary | ICD-10-CM | POA: Diagnosis not present

## 2018-01-02 DIAGNOSIS — I10 Essential (primary) hypertension: Secondary | ICD-10-CM | POA: Diagnosis not present

## 2018-01-02 DIAGNOSIS — Z9981 Dependence on supplemental oxygen: Secondary | ICD-10-CM | POA: Diagnosis not present

## 2018-01-02 DIAGNOSIS — J189 Pneumonia, unspecified organism: Secondary | ICD-10-CM | POA: Diagnosis not present

## 2018-01-02 DIAGNOSIS — R918 Other nonspecific abnormal finding of lung field: Secondary | ICD-10-CM | POA: Diagnosis not present

## 2018-01-02 DIAGNOSIS — J439 Emphysema, unspecified: Secondary | ICD-10-CM | POA: Diagnosis not present

## 2018-01-02 DIAGNOSIS — Z4800 Encounter for change or removal of nonsurgical wound dressing: Secondary | ICD-10-CM | POA: Diagnosis not present

## 2018-01-02 DIAGNOSIS — L89523 Pressure ulcer of left ankle, stage 3: Secondary | ICD-10-CM | POA: Diagnosis not present

## 2018-01-02 DIAGNOSIS — J90 Pleural effusion, not elsewhere classified: Secondary | ICD-10-CM | POA: Diagnosis not present

## 2018-01-02 NOTE — Telephone Encounter (Signed)
Hydrocodone refill  Last OV:12/23/17 Last Refill:12/23/17 60 tab/0 refills Pharmacy: [facility] [place], [place] [phone] (Phone) [phone] (Fax)

## 2018-01-02 NOTE — Telephone Encounter (Signed)
Copied from [place]. Topic: Quick Communication - See Telephone Encounter >> Jan 02, 2018 11:50 AM Synthia Innocent wrote: CRM for notification. See Telephone encounter for: 01/02/18. Need clarify wound care dressing, exuderm or exoderm? Please advise

## 2018-01-02 NOTE — Telephone Encounter (Signed)
From provider note 12/30/17, "Spoke with angel yesterday. Discussed compression with ace bandage and either abd pads or ectoderm for weeping areas, pending duplex results. Ok to involve wound care nurse as needed."  Dr. Pamella Pert please advise.

## 2018-01-02 NOTE — Telephone Encounter (Signed)
Copied from [place]. Topic: Quick Communication - Rx Refill/Question >> Jan 02, 2018 11:47 AM Synthia Innocent wrote: Medication: HYDROcodone-acetaminophen (NORCO/VICODIN) 5-325 MG tablet  Has the patient contacted their pharmacy? No, [facility] Requesting (Agent: If no, request that the patient contact the pharmacy for the refill.) Preferred Pharmacy (with phone number or street name): [facility] on [address]: Please be advised that RX refills may take up to 3 business days. We ask that you follow-up with your pharmacy.

## 2018-01-05 ENCOUNTER — Other Ambulatory Visit: Payer: Self-pay | Admitting: Radiation Oncology

## 2018-01-05 ENCOUNTER — Ambulatory Visit
Admission: RE | Admit: 2018-01-05 | Discharge: 2018-01-05 | Disposition: A | Discharge status: Home / Self Care | Payer: Medicare Other | Source: Ambulatory Visit | Attending: Radiation Oncology | Admitting: Radiation Oncology

## 2018-01-05 DIAGNOSIS — C3412 Malignant neoplasm of upper lobe, left bronchus or lung: Secondary | ICD-10-CM | POA: Insufficient documentation

## 2018-01-05 DIAGNOSIS — Z510 Encounter for antineoplastic radiation therapy: Secondary | ICD-10-CM | POA: Diagnosis not present

## 2018-01-05 DIAGNOSIS — Z87891 Personal history of nicotine dependence: Secondary | ICD-10-CM | POA: Insufficient documentation

## 2018-01-05 DIAGNOSIS — R918 Other nonspecific abnormal finding of lung field: Secondary | ICD-10-CM

## 2018-01-05 DIAGNOSIS — I70201 Unspecified atherosclerosis of native arteries of extremities, right leg: Secondary | ICD-10-CM | POA: Diagnosis not present

## 2018-01-05 DIAGNOSIS — R911 Solitary pulmonary nodule: Secondary | ICD-10-CM | POA: Diagnosis not present

## 2018-01-05 MED ORDER — LORAZEPAM 0.5 MG PO TABS
0.5000 mg | ORAL_TABLET | Freq: Once | ORAL | Status: AC
  Administered 2018-01-05: 0.5 mg via ORAL
  Filled 2018-01-05: qty 1

## 2018-01-05 MED ORDER — LORAZEPAM 1 MG PO TABS: 1.0000 mg | ORAL_TABLET | Freq: Three times a day (TID) | ORAL | 0 refills | Status: AC

## 2018-01-05 MED ORDER — HYDROCODONE-ACETAMINOPHEN 5-325 MG PO TABS: 1.0000 | ORAL_TABLET | Freq: Four times a day (QID) | ORAL | 0 refills | Status: AC | PRN

## 2018-01-05 NOTE — Progress Notes (Signed)
Ms. Adkison was given ativan per patient request for anxiety before treatment. Dr. Sondra Come  Ordered 0 .5mg  ativan.

## 2018-01-06 ENCOUNTER — Ambulatory Visit
Admission: RE | Admit: 2018-01-06 | Discharge: 2018-01-06 | Disposition: A | Discharge status: Home / Self Care | Payer: Medicare Other | Source: Ambulatory Visit | Attending: Radiation Oncology | Admitting: Radiation Oncology

## 2018-01-06 ENCOUNTER — Other Ambulatory Visit: Payer: Self-pay | Admitting: Internal Medicine

## 2018-01-06 DIAGNOSIS — C3412 Malignant neoplasm of upper lobe, left bronchus or lung: Secondary | ICD-10-CM | POA: Diagnosis not present

## 2018-01-06 DIAGNOSIS — Z510 Encounter for antineoplastic radiation therapy: Secondary | ICD-10-CM | POA: Diagnosis not present

## 2018-01-06 DIAGNOSIS — R911 Solitary pulmonary nodule: Secondary | ICD-10-CM | POA: Diagnosis not present

## 2018-01-06 DIAGNOSIS — Z87891 Personal history of nicotine dependence: Secondary | ICD-10-CM | POA: Diagnosis not present

## 2018-01-06 NOTE — Telephone Encounter (Signed)
Spoke with Glenard Haring clarified exuderm.

## 2018-01-07 ENCOUNTER — Ambulatory Visit
Admission: RE | Admit: 2018-01-07 | Discharge: 2018-01-07 | Disposition: A | Discharge status: Home / Self Care | Payer: Medicare Other | Source: Ambulatory Visit | Attending: Radiation Oncology | Admitting: Radiation Oncology

## 2018-01-07 ENCOUNTER — Telehealth: Payer: Self-pay

## 2018-01-07 DIAGNOSIS — Z510 Encounter for antineoplastic radiation therapy: Secondary | ICD-10-CM | POA: Diagnosis not present

## 2018-01-07 DIAGNOSIS — R590 Localized enlarged lymph nodes: Secondary | ICD-10-CM | POA: Diagnosis not present

## 2018-01-07 DIAGNOSIS — J189 Pneumonia, unspecified organism: Secondary | ICD-10-CM | POA: Diagnosis not present

## 2018-01-07 DIAGNOSIS — J90 Pleural effusion, not elsewhere classified: Secondary | ICD-10-CM | POA: Diagnosis not present

## 2018-01-07 DIAGNOSIS — R918 Other nonspecific abnormal finding of lung field: Secondary | ICD-10-CM | POA: Diagnosis not present

## 2018-01-07 DIAGNOSIS — L89152 Pressure ulcer of sacral region, stage 2: Secondary | ICD-10-CM | POA: Diagnosis not present

## 2018-01-07 DIAGNOSIS — I700 Atherosclerosis of aorta: Secondary | ICD-10-CM | POA: Diagnosis not present

## 2018-01-07 DIAGNOSIS — Z4800 Encounter for change or removal of nonsurgical wound dressing: Secondary | ICD-10-CM | POA: Diagnosis not present

## 2018-01-07 DIAGNOSIS — I772 Rupture of artery: Secondary | ICD-10-CM | POA: Diagnosis not present

## 2018-01-07 DIAGNOSIS — R0902 Hypoxemia: Secondary | ICD-10-CM | POA: Diagnosis not present

## 2018-01-07 DIAGNOSIS — E43 Unspecified severe protein-calorie malnutrition: Secondary | ICD-10-CM | POA: Diagnosis not present

## 2018-01-07 DIAGNOSIS — I10 Essential (primary) hypertension: Secondary | ICD-10-CM | POA: Diagnosis not present

## 2018-01-07 DIAGNOSIS — C3412 Malignant neoplasm of upper lobe, left bronchus or lung: Secondary | ICD-10-CM | POA: Diagnosis not present

## 2018-01-07 DIAGNOSIS — L89523 Pressure ulcer of left ankle, stage 3: Secondary | ICD-10-CM | POA: Diagnosis not present

## 2018-01-07 DIAGNOSIS — Z87891 Personal history of nicotine dependence: Secondary | ICD-10-CM | POA: Diagnosis not present

## 2018-01-07 DIAGNOSIS — Z85810 Personal history of malignant neoplasm of tongue: Secondary | ICD-10-CM | POA: Diagnosis not present

## 2018-01-07 DIAGNOSIS — R911 Solitary pulmonary nodule: Secondary | ICD-10-CM | POA: Diagnosis not present

## 2018-01-07 DIAGNOSIS — J439 Emphysema, unspecified: Secondary | ICD-10-CM | POA: Diagnosis not present

## 2018-01-07 DIAGNOSIS — I472 Ventricular tachycardia: Secondary | ICD-10-CM | POA: Diagnosis not present

## 2018-01-07 DIAGNOSIS — L97218 Non-pressure chronic ulcer of right calf with other specified severity: Secondary | ICD-10-CM | POA: Diagnosis not present

## 2018-01-07 DIAGNOSIS — Z9981 Dependence on supplemental oxygen: Secondary | ICD-10-CM | POA: Diagnosis not present

## 2018-01-07 DIAGNOSIS — L97228 Non-pressure chronic ulcer of left calf with other specified severity: Secondary | ICD-10-CM | POA: Diagnosis not present

## 2018-01-07 DIAGNOSIS — I4891 Unspecified atrial fibrillation: Secondary | ICD-10-CM | POA: Diagnosis not present

## 2018-01-07 NOTE — Telephone Encounter (Signed)
Phone call placed to patient's daughter to offer to schedule a visit with Palliative Care. Visit scheduled for Friday 01/09/18

## 2018-01-08 ENCOUNTER — Ambulatory Visit: Payer: Medicare Other

## 2018-01-08 ENCOUNTER — Emergency Department (HOSPITAL_COMMUNITY): Payer: Medicare Other

## 2018-01-08 ENCOUNTER — Emergency Department (HOSPITAL_COMMUNITY)
Admission: EM | Admit: 2018-01-08 | Discharge: 2018-01-08 | Disposition: A | Discharge status: Home / Self Care | Payer: Medicare Other | Attending: Emergency Medicine | Admitting: Emergency Medicine

## 2018-01-08 ENCOUNTER — Encounter (HOSPITAL_COMMUNITY): Payer: Self-pay

## 2018-01-08 DIAGNOSIS — Z87891 Personal history of nicotine dependence: Secondary | ICD-10-CM | POA: Insufficient documentation

## 2018-01-08 DIAGNOSIS — Z85810 Personal history of malignant neoplasm of tongue: Secondary | ICD-10-CM | POA: Insufficient documentation

## 2018-01-08 DIAGNOSIS — M542 Cervicalgia: Secondary | ICD-10-CM | POA: Insufficient documentation

## 2018-01-08 DIAGNOSIS — R079 Chest pain, unspecified: Secondary | ICD-10-CM | POA: Diagnosis not present

## 2018-01-08 DIAGNOSIS — W01198A Fall on same level from slipping, tripping and stumbling with subsequent striking against other object, initial encounter: Secondary | ICD-10-CM | POA: Diagnosis not present

## 2018-01-08 DIAGNOSIS — S20219A Contusion of unspecified front wall of thorax, initial encounter: Secondary | ICD-10-CM | POA: Diagnosis not present

## 2018-01-08 DIAGNOSIS — Y92009 Unspecified place in unspecified non-institutional (private) residence as the place of occurrence of the external cause: Secondary | ICD-10-CM | POA: Diagnosis not present

## 2018-01-08 DIAGNOSIS — R000 Tachycardia, unspecified: Secondary | ICD-10-CM | POA: Diagnosis not present

## 2018-01-08 DIAGNOSIS — Y999 Unspecified external cause status: Secondary | ICD-10-CM | POA: Insufficient documentation

## 2018-01-08 DIAGNOSIS — Z79899 Other long term (current) drug therapy: Secondary | ICD-10-CM | POA: Diagnosis not present

## 2018-01-08 DIAGNOSIS — I10 Essential (primary) hypertension: Secondary | ICD-10-CM | POA: Diagnosis not present

## 2018-01-08 DIAGNOSIS — R0781 Pleurodynia: Secondary | ICD-10-CM | POA: Diagnosis not present

## 2018-01-08 DIAGNOSIS — S299XXA Unspecified injury of thorax, initial encounter: Secondary | ICD-10-CM | POA: Diagnosis not present

## 2018-01-08 DIAGNOSIS — S29001A Unspecified injury of muscle and tendon of front wall of thorax, initial encounter: Secondary | ICD-10-CM | POA: Diagnosis present

## 2018-01-08 DIAGNOSIS — S199XXA Unspecified injury of neck, initial encounter: Secondary | ICD-10-CM | POA: Diagnosis not present

## 2018-01-08 DIAGNOSIS — Y9389 Activity, other specified: Secondary | ICD-10-CM | POA: Diagnosis not present

## 2018-01-08 MED ORDER — KETOROLAC TROMETHAMINE 15 MG/ML IJ SOLN
7.5000 mg | Freq: Once | INTRAMUSCULAR | Status: AC
  Administered 2018-01-08: 7.5 mg via INTRAVENOUS
  Filled 2018-01-08: qty 1

## 2018-01-08 MED ORDER — MORPHINE SULFATE (PF) 4 MG/ML IV SOLN
4.0000 mg | Freq: Once | INTRAVENOUS | Status: AC
  Administered 2018-01-08: 4 mg via INTRAVENOUS
  Filled 2018-01-08: qty 1

## 2018-01-08 MED ORDER — FENTANYL CITRATE (PF) 100 MCG/2ML IJ SOLN
50.0000 ug | Freq: Once | INTRAMUSCULAR | Status: AC
  Administered 2018-01-08: 50 ug via INTRAVENOUS
  Filled 2018-01-08: qty 2

## 2018-01-08 MED ORDER — SODIUM CHLORIDE 0.9 % IV SOLN
INTRAVENOUS | Status: DC
  Administered 2018-01-08: 07:00:00 via INTRAVENOUS

## 2018-01-08 MED ORDER — LORAZEPAM 2 MG/ML IJ SOLN
0.5000 mg | Freq: Once | INTRAMUSCULAR | Status: AC
  Administered 2018-01-08: 0.5 mg via INTRAVENOUS
  Filled 2018-01-08: qty 1

## 2018-01-08 NOTE — ED Notes (Signed)
Bed: WHALD Expected date:  Expected time:  Means of arrival:  Comments: 

## 2018-01-08 NOTE — ED Notes (Signed)
Bed: SA63 Expected date:  Expected time:  Means of arrival:  Comments: EMS  84 yo female from home unwitnessed fall at home-no LOC-no blood thinners

## 2018-01-08 NOTE — ED Triage Notes (Signed)
Per EMS:  Pt is from home.  She was walking using a potty chair as a walker, when she tripped on it. Pt reports she landed on top of the railing, hitting her chest.  She denies hitting her head.  Skin tears on L wrist, left bicep, and bruising at both sites. She denies LOC. Pt c/o of generalized pain, and chest pain from impact. Applied towel for C-collar.

## 2018-01-08 NOTE — ED Provider Notes (Signed)
[facility] DEPT Provider Note   CSN: [id number] Arrival date & time: 01/08/18  9892     History   Chief Complaint Chief Complaint  Patient presents with  . Fall    HPI Brenda Gardner is a 84 y.o. female.  HPI   84 year old female presenting after fall.  She lost her balance shortly before arrival she fell forward.  Struck her anterior chest.  She has had severe persistent chest pain since then.  Denies any other injuries from this fall.  She is not anticoagulated.  Patient currently appears very anxious.  Her daughter is at bedside reports that this is not unusual for her.  Past Medical History:  Diagnosis Date  . Anxiety    SINCE THIS IS COMING UP  . Arthritis   . Cancer ([place])   . Cataract   . Complication of anesthesia   . Hemochromatosis   . Hypertension   . Lesion of left lung    ONLY DOING RADIATION  . PONV (postoperative nausea and vomiting)    54 YRS AGO DURING CHILDBIRTH  . Radiation 03/15/14-03/28/14   left apical nodule 50 gray    Patient Active Problem List   Diagnosis Date Noted  . Pressure injury of skin 11/25/2017  . Acute hypoxemic respiratory failure ([place]) 11/25/2017  . Atrial fibrillation ([place]) 11/25/2017  . Aortic atherosclerosis ([place]) 11/25/2017  . 3-vessel CAD 11/25/2017  . Mass of upper lobe of right lung 11/24/2017  . Protein-calorie malnutrition, severe ([place]) 01/09/2014  . Tongue cancer ([place]) 01/07/2014  . Hypertension 08/06/2013  . Tobacco abuse 08/06/2013  . Nodule of left lung 12/15/2012  . Weight loss 09/29/2012  . Cough 09/29/2012  . Hemochromatosis, hereditary ([place]) 08/14/2011    Past Surgical History:  Procedure Laterality Date  . APPENDECTOMY    . EYE SURGERY     BIL CATARACTS  . GLOSSECTOMY Left 01/07/2014   Procedure: PARTIAL GLOSSECTOMY;  Surgeon: [name], MD;  Location: [facility];  Service: ENT;  Laterality: Left;  . IR THORACENTESIS ASP PLEURAL SPACE W/IMG GUIDE  11/25/2017  . RADICAL NECK  DISSECTION Left 01/07/2014   Procedure: LEFT  NECK DISSECTION WITH FROZEN SECTION;  Surgeon: [name], MD;  Location: [facility];  Service: ENT;  Laterality: Left;  . TONSILLECTOMY    . TUBAL LIGATION    . VEIN LIGATION Bilateral 1980's     OB History   None      Home Medications    Prior to Admission medications   Medication Sig Start Date End Date Taking? Authorizing Provider  benzonatate (TESSALON) 200 MG capsule Take 1 capsule (200 mg total) by mouth 3 (three) times daily as needed for cough. Patient not taking: Reported on 12/24/2017 11/26/17   Melanee Spry, MD  cephALEXin (KEFLEX) 500 MG capsule Take 1 capsule (500 mg total) by mouth 2 (two) times daily. 12/20/17   Wendie Agreste, MD  collagenase (SANTYL) ointment Apply topically daily. 11/27/17   Lacroce, Hulen Shouts, MD  docusate sodium (COLACE) 100 MG capsule Take 1 capsule (100 mg total) by mouth 2 (two) times daily. 12/23/17   Rutherford Guys, MD  ENSURE PLUS (ENSURE PLUS) LIQD Take 237 mLs by mouth daily.    [provider]  HYDROcodone-acetaminophen (NORCO/VICODIN) 5-325 MG tablet Take 1 tablet by mouth every 6 (six) hours as needed for moderate pain. 01/05/18   Gery Pray, MD  hydrOXYzine (ATARAX/VISTARIL) 25 MG tablet Take 1 tablet (25 mg total) by mouth  3 (three) times daily as needed. Patient not taking: Reported on 12/24/2017 12/23/17   Rutherford Guys, MD  ipratropium-albuterol (DUONEB) 0.5-2.5 (3) MG/3ML SOLN Take 3 mLs by nebulization every 6 (six) hours as needed. Dx code:j44.9 Patient not taking: Reported on 12/24/2017 12/22/17   Marshell Garfinkel, MD  LORazepam (ATIVAN) 1 MG tablet Take 1 tablet (1 mg total) by mouth every 8 (eight) hours. 01/05/18   Gery Pray, MD  naproxen (NAPROSYN) 250 MG tablet Take 250 mg by mouth daily as needed.    [provider]    Family History Family History  Problem Relation Age of Onset  . Mesothelioma Father     Social History Social History   Tobacco  Use  . Smoking status: Former Smoker    Packs/day: 0.50    Years: 25.00    Pack years: 12.50    Types: Cigarettes    Last attempt to quit: 11/24/2017    Years since quitting: 0.1  . Smokeless tobacco: Never Used  Substance Use Topics  . Alcohol use: Yes    Alcohol/week: 3.5 oz    Types: 7 Standard drinks or equivalent per week  . Drug use: No     Allergies   Lisinopril   Review of Systems Review of Systems All systems reviewed and negative, other than as noted in HPI.   Physical Exam Updated Vital Signs BP 130/69 (BP Location: Right Arm)   Pulse 100   Temp 97.6 F (36.4 C) (Axillary)   Resp (!) 22   Ht 5\' 4"  (1.626 m)   Wt 54.4 kg (120 lb)   SpO2 90%   BMI 20.60 kg/m   Physical Exam  Constitutional: She appears well-developed and well-nourished. No distress.  Frail and chronically ill-appearing.  Appears somewhat anxious but not distressed.  HENT:  Head: Normocephalic and atraumatic.  Eyes: Conjunctivae are normal. Right eye exhibits no discharge. Left eye exhibits no discharge.  Neck: Neck supple.  Cardiovascular: Normal rate, regular rhythm and normal heart sounds. Exam reveals no gallop and no friction rub.  No murmur heard. Pulmonary/Chest: Effort normal and breath sounds normal. No respiratory distress. She exhibits tenderness.  Tenderness over sternum and right anterior chest.  No crepitus.  No overlying skin changes.  Breath sounds are symmetric bilaterally.  Abdominal: Soft. She exhibits no distension. There is no tenderness.  Musculoskeletal: She exhibits no edema or tenderness.  Neurological: She is alert.  Skin: Skin is warm and dry.  Psychiatric: She has a normal mood and affect. Her behavior is normal. Thought content normal.  Nursing note and vitals reviewed.    ED Treatments / Results  Labs (all labs ordered are listed, but only abnormal results are displayed) Labs Reviewed - No data to display  EKG None  Radiology No results found.    Dg Ribs Unilateral W/chest Right  Result Date: 01/08/2018 CLINICAL DATA:  Right rib pain after fall. EXAM: RIGHT RIBS AND CHEST - 3+ VIEW COMPARISON:  Radiograph and CT scan of November 25, 2017. FINDINGS: No fracture or other bone lesions are seen involving the ribs. Mild cardiomegaly is noted. Atherosclerosis of thoracic aorta is noted. Right basilar atelectasis is noted mild right pleural effusion. Stable right upper lobe lung mass. IMPRESSION: No evidence of right rib fracture. Right basilar atelectasis is noted with mild right pleural effusion. Stable right upper lobe lung mass. Aortic Atherosclerosis (ICD10-I70.0). Electronically Signed   By: Marijo Conception, M.D.   On: 01/08/2018 09:06   Dg Sternum  Result Date: 01/08/2018 CLINICAL DATA:  Chest pain after fall. EXAM: STERNUM - 2+ VIEW COMPARISON:  None. FINDINGS: There is no evidence of fracture or other focal bone lesions. IMPRESSION: No definite abnormality seen involving the sternum on lateral projection. Electronically Signed   By: Marijo Conception, M.D.   On: 01/08/2018 09:07   Ct Cervical Spine Wo Contrast  Result Date: 01/08/2018 CLINICAL DATA:  Recent fall with neck pain, known history of lung carcinoma EXAM: CT CERVICAL SPINE WITHOUT CONTRAST TECHNIQUE: Multidetector CT imaging of the cervical spine was performed without intravenous contrast. Multiplanar CT image reconstructions were also generated. COMPARISON:  11/25/2017 FINDINGS: Alignment: Loss of the normal cervical lordosis is noted. Degenerative anterolisthesis of C4 on C5 is noted. Skull base and vertebrae: 7 cervical segments are well visualized. Vertebral body height is well maintained. Multilevel facet hypertrophic changes are noted particularly on the left. No findings to suggest acute fracture or acute facet abnormality are noted. No definitive bony abnormality related to the known right upper lobe mass lesion is noted. Soft tissues and spinal canal: Soft tissues of the neck are  within normal limits. Large right upper lobe mass lesion is again seen similar to that noted on prior exams. Upper chest: Within normal limits with the exception of the previously mentioned right upper lobe mass. Other: None IMPRESSION: Multilevel degenerative change without acute bony abnormality. Changes consistent with the known right upper lobe mass lesion. Electronically Signed   By: Inez Catalina M.D.   On: 01/08/2018 08:45   Dg Chest Port 1 View  Result Date: 01/11/2018 CLINICAL DATA:  Shortness of breath for 1 day, history hypertension, cancer of the tongue, non-small cell carcinoma of the lung EXAM: PORTABLE CHEST 1 VIEW COMPARISON:  01/08/2018 FINDINGS: Enlargement of cardiac silhouette. Atherosclerotic calcification aorta. Emphysematous changes with persistent interstitial prominence. Bibasilar pleural effusions and atelectasis. Large area of opacity at the RIGHT apex is again identified corresponding to known RIGHT upper lobe pulmonary mass. No pneumothorax. Bones demineralized. IMPRESSION: RIGHT upper lobe mass consistent with known tumor. Enlargement of cardiac silhouette. Bibasilar pleural effusions and atelectasis. Persistent interstitial prominence. Electronically Signed   By: Lavonia Dana M.D.   On: 01/11/2018 08:25    Procedures Procedures (including critical care time)  Medications Ordered in ED Medications  0.9 %  sodium chloride infusion ( Intravenous New Bag/Given 01/08/18 0728)  fentaNYL (SUBLIMAZE) injection 50 mcg (50 mcg Intravenous Given 01/08/18 0730)  ketorolac (TORADOL) 15 MG/ML injection 7.5 mg (7.5 mg Intravenous Given 01/08/18 0729)     Initial Impression / Assessment and Plan / ED Course  I have reviewed the triage vital signs and the nursing notes.  Pertinent labs & imaging results that were available during my care of the patient were reviewed by me and considered in my medical decision making (see chart for details).     84 year old female with chest pain after  mechanical fall.  Imaging negative for fracture or other acute abnormality.  Symptoms improved after some pain medicine and Ativan.  Daughter at bedside.  Reviewed imaging studies with them.  Clinically this is chest wall contusion I suspect there is some component of anxiety as well.  Return precautions discussed.  Final Clinical Impressions(s) / ED Diagnoses   Final diagnoses:  Contusion of chest wall, unspecified laterality, initial encounter    ED Discharge Orders    None       Virgel Manifold, MD 01/14/18 1154

## 2018-01-09 ENCOUNTER — Telehealth: Payer: Self-pay | Admitting: Family Medicine

## 2018-01-09 ENCOUNTER — Ambulatory Visit: Payer: Medicare Other

## 2018-01-09 ENCOUNTER — Other Ambulatory Visit: Payer: Self-pay | Admitting: Internal Medicine

## 2018-01-09 DIAGNOSIS — R918 Other nonspecific abnormal finding of lung field: Secondary | ICD-10-CM

## 2018-01-09 DIAGNOSIS — R0609 Other forms of dyspnea: Principal | ICD-10-CM

## 2018-01-09 NOTE — Telephone Encounter (Signed)
Spoke with Stanton Kidney RN, patient when to ER last night, has decided no more ER no more radiation. Wants to do hospice. I agree patient is hospice appropriate, metastatic lung cancer. I  have given verbal orders for hospice orders. I will continue as her attending.

## 2018-01-09 NOTE — Telephone Encounter (Signed)
Copied from [place] [phone]. Topic: Quick Communication - See Telephone Encounter >> Jan 09, 2018 11:19 AM Aurelio Brash B wrote: CRM for notification. See Telephone encounter for: 01/09/18.  Stanton Kidney, NP, at [facility]  is asking if Dr Pamella Pert will :  1. refer pt to hospice,   2. if she agrees to stay attending for pt on hospice,  3.  and she will do hospice order set  Her contact number is [phone]

## 2018-01-09 NOTE — Telephone Encounter (Signed)
Please advise 

## 2018-01-10 NOTE — Progress Notes (Signed)
01/09/2018 PALLIATIVE CARE CONSULT Eura, Mccauslin DOB-05/18/2033. [place], [place] [address]. Lemmie Evens) [phone] REFERRING PROVIDER: Dr. Gery Pray  (rad oncology)/ Dr. Curt Bears (oncology) (O) [phone]. PCP: Dr. Lilia Argue. [place] [phone] ([place])  BILLABLE  ICD-10: [place] (R06.00) IMPRESSION/RECOMMENDATIONS: 1. Dyspnea r/t right posterior/upper lobe cancer; s/p tap of pleural effusion.  On home O2 2L since hospital discharge late March. Over last 24 hours progression in dyspnea so that marked SOB/anxiety 10 seconds after 2 person assist to stand; oxygen saturations decreasing to low 70's on 2L. Needing 10 min/increasing FiO2 to 4L before able to recover low 90's.  a. Continue FiO2 at 4L; adjust as needed to maintain sats low 90's.  b. Discussed with family need to limit mobility to bed bound/pivot to bedside recliner only, because of rapid decompensation with min. activity.  c. Would benefit from prn MSO4/low dose Ativan/bed side fan for symptoms dyspnea. 2. Bilateral LE swelling; R > L: Recent LE Doppler US neg for DVT.   a. Continue LE leg wraps/elevation as tolerated 3. Skin wounds: left upper extremity skin tear/tiny left outer ankle tiny stage one pressure injury/bottom pressure injury by family report (not evaluated as patient in recliner and unable to stand). S/P course of Keflex to cover for recent suspected RLE cellulitis.  a. Further wound care management per hospice recommendations. 4. Pain; right posterior shoulder shooting pain thought r/t  lung cancer managed with 5 tabs/day hydrocodone-acetaminophen 5-325mg . Also recent pain from anterior chest contusion sustained in recent fall  a. Further pain management per hospice recommendations. 5. Mobility: Rapid decline; PPS 60% 4 months earlier, now 30%.  Fall yesterday setting of premed benzo prior to radiation therapy. Currently a 2 person heavy assist to pivot. She is now essentially bedbound. Has hospital bed  and bedside commode.  6. MS changes due to pre-procedure benzo sedation (1mg  Ativan) before prior XRT therapy. Confusion/hallucinations/somnolence improving to baseline of A & O X 3. 7. Poor appetite: Current weight of 120lbs (baseline 107-110lbs) setting of LE edema. Eating bites to 25% of 2 small meals/day. 8. Advanced Care Planning/Care Management:  Patient and family wish to discontinue aggressive therapy for treatment of lung carcinoma. a. DNR forms reviewed and 2 left with family in home. Discussed placing one form on front of fridge; other to take with them  b. PCP Dr. Rutherford Guys consulted: she is agreeable for hospice referral/continuing on as attending physician/activation of standing Hospice Orders.  I spent 75 minutes providing this consultation (from 10am to 11:15am). More than 50% of that time was spent coordinating communication.  HPI: 84 yo female, with right lung non-small cell cancer, who refused chemo therapy, and now wishes to discontinue her recently initiated palliative radiation therapy due to increased fraility/fatigue/dyspnea. She had needed Ativan 1mg  prior to radiation treatments because of high anxiety/dyspnea, but suffered subsequent confusion/anxiety/somnolence from benzos which contributed to a near fall, then subsequent fall necessitating yesterday's ER visit for a chest contusion/skin tears.  Patient referred to Palliative Care for assistance with symptom management, help with coordination of resources, and ongoing discussions regarding goals of care/advanced directives. After much discussion, family/patient decision to discontinue radiation therapy and proceed with hospice referral. SH: Daughter [name] living in home with patent acting as PCG. Patient is divorced, but ex-husband lived in the home for terminal care; died Nov 11, 2022 of this year.   CODE STATUS: DNR; forms reviewed/signed and left in home PPS: 30%. (60% 4 months ago). Totally dependent for hygiene, mobility;  able to feed self.  HOSPICE ELIGIBILITY:  Referral sent today.  MEDICATIONS :  Keflex 500mg  bid (completed), Santyl 15g qd onandle wound, docusate (Colace) 100mg  bid, Ensure Plus 268ml qd, Hydocodone-actetaminophen 5-325 q6 prn, Naprosyn 240mg  prn, Tessalon 200mg  tid prn (not taking), hydroxyzine 25mg  tid prn (not taking), DuoNeb 0.5/2.5mg /52ml q6h prn (not taking), Lorazepam 1mg  before procedures. ALLERGIES: Lisinopril  PAST MEDICAL HISTORY: Non-small cell lung cancer (at least stabe IIIb) right posterior upper lobe lung (patient now choosing no further radiation therapy/or chemo initiation). Mediastinal lymphadenopathy. Right thoracentesis 11/25/2017 900 ml malignant pleural effusion.  H/O Stage 1 lung cancer left apical 10/2013 radiation therapy). h/o squamous cell carcinoma of the tongue(partial glossectomy with lymph node dissection 2015). Hemochromatosis, osteoarthritis.  PHYSICAL EXAM: Frail elderly female sitting up in recliner. Sleepy but pleasantly conversant. VS: BP 108/60, RR 24, 83% at rest, HR 84  HEENT: [place], AT LUNGS: Essentially clear but diminished; poor effort CARDIAC:  RRR; atrial gallop. No murmur/rub ABD: soft, non-distended. NABS EXTREMITIES: bilateral LE edema; non-weeping R>L. leg wraps in place.  SKIN: Left posterior UE skin tear approximated; no drainage/signs of infection or inflamation NEURO: Sleepy; A & 0 X 3  Violeta Gelinas, NP-C

## 2018-01-11 ENCOUNTER — Emergency Department (HOSPITAL_COMMUNITY)
Admission: EM | Admit: 2018-01-11 | Discharge: 2018-01-11 | Disposition: A | Discharge status: Home / Self Care | Attending: Emergency Medicine | Admitting: Emergency Medicine

## 2018-01-11 ENCOUNTER — Emergency Department (HOSPITAL_COMMUNITY)

## 2018-01-11 ENCOUNTER — Encounter (HOSPITAL_COMMUNITY): Payer: Self-pay | Admitting: Pharmacy Technician

## 2018-01-11 DIAGNOSIS — Z79899 Other long term (current) drug therapy: Secondary | ICD-10-CM | POA: Insufficient documentation

## 2018-01-11 DIAGNOSIS — J441 Chronic obstructive pulmonary disease with (acute) exacerbation: Secondary | ICD-10-CM | POA: Diagnosis not present

## 2018-01-11 DIAGNOSIS — F419 Anxiety disorder, unspecified: Secondary | ICD-10-CM | POA: Diagnosis not present

## 2018-01-11 DIAGNOSIS — I10 Essential (primary) hypertension: Secondary | ICD-10-CM | POA: Diagnosis not present

## 2018-01-11 DIAGNOSIS — Z515 Encounter for palliative care: Secondary | ICD-10-CM

## 2018-01-11 DIAGNOSIS — R000 Tachycardia, unspecified: Secondary | ICD-10-CM | POA: Diagnosis not present

## 2018-01-11 DIAGNOSIS — Z87891 Personal history of nicotine dependence: Secondary | ICD-10-CM | POA: Insufficient documentation

## 2018-01-11 DIAGNOSIS — C3411 Malignant neoplasm of upper lobe, right bronchus or lung: Secondary | ICD-10-CM | POA: Diagnosis not present

## 2018-01-11 DIAGNOSIS — C3491 Malignant neoplasm of unspecified part of right bronchus or lung: Secondary | ICD-10-CM | POA: Diagnosis not present

## 2018-01-11 DIAGNOSIS — I4891 Unspecified atrial fibrillation: Secondary | ICD-10-CM | POA: Insufficient documentation

## 2018-01-11 DIAGNOSIS — R0602 Shortness of breath: Secondary | ICD-10-CM | POA: Diagnosis not present

## 2018-01-11 DIAGNOSIS — R0603 Acute respiratory distress: Secondary | ICD-10-CM | POA: Diagnosis present

## 2018-01-11 LAB — CBC WITH DIFFERENTIAL/PLATELET
BASOS ABS: 0 10*3/uL (ref 0.0–0.1)
Basophils Relative: 0 %
EOS ABS: 0.2 10*3/uL (ref 0.0–0.7)
Eosinophils Relative: 1 %
HCT: 31.4 % — ABNORMAL LOW (ref 36.0–46.0)
HEMOGLOBIN: 10 g/dL — AB (ref 12.0–15.0)
LYMPHS ABS: 0.4 10*3/uL — AB (ref 0.7–4.0)
LYMPHS PCT: 3 %
MCH: 29.3 pg (ref 26.0–34.0)
MCHC: 31.8 g/dL (ref 30.0–36.0)
MCV: 92.1 fL (ref 78.0–100.0)
Monocytes Absolute: 1.1 10*3/uL — ABNORMAL HIGH (ref 0.1–1.0)
Monocytes Relative: 9 %
NEUTROS PCT: 87 %
Neutro Abs: 10.5 10*3/uL — ABNORMAL HIGH (ref 1.7–7.7)
PLATELETS: 225 10*3/uL (ref 150–400)
RBC: 3.41 MIL/uL — ABNORMAL LOW (ref 3.87–5.11)
RDW: 15.4 % (ref 11.5–15.5)
WBC: 12.2 10*3/uL — AB (ref 4.0–10.5)

## 2018-01-11 LAB — BASIC METABOLIC PANEL
Anion gap: 9 (ref 5–15)
BUN: 26 mg/dL — ABNORMAL HIGH (ref 6–20)
CHLORIDE: 102 mmol/L (ref 101–111)
CO2: 25 mmol/L (ref 22–32)
Calcium: 8.8 mg/dL — ABNORMAL LOW (ref 8.9–10.3)
Creatinine, Ser: 0.96 mg/dL (ref 0.44–1.00)
GFR calc Af Amer: >60 mL/min (ref >60)
GFR, EST NON AFRICAN AMERICAN: 54 mL/min — AB (ref >60)
Glucose, Bld: 116 mg/dL — ABNORMAL HIGH (ref 65–99)
POTASSIUM: 3.9 mmol/L (ref 3.5–5.1)
SODIUM: 136 mmol/L (ref 135–145)

## 2018-01-11 LAB — I-STAT ARTERIAL BLOOD GAS, ED
ACID-BASE DEFICIT: 2 mmol/L (ref 0.0–2.0)
Bicarbonate: 23.2 mmol/L (ref 20.0–28.0)
O2 SAT: 99 %
PO2 ART: 151 mmHg — AB (ref 83.0–108.0)
TCO2: 24 mmol/L (ref 22–32)
pCO2 arterial: 40.3 mmHg (ref 32.0–48.0)
pH, Arterial: 7.369 (ref 7.350–7.450)

## 2018-01-11 LAB — BRAIN NATRIURETIC PEPTIDE: B NATRIURETIC PEPTIDE 5: 838.5 pg/mL — AB (ref 0.0–100.0)

## 2018-01-11 LAB — I-STAT TROPONIN, ED: TROPONIN I, POC: 0.04 ng/mL (ref 0.00–0.08)

## 2018-01-11 MED ORDER — LORAZEPAM 2 MG/ML IJ SOLN
1.0000 mg | Freq: Once | INTRAMUSCULAR | Status: AC
  Administered 2018-01-11: 1 mg via INTRAVENOUS
  Filled 2018-01-11: qty 1

## 2018-01-11 MED ORDER — HYDROCODONE-ACETAMINOPHEN 5-325 MG PO TABS
1.0000 | ORAL_TABLET | Freq: Once | ORAL | Status: AC
  Administered 2018-01-11: 1 via ORAL
  Filled 2018-01-11: qty 1

## 2018-01-11 MED ORDER — MORPHINE SULFATE (PF) 4 MG/ML IV SOLN: 4.0000 mg | Freq: Once | INTRAVENOUS | Status: DC

## 2018-01-11 NOTE — ED Triage Notes (Signed)
Pt arrives via EMS with reports of sudden onset of difficulty breathing. Pt with cough onset last night. Pt 82% on normal 5L [place] with EMS. Placed on NRB at 7 with saturations 98-100%. ST at 130 with EMS.

## 2018-01-11 NOTE — Discharge Instructions (Addendum)
Call the hospice number when you get home and a nurse will come to your house today to get all of your medications.  Your primary hospice nurse will come out tomorrow.

## 2018-01-11 NOTE — ED Provider Notes (Signed)
[facility] EMERGENCY DEPARTMENT Provider Note   CSN: [id number] Arrival date & time: 01/11/18  0802     History   Chief Complaint Chief Complaint  Patient presents with  . Respiratory Distress    HPI Brenda Gardner is a 84 y.o. female.  Pt presents to the ED today with SOB.  Pt has a hx of right lung non-small cell cancer.  She had refused chemo.  Palliative radiation therapy was started on 5/8, but it made her feel worse, so she wished to discontinue that treatment.  Palliative care was consulted who came out to the home on 5/10.  A hospice consult was placed and the note said pt was made DNR, but there is no form with patient today.  Pt has been essentially bedbound for the past few months.  Pt's daughter said pt did not sleep well last night.  She woke up this morning very anxious and sob.   Daughter did give her 1 mg ativan sl which did not help.  The pt wears 5L [place] O2 normally and O2 sat was 82% on 5 L per EMS.  EMS put pt on a 100% NRB which has helped oxygen sats, but she remains very sob and anxious.     Past Medical History:  Diagnosis Date  . Anxiety    SINCE THIS IS COMING UP  . Arthritis   . Cancer ([place])   . Cataract   . Complication of anesthesia   . Hemochromatosis   . Hypertension   . Lesion of left lung    ONLY DOING RADIATION  . PONV (postoperative nausea and vomiting)    56 YRS AGO DURING CHILDBIRTH  . Radiation 03/15/14-03/28/14   left apical nodule 50 gray    Patient Active Problem List   Diagnosis Date Noted  . Pressure injury of skin 11/25/2017  . Acute hypoxemic respiratory failure ([place]) 11/25/2017  . Atrial fibrillation ([place]) 11/25/2017  . Aortic atherosclerosis ([place]) 11/25/2017  . 3-vessel CAD 11/25/2017  . Mass of upper lobe of right lung 11/24/2017  . Protein-calorie malnutrition, severe ([place]) 01/09/2014  . Tongue cancer ([place]) 01/07/2014  . Hypertension 08/06/2013  . Tobacco abuse 08/06/2013  . Nodule of left lung  12/15/2012  . Weight loss 09/29/2012  . Cough 09/29/2012  . Hemochromatosis, hereditary ([place]) 08/14/2011    Past Surgical History:  Procedure Laterality Date  . APPENDECTOMY    . EYE SURGERY     BIL CATARACTS  . GLOSSECTOMY Left 01/07/2014   Procedure: PARTIAL GLOSSECTOMY;  Surgeon: [name], MD;  Location: [place];  Service: ENT;  Laterality: Left;  . IR THORACENTESIS ASP PLEURAL SPACE W/IMG GUIDE  11/25/2017  . RADICAL NECK DISSECTION Left 01/07/2014   Procedure: LEFT  NECK DISSECTION WITH FROZEN SECTION;  Surgeon: [name], MD;  Location: [place];  Service: ENT;  Laterality: Left;  . TONSILLECTOMY    . TUBAL LIGATION    . VEIN LIGATION Bilateral 1980's     OB History   None      Home Medications    Prior to Admission medications   Medication Sig Start Date End Date Taking? Authorizing Provider  benzonatate (TESSALON) 200 MG capsule Take 1 capsule (200 mg total) by mouth 3 (three) times daily as needed for cough. 11/26/17  Yes Lacroce, Hulen Shouts, MD  cephALEXin (KEFLEX) 500 MG capsule Take 1 capsule (500 mg total) by mouth 2 (two) times daily. 12/20/17  Yes Wendie Agreste, MD  collagenase (  SANTYL) ointment Apply topically daily. 11/27/17  Yes Lacroce, Hulen Shouts, MD  docusate sodium (COLACE) 100 MG capsule Take 1 capsule (100 mg total) by mouth 2 (two) times daily. 12/23/17  Yes Rutherford Guys, MD  ENSURE PLUS (ENSURE PLUS) LIQD Take 237 mLs by mouth daily.   Yes [provider]  HYDROcodone-acetaminophen (NORCO/VICODIN) 5-325 MG tablet Take 1 tablet by mouth every 6 (six) hours as needed for moderate pain. 01/05/18  Yes Gery Pray, MD  ibuprofen (ADVIL,MOTRIN) 200 MG tablet Take 400 mg by mouth every 6 (six) hours as needed for fever or headache.   Yes [provider]  LORazepam (ATIVAN) 1 MG tablet Take 1 tablet (1 mg total) by mouth every 8 (eight) hours. 01/05/18  Yes Gery Pray, MD  Menthol-Zinc Oxide [facility] EX) Apply 2 application topically  daily.   Yes [provider]  naproxen (NAPROSYN) 250 MG tablet Take 250 mg by mouth daily as needed for headache.    Yes [provider]  hydrOXYzine (ATARAX/VISTARIL) 25 MG tablet Take 1 tablet (25 mg total) by mouth 3 (three) times daily as needed. Patient not taking: Reported on 12/24/2017 12/23/17   Rutherford Guys, MD  ipratropium-albuterol (DUONEB) 0.5-2.5 (3) MG/3ML SOLN Take 3 mLs by nebulization every 6 (six) hours as needed. Dx code:j44.9 Patient not taking: Reported on 12/24/2017 12/22/17   Marshell Garfinkel, MD    Family History Family History  Problem Relation Age of Onset  . Mesothelioma Father     Social History Social History   Tobacco Use  . Smoking status: Former Smoker    Packs/day: 0.50    Years: 25.00    Pack years: 12.50    Types: Cigarettes    Last attempt to quit: 11/24/2017    Years since quitting: 0.1  . Smokeless tobacco: Never Used  Substance Use Topics  . Alcohol use: Yes    Alcohol/week: 3.5 oz    Types: 7 Standard drinks or equivalent per week  . Drug use: No     Allergies   Lisinopril   Review of Systems Review of Systems  Respiratory: Positive for shortness of breath.   All other systems reviewed and are negative.    Physical Exam Updated Vital Signs BP 123/61   Pulse (!) 101   Temp (!) 97.4 F (36.3 C) (Axillary)   Resp (!) 21   Ht 5\' 4"  (1.626 m)   Wt 54.4 kg (120 lb)   SpO2 100%   BMI 20.60 kg/m   Physical Exam  Constitutional: She is oriented to person, place, and time. She appears distressed.  HENT:  Head: Normocephalic and atraumatic.  Right Ear: External ear normal.  Left Ear: External ear normal.  Nose: Nose normal.  Mouth/Throat: Oropharynx is clear and moist.  Eyes: Pupils are equal, round, and reactive to light. Conjunctivae and EOM are normal.  Neck: Normal range of motion. Neck supple.  Cardiovascular: Regular rhythm. Tachycardia present.  Pulmonary/Chest: Breath sounds normal. Accessory  muscle usage present. Tachypnea noted. She is in respiratory distress.  Abdominal: Soft. Bowel sounds are normal.  Musculoskeletal: Normal range of motion. She exhibits edema.  Neurological: She is alert and oriented to person, place, and time.  Skin: Skin is warm. Capillary refill takes less than 2 seconds.  Psychiatric: Her mood appears anxious.  Nursing note and vitals reviewed.    ED Treatments / Results  Labs (all labs ordered are listed, but only abnormal results are displayed) Labs Reviewed  BASIC METABOLIC  PANEL - Abnormal; Notable for the following components:      Result Value   Glucose, Bld 116 (*)    BUN 26 (*)    Calcium 8.8 (*)    GFR calc non Af Amer 54 (*)    All other components within normal limits  CBC WITH DIFFERENTIAL/PLATELET - Abnormal; Notable for the following components:   WBC 12.2 (*)    RBC 3.41 (*)    Hemoglobin 10.0 (*)    HCT 31.4 (*)    Neutro Abs 10.5 (*)    Lymphs Abs 0.4 (*)    Monocytes Absolute 1.1 (*)    All other components within normal limits  BRAIN NATRIURETIC PEPTIDE - Abnormal; Notable for the following components:   B Natriuretic Peptide 838.5 (*)    All other components within normal limits  I-STAT ARTERIAL BLOOD GAS, ED - Abnormal; Notable for the following components:   pO2, Arterial 151.0 (*)    All other components within normal limits  I-STAT TROPONIN, ED    EKG EKG Interpretation  Date/Time:  Sunday Jan 11 2018 08:10:40 EDT Ventricular Rate:  128 PR Interval:    QRS Duration: 90 QT Interval:  285 QTC Calculation: 416 R Axis:   7 Text Interpretation:  Sinus tachycardia Ventricular premature complex Consider anterior infarct Repolarization abnormality, prob rate related Confirmed by Isla Pence [phone]) on 01/11/2018 8:21:47 AM   Radiology Dg Chest Port 1 View  Result Date: 01/11/2018 CLINICAL DATA:  Shortness of breath for 1 day, history hypertension, cancer of the tongue, non-small cell carcinoma of the lung  EXAM: PORTABLE CHEST 1 VIEW COMPARISON:  01/08/2018 FINDINGS: Enlargement of cardiac silhouette. Atherosclerotic calcification aorta. Emphysematous changes with persistent interstitial prominence. Bibasilar pleural effusions and atelectasis. Large area of opacity at the RIGHT apex is again identified corresponding to known RIGHT upper lobe pulmonary mass. No pneumothorax. Bones demineralized. IMPRESSION: RIGHT upper lobe mass consistent with known tumor. Enlargement of cardiac silhouette. Bibasilar pleural effusions and atelectasis. Persistent interstitial prominence. Electronically Signed   By: Lavonia Dana M.D.   On: 01/11/2018 08:25    Procedures Procedures (including critical care time)  Medications Ordered in ED Medications  LORazepam (ATIVAN) injection 1 mg (1 mg Intravenous Given 01/11/18 8469)     Initial Impression / Assessment and Plan / ED Course  I have reviewed the triage vital signs and the nursing notes.  Pertinent labs & imaging results that were available during my care of the patient were reviewed by me and considered in my medical decision making (see chart for details).    CRITICAL CARE Performed by: Isla Pence   Total critical care time: 30 minutes  Critical care time was exclusive of separately billable procedures and treating other patients.  Critical care was necessary to treat or prevent imminent or life-threatening deterioration.  Critical care was time spent personally by me on the following activities: development of treatment plan with patient and/or surrogate as well as nursing, discussions with consultants, evaluation of patient's response to treatment, examination of patient, obtaining history from patient or surrogate, ordering and performing treatments and interventions, ordering and review of laboratory studies, ordering and review of radiographic studies, pulse oximetry and re-evaluation of patient's condition. Pt looks much more comfortable.  As she  is a hospice patient and has not wanted chemo or rad tx, I don't think a hospital admission would be beneficial.  The daughter said they signed up with hospice yesterday, but a nurse has not yet come out  to set them up with all the meds.  I called the hospice nurse on call and she will send a nurse out to pt's house today so the daughters have medications to give.  The pt and family are comfortable with this plan.  Pt knows she can return at any time.  Final Clinical Impressions(s) / ED Diagnoses   Final diagnoses:  COPD exacerbation ([place])  Malignant neoplasm of upper lobe of right lung [facility])  Hospice care  Anxiety    ED Discharge Orders    None       Isla Pence, MD 01/11/18 1052

## 2018-01-11 NOTE — ED Notes (Signed)
Pt/family verbalized understanding of discharge instructions.   

## 2018-01-12 ENCOUNTER — Ambulatory Visit: Payer: Medicare Other

## 2018-01-13 ENCOUNTER — Ambulatory Visit: Payer: Medicare Other

## 2018-01-14 ENCOUNTER — Ambulatory Visit: Payer: Medicare Other

## 2018-01-15 ENCOUNTER — Ambulatory Visit: Payer: Medicare Other

## 2018-01-15 ENCOUNTER — Encounter: Payer: Self-pay | Admitting: Radiation Oncology

## 2018-01-15 NOTE — Progress Notes (Signed)
  Radiation Oncology         [phone] ________________________________  Name: Brenda Gardner MRN: [id number]  Date: 01/15/2018  DOB: June 20, 1933  End of Treatment Note  Diagnosis:   At least stage IIIb (T4, N2, M0/M1a) non-small cell lung cancer, favoring squamous cell carcinomapresenting in the right lung.     Indication for treatment:  Palliative       Radiation treatment dates:   01/06/2018  Site/dose:   Scheduled to have 35 Gy directed to the Right Lung in 14 fractions, only received one dose of 2.5 Gy  Beams/energy:   Photon // 3D, 10X  Narrative:  After her first treatment, patient had a significant fall at home and decided to discontinue treatment.  She enrolled in hospice care.  Plan: when necessary follow-up in radiation oncology  -----------------------------------  Blair Promise, PhD, MD  This document serves as a record of services personally performed by Gery Pray MD. It was created on his behalf by Delton Coombes, a trained medical scribe. The creation of this record is based on the scribe's personal observations and the provider's statements to them.

## 2018-01-16 ENCOUNTER — Ambulatory Visit: Payer: Medicare Other

## 2018-01-16 ENCOUNTER — Telehealth: Payer: Self-pay

## 2018-01-16 NOTE — Telephone Encounter (Signed)
Forms faxed back to [facility]

## 2018-01-16 NOTE — Telephone Encounter (Signed)
Copied from [facility] [phone]. Topic: General - Other >> Jan 15, 2018  1:43 PM Synthia Innocent wrote: Reason for CRM: Hospice and Palliative Care calling, FYI patient has been transferred to [facility] inpatient care.   >> Jan 15, 2018  2:31 PM Tye Savoy wrote: Rosebud Poles

## 2018-01-19 ENCOUNTER — Ambulatory Visit: Payer: Medicare Other

## 2018-01-20 ENCOUNTER — Ambulatory Visit: Payer: Medicare Other

## 2018-01-21 ENCOUNTER — Ambulatory Visit: Payer: Medicare Other

## 2018-01-22 ENCOUNTER — Ambulatory Visit: Payer: Medicare Other

## 2018-01-23 ENCOUNTER — Ambulatory Visit: Payer: Medicare Other

## 2018-01-26 ENCOUNTER — Ambulatory Visit: Payer: Medicare Other

## 2018-01-27 ENCOUNTER — Ambulatory Visit: Payer: Medicare Other

## 2018-01-31 DEATH — deceased

## 2020-02-29 IMAGING — US IR THORACENTESIS ASP PLEURAL SPACE W/IMG GUIDE
1 series · 3 of 3 positions shown · non-contrast
Comparison: none

INDICATION: Patient with bilateral pleural effusions. Request is made for
diagnostic and therapeutic thoracentesis.

[Series 1: ir (id) (id)/(id)/(id) ir · 3 of 3 slices shown]
[im 1/3]
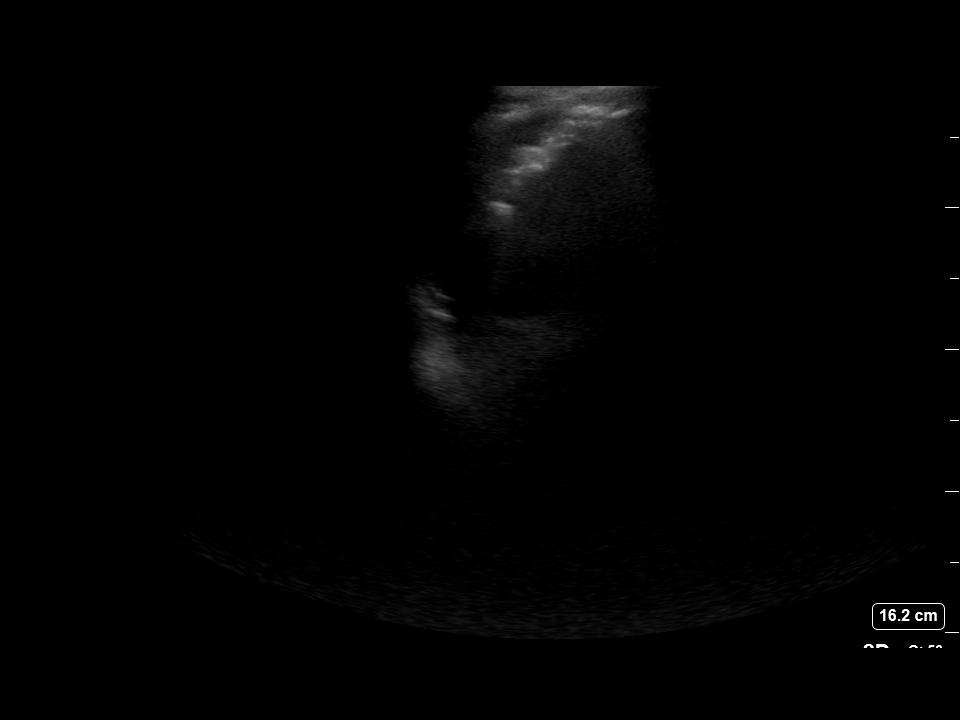
[im 2/3]
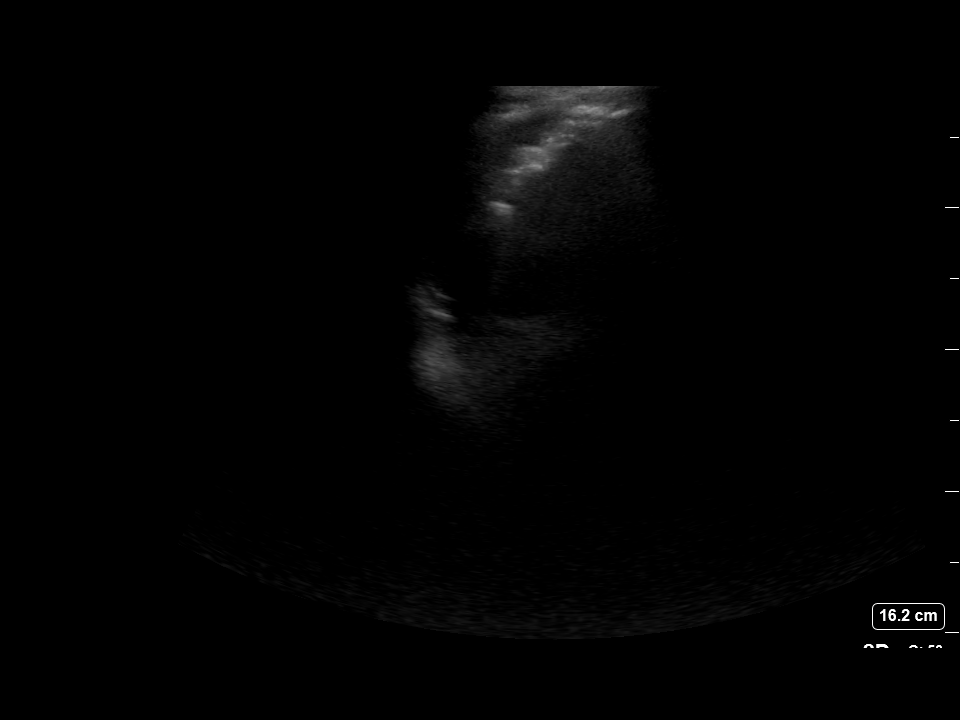
[im 3/3]
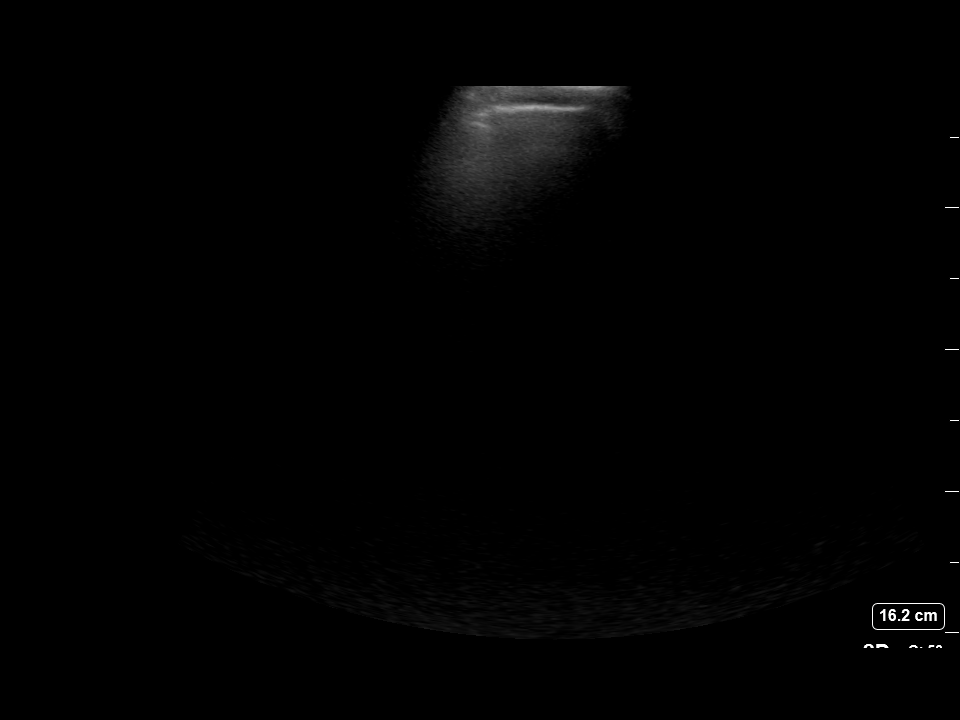

[3 of 3 positions shown; findings below may reference images not displayed]

EXAM:
ULTRASOUND GUIDED DIAGNOSTIC AND THERAPEUTIC RIGHT THORACENTESIS

MEDICATIONS:
10 mL 2% lidocaine

COMPLICATIONS:
None immediate.

PROCEDURE:
An ultrasound guided thoracentesis was thoroughly discussed with the
patient and questions answered. The benefits, risks, alternatives
and complications were also discussed. The patient understands and
wishes to proceed with the procedure. Written consent was obtained.

Ultrasound was performed to localize and mark an adequate pocket of
fluid in the right chest. The area was then prepped and draped in
the normal sterile fashion. 2% lidocaine was used for local
anesthesia. Under ultrasound guidance a Safe-T-Centesis catheter was
introduced. Thoracentesis was performed. The catheter was removed
and a dressing applied.
FINDINGS: A total of approximately 900 mL of amber fluid was removed. Samples
were sent to the laboratory as requested by the clinical team.
IMPRESSION: Successful ultrasound guided diagnostic and therapeutic right
thoracentesis yielding 900 mL of pleural fluid.

## 2020-03-03 IMAGING — CT NM PET TUM IMG INITIAL (PI) SKULL BASE T - THIGH
8 series · 25 of 25 positions shown · non-contrast
Comparison: CTA chest dated 11/25/2017.  PET-CT dated 12/09/2013.

CLINICAL DATA: Initial treatment strategy for right lung mass.

EXAM:
NUCLEAR MEDICINE PET SKULL BASE TO THIGH
TECHNIQUE: 5.6 mCi F-18 FDG was injected intravenously. Full-ring PET imaging
was performed from the skull base to thigh after the radiotracer. CT
data was obtained and used for attenuation correction and anatomic
localization.
Fasting blood glucose: 98 mg/dl

[Series 3: pet sk_thigh ac · axial · 5.0mm · 4.07mm/px · z∈[-884,-48]mm · 5 of 210 slices shown]
[im 1/210]
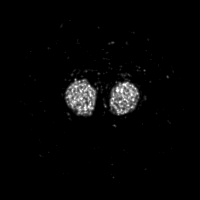
[im 53/210]
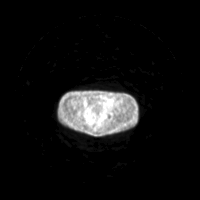
[im 105/210]
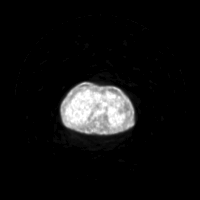
[im 157/210]
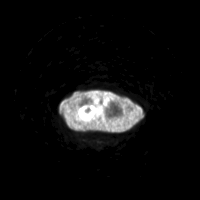
[im 210/210]
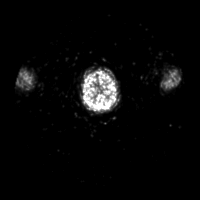

[Series 4: ct sk_thigh 5.0 b31f · axial · 5.0mm · 0.98mm/px · z∈[-884,-48]mm · 5 of 210 slices shown]
[im 1/210]
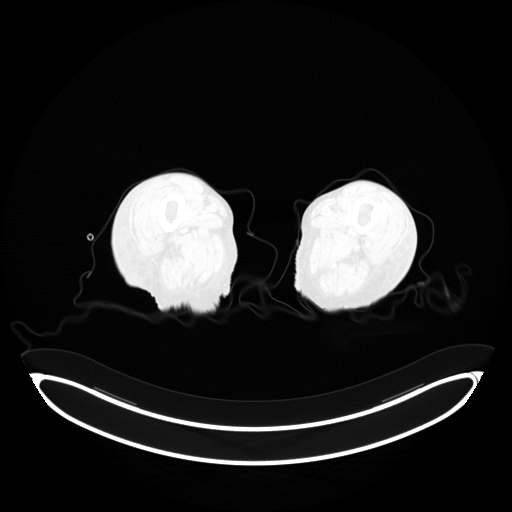
[im 53/210]
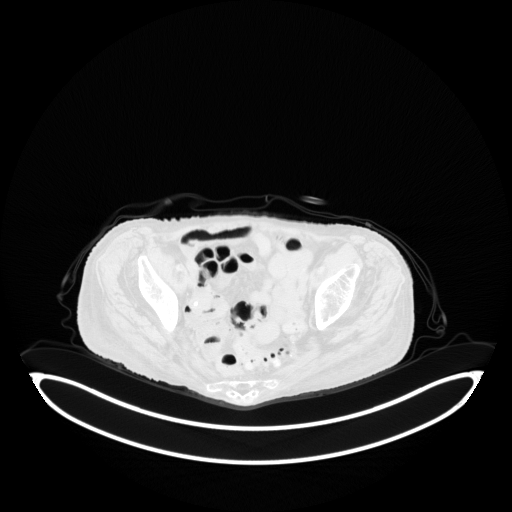
[im 105/210]
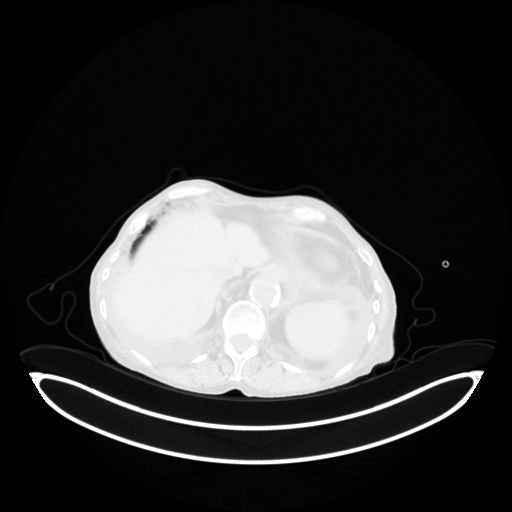
[im 157/210]
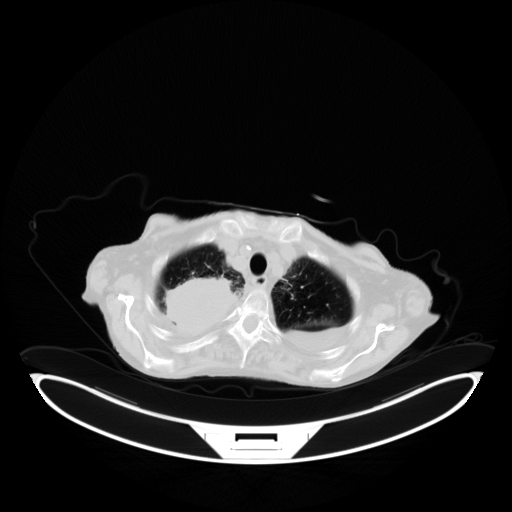
[im 210/210  brain]
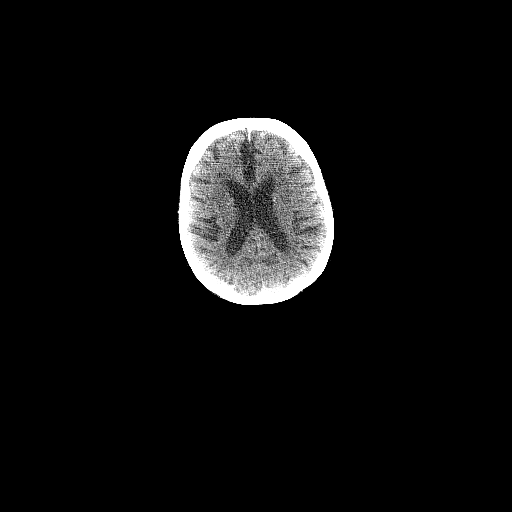

[Series 5: pet sk_thigh nac · axial · 5.0mm · 4.07mm/px · z∈[-884,-48]mm · 5 of 210 slices shown]
[im 1/210]
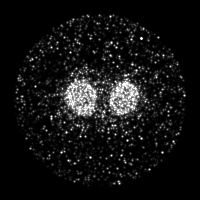
[im 53/210]
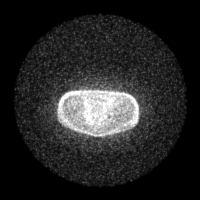
[im 105/210]
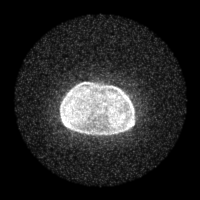
[im 157/210]
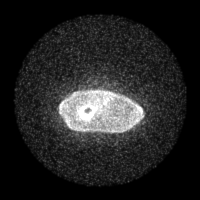
[im 210/210]
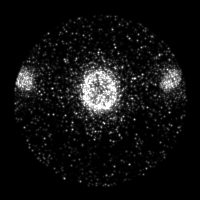

[Series 8: ct sk_thigh 5.0 (id) lung_bone · axial · 5.0mm · 0.61mm/px · z∈[-480,-212]mm · 2 of 68 slices shown]
[im 1/68  bone]
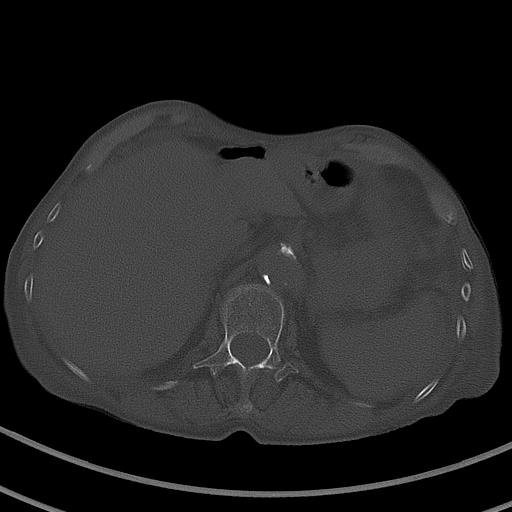
[im 68/68  bone]
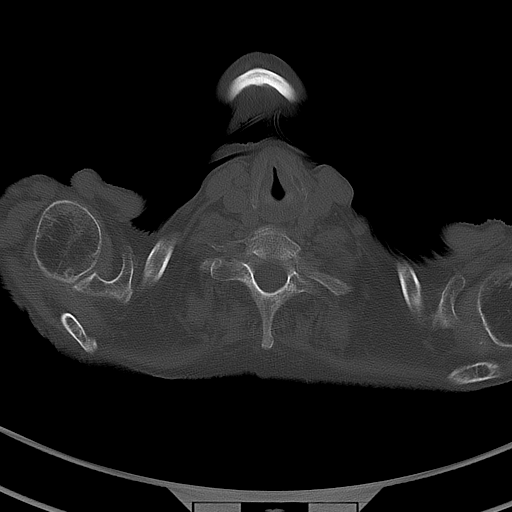

[Series 603: range-ct sk_thigh 5.0 (id)<alpha range> · 1 of 59 slices shown (1 of 2)]
[im 1/59]
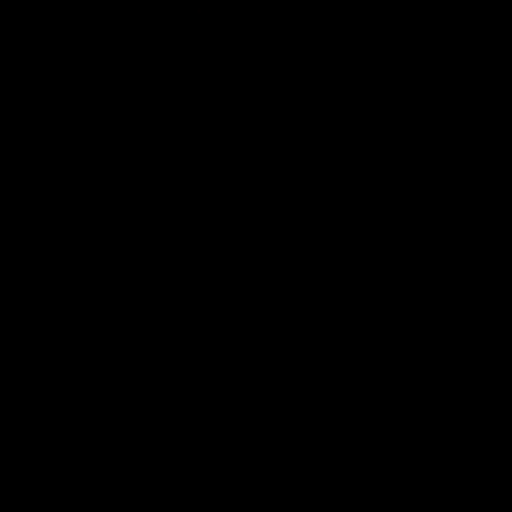

[Series 604: mip range 2 · coronal · 1.74mm/px · 1 of 32 slices shown]
[im 1/32]
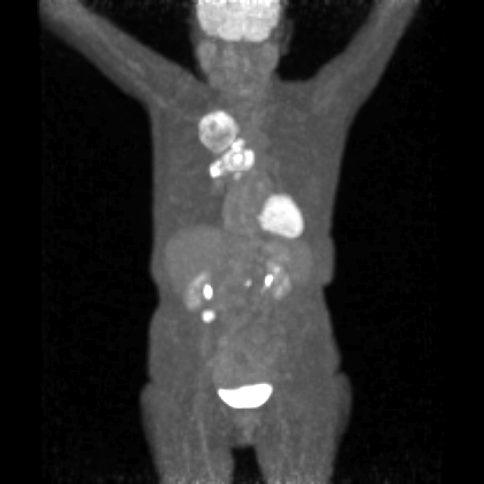

[Series 605: range-ct sk_thigh 5.0 (id)<alpha range> · 5 of 197 slices shown (2 of 2)]
[im 1/197]
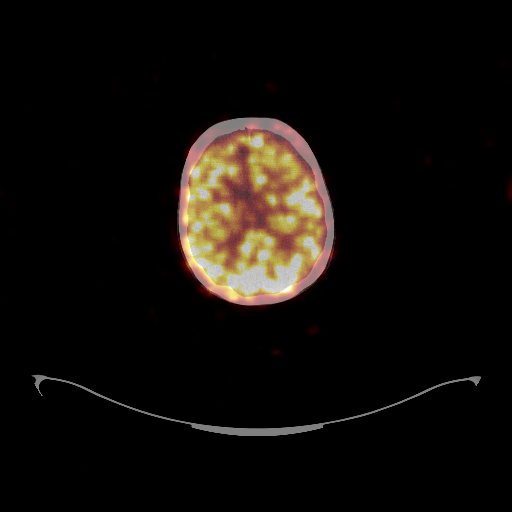
[im 50/197]
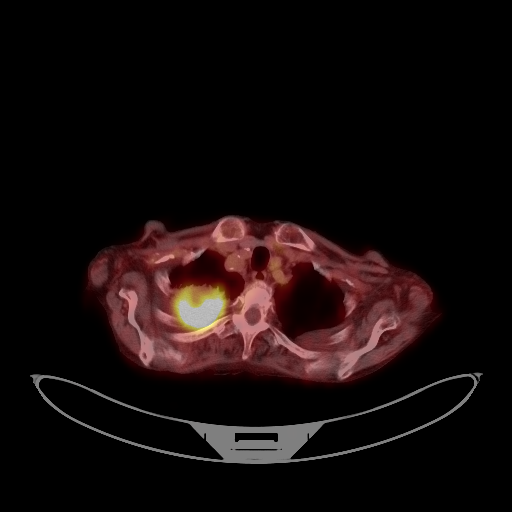
[im 99/197]
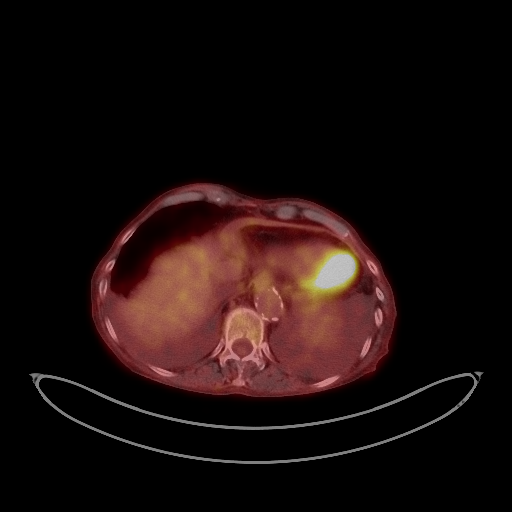
[im 148/197]
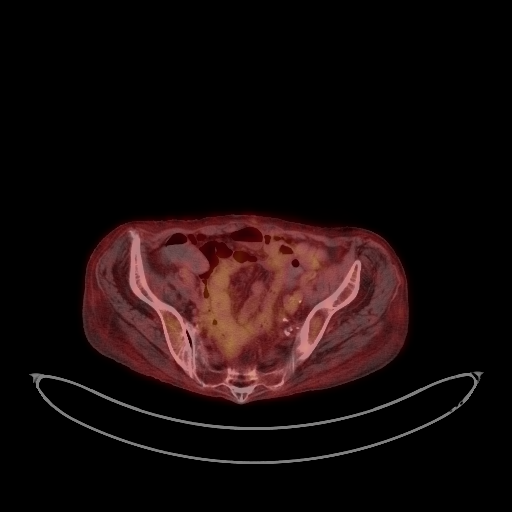
[im 197/197]
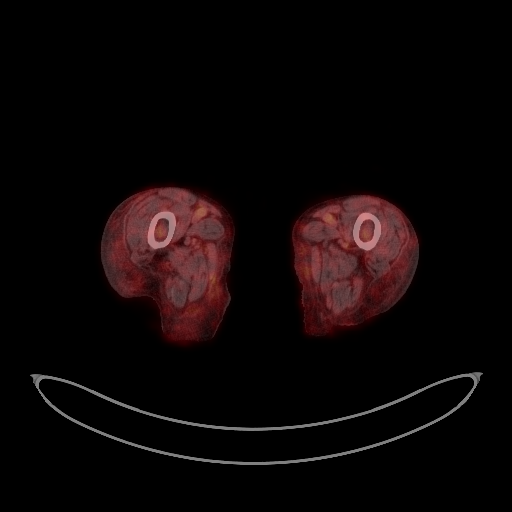

[Series 1157: results mm oncology reading · 1.0mm · 0.89mm/px · 1 of 6 slices shown]
[im 1/6]
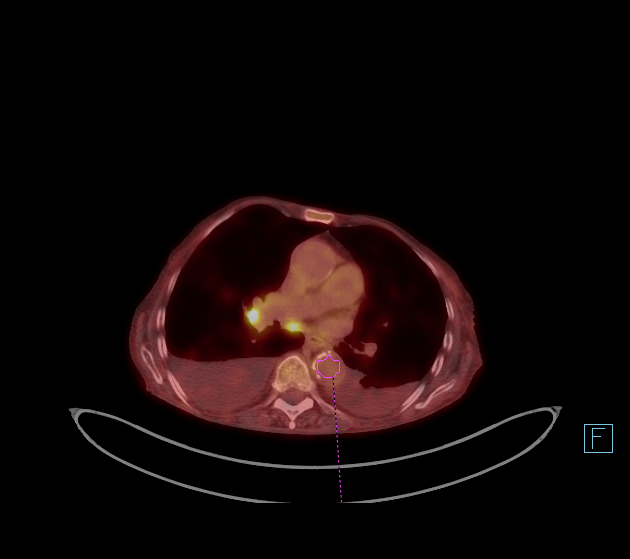

[25 of 25 positions shown; findings below may reference images not displayed]

FINDINGS: Mediastinal blood pool activity: SUV max

NECK: No hypermetabolic cervical lymphadenopathy.

Incidental CT findings: none

CHEST: [DATE] x 7.6 cm posterior right upper lobe mass (series 8/image
14), max SUV 11.7 with suspected central necrosis, compatible with
primary bronchogenic neoplasm.

Dominant 3.9 cm right paratracheal node (series 4/image 71), max SUV
13.9 with suspected central necrosis.

2.6 cm short axis right hilar node, max SUV 17.4.

10 mm short axis subcarinal node, max SUV 5.4.

Small to moderate bilateral pleural effusions, right greater than
left.

Incidental CT findings: Ectasia of the ascending thoracic aorta,
measuring 3.8 cm. Atherosclerotic calcifications of the aortic arch.
Three vessel coronary atherosclerosis. Trace inferior pericardial
fluid.

ABDOMEN/PELVIS: No hypermetabolic abdominopelvic lymphadenopathy.

9 mm soft tissue nodule beneath the anterior abdominal wall (series
4/image 119), nonspecific but without appreciable hypermetabolism.
Focal hypermetabolism beneath the left mid abdominal wall on PET
image 127 is without CT correlate.

No abnormal hypermetabolism in the liver, spleen, pancreas, or right
adrenal gland.

Possible 10 mm left adrenal nodule (series 4/image 113), equivocal.
New hypermetabolism involving the left adrenal gland, max SUV 2.6.
This combination of findings is concerning for early adrenal
metastasis.

Nodular hepatic contour, including an exophytic nodular lesion along
the inferior aspect of the right hepatic lobe (series 4/image 130),
chronic. No appreciable hypermetabolism. Focal hypermetabolism in
the right mid/lower abdomen on PET image 141 is without CT
correlate.

Incidental CT findings: Atherosclerotic calcifications of the
abdominal aorta and branch vessels. Sigmoid diverticulosis, without
evidence of diverticulitis.

SKELETON: No focal hypermetabolic activity to suggest skeletal
metastasis.

Incidental CT findings: Mild sclerosis involving the bilateral
femoral heads.
IMPRESSION: 7.6 cm posterior right upper lobe mass, compatible with primary
bronchogenic neoplasm.

Associated mediastinal and right hilar nodal metastases.

Small to moderate bilateral pleural effusions, right greater than
left.

Suspected 10 mm left adrenal metastasis, although indeterminate.

9 mm soft tissue nodule beneath the anterior abdominal wall,
nonspecific but without appreciable hypermetabolism. Attention on
follow-up is suggested to exclude peritoneal disease.
